# Patient Record
Sex: Male | Born: 1955
Health system: Southern US, Community
[De-identification: ages and names within clinical notes are randomized; demographics above are authoritative.]

## PROBLEM LIST (undated history)

## (undated) DIAGNOSIS — G4730 Sleep apnea, unspecified: Secondary | ICD-10-CM

## (undated) DIAGNOSIS — F32A Depression, unspecified: Secondary | ICD-10-CM

## (undated) DIAGNOSIS — G5603 Carpal tunnel syndrome, bilateral upper limbs: Secondary | ICD-10-CM

## (undated) DIAGNOSIS — M1990 Unspecified osteoarthritis, unspecified site: Secondary | ICD-10-CM

## (undated) DIAGNOSIS — E785 Hyperlipidemia, unspecified: Secondary | ICD-10-CM

## (undated) DIAGNOSIS — I5189 Other ill-defined heart diseases: Secondary | ICD-10-CM

## (undated) DIAGNOSIS — I10 Essential (primary) hypertension: Secondary | ICD-10-CM

## (undated) DIAGNOSIS — M654 Radial styloid tenosynovitis [de Quervain]: Secondary | ICD-10-CM

## (undated) DIAGNOSIS — K219 Gastro-esophageal reflux disease without esophagitis: Secondary | ICD-10-CM

## (undated) DIAGNOSIS — J45909 Unspecified asthma, uncomplicated: Secondary | ICD-10-CM

## (undated) DIAGNOSIS — G5600 Carpal tunnel syndrome, unspecified upper limb: Secondary | ICD-10-CM

## (undated) DIAGNOSIS — J4520 Mild intermittent asthma, uncomplicated: Secondary | ICD-10-CM

## (undated) DIAGNOSIS — I517 Cardiomegaly: Secondary | ICD-10-CM

## (undated) DIAGNOSIS — I6529 Occlusion and stenosis of unspecified carotid artery: Secondary | ICD-10-CM

## (undated) DIAGNOSIS — E119 Type 2 diabetes mellitus without complications: Secondary | ICD-10-CM

## (undated) DIAGNOSIS — I209 Angina pectoris, unspecified: Secondary | ICD-10-CM

## (undated) DIAGNOSIS — G4733 Obstructive sleep apnea (adult) (pediatric): Secondary | ICD-10-CM

## (undated) DIAGNOSIS — I739 Peripheral vascular disease, unspecified: Secondary | ICD-10-CM

## (undated) DIAGNOSIS — M4802 Spinal stenosis, cervical region: Secondary | ICD-10-CM

## (undated) DIAGNOSIS — R011 Cardiac murmur, unspecified: Secondary | ICD-10-CM

## (undated) DIAGNOSIS — I639 Cerebral infarction, unspecified: Secondary | ICD-10-CM

## (undated) DIAGNOSIS — I38 Endocarditis, valve unspecified: Secondary | ICD-10-CM

## (undated) DIAGNOSIS — T7840XA Allergy, unspecified, initial encounter: Secondary | ICD-10-CM

## (undated) DIAGNOSIS — I8390 Asymptomatic varicose veins of unspecified lower extremity: Secondary | ICD-10-CM

## (undated) DIAGNOSIS — F329 Major depressive disorder, single episode, unspecified: Secondary | ICD-10-CM

## (undated) DIAGNOSIS — M5416 Radiculopathy, lumbar region: Secondary | ICD-10-CM

## (undated) HISTORY — DX: Depression, unspecified: F32.A

## (undated) HISTORY — PX: JOINT REPLACEMENT: SHX530

## (undated) HISTORY — DX: Allergy, unspecified, initial encounter: T78.40XA

## (undated) HISTORY — PX: KNEE ARTHROSCOPY WITH MEDIAL MENISECTOMY: SHX5651

## (undated) HISTORY — PX: ENDOVENOUS ABLATION SAPHENOUS VEIN W/ LASER: SUR449

## (undated) HISTORY — DX: Major depressive disorder, single episode, unspecified: F32.9

## (undated) HISTORY — PX: CHOLECYSTECTOMY: SHX55

## (undated) HISTORY — PX: COLONOSCOPY: SHX174

## (undated) HISTORY — DX: Hyperlipidemia, unspecified: E78.5

---

## 1986-02-20 HISTORY — PX: CHOLECYSTECTOMY: SHX55

## 2004-06-11 ENCOUNTER — Emergency Department: Payer: Self-pay | Admitting: Unknown Physician Specialty

## 2005-01-03 ENCOUNTER — Ambulatory Visit: Payer: Self-pay

## 2008-02-21 HISTORY — PX: COLONOSCOPY: SHX174

## 2008-04-16 ENCOUNTER — Ambulatory Visit: Payer: Self-pay | Admitting: Gastroenterology

## 2009-09-17 ENCOUNTER — Ambulatory Visit: Payer: Self-pay

## 2009-10-04 ENCOUNTER — Ambulatory Visit: Payer: Self-pay | Admitting: Unknown Physician Specialty

## 2009-10-05 ENCOUNTER — Ambulatory Visit: Payer: Self-pay | Admitting: Unknown Physician Specialty

## 2009-10-05 HISTORY — PX: CHONDROPLASTY: SHX5177

## 2010-01-19 ENCOUNTER — Ambulatory Visit: Payer: Self-pay | Admitting: Family Medicine

## 2013-04-10 ENCOUNTER — Ambulatory Visit: Payer: Self-pay | Admitting: Internal Medicine

## 2013-04-11 ENCOUNTER — Ambulatory Visit: Payer: Self-pay | Admitting: Internal Medicine

## 2013-08-14 DIAGNOSIS — M1711 Unilateral primary osteoarthritis, right knee: Secondary | ICD-10-CM | POA: Insufficient documentation

## 2013-08-14 DIAGNOSIS — M5116 Intervertebral disc disorders with radiculopathy, lumbar region: Secondary | ICD-10-CM | POA: Insufficient documentation

## 2013-10-31 ENCOUNTER — Ambulatory Visit: Payer: Self-pay | Admitting: General Practice

## 2014-07-21 DIAGNOSIS — M7540 Impingement syndrome of unspecified shoulder: Secondary | ICD-10-CM | POA: Insufficient documentation

## 2014-07-21 DIAGNOSIS — M7520 Bicipital tendinitis, unspecified shoulder: Secondary | ICD-10-CM | POA: Insufficient documentation

## 2014-07-28 ENCOUNTER — Encounter: Payer: Self-pay | Admitting: *Deleted

## 2014-08-10 ENCOUNTER — Ambulatory Visit
Payer: No Typology Code available for payment source | Admitting: Student in an Organized Health Care Education/Training Program

## 2014-08-10 ENCOUNTER — Ambulatory Visit
Admission: RE | Admit: 2014-08-10 | Discharge: 2014-08-10 | Disposition: A | Discharge status: Home / Self Care | Payer: No Typology Code available for payment source | Source: Ambulatory Visit | Attending: Unknown Physician Specialty | Admitting: Unknown Physician Specialty

## 2014-08-10 ENCOUNTER — Encounter
Admission: RE | Disposition: A | Discharge status: Home / Self Care | Payer: Self-pay | Source: Ambulatory Visit | Attending: Unknown Physician Specialty

## 2014-08-10 DIAGNOSIS — I10 Essential (primary) hypertension: Secondary | ICD-10-CM | POA: Diagnosis not present

## 2014-08-10 DIAGNOSIS — G5600 Carpal tunnel syndrome, unspecified upper limb: Secondary | ICD-10-CM | POA: Diagnosis not present

## 2014-08-10 DIAGNOSIS — Z8249 Family history of ischemic heart disease and other diseases of the circulatory system: Secondary | ICD-10-CM | POA: Insufficient documentation

## 2014-08-10 DIAGNOSIS — M7542 Impingement syndrome of left shoulder: Secondary | ICD-10-CM | POA: Insufficient documentation

## 2014-08-10 DIAGNOSIS — G4730 Sleep apnea, unspecified: Secondary | ICD-10-CM | POA: Insufficient documentation

## 2014-08-10 DIAGNOSIS — Z79899 Other long term (current) drug therapy: Secondary | ICD-10-CM | POA: Diagnosis not present

## 2014-08-10 DIAGNOSIS — E119 Type 2 diabetes mellitus without complications: Secondary | ICD-10-CM | POA: Diagnosis not present

## 2014-08-10 DIAGNOSIS — Z9889 Other specified postprocedural states: Secondary | ICD-10-CM | POA: Insufficient documentation

## 2014-08-10 DIAGNOSIS — M19012 Primary osteoarthritis, left shoulder: Secondary | ICD-10-CM | POA: Diagnosis not present

## 2014-08-10 DIAGNOSIS — M25512 Pain in left shoulder: Secondary | ICD-10-CM | POA: Diagnosis present

## 2014-08-10 DIAGNOSIS — M7522 Bicipital tendinitis, left shoulder: Secondary | ICD-10-CM | POA: Diagnosis not present

## 2014-08-10 DIAGNOSIS — K219 Gastro-esophageal reflux disease without esophagitis: Secondary | ICD-10-CM | POA: Diagnosis not present

## 2014-08-10 DIAGNOSIS — Z7951 Long term (current) use of inhaled steroids: Secondary | ICD-10-CM | POA: Insufficient documentation

## 2014-08-10 DIAGNOSIS — Z7982 Long term (current) use of aspirin: Secondary | ICD-10-CM | POA: Diagnosis not present

## 2014-08-10 DIAGNOSIS — J45909 Unspecified asthma, uncomplicated: Secondary | ICD-10-CM | POA: Insufficient documentation

## 2014-08-10 HISTORY — DX: Sleep apnea, unspecified: G47.30

## 2014-08-10 HISTORY — DX: Asymptomatic varicose veins of unspecified lower extremity: I83.90

## 2014-08-10 HISTORY — DX: Type 2 diabetes mellitus without complications: E11.9

## 2014-08-10 HISTORY — DX: Essential (primary) hypertension: I10

## 2014-08-10 HISTORY — DX: Unspecified asthma, uncomplicated: J45.909

## 2014-08-10 HISTORY — DX: Gastro-esophageal reflux disease without esophagitis: K21.9

## 2014-08-10 HISTORY — PX: SHOULDER ARTHROSCOPY: SHX128

## 2014-08-10 HISTORY — DX: Carpal tunnel syndrome, unspecified upper limb: G56.00

## 2014-08-10 LAB — GLUCOSE, CAPILLARY
GLUCOSE-CAPILLARY: 167 mg/dL — AB (ref 65–99)
Glucose-Capillary: 164 mg/dL — ABNORMAL HIGH (ref 65–99)

## 2014-08-10 SURGERY — ARTHROSCOPY, SHOULDER
Anesthesia: General | Laterality: Left | Wound class: Clean

## 2014-08-10 MED ORDER — OXYCODONE HCL 5 MG PO TABS: 5.0000 mg | ORAL_TABLET | Freq: Once | ORAL | Status: DC | PRN

## 2014-08-10 MED ORDER — LACTATED RINGERS IV SOLN: INTRAVENOUS | Status: DC

## 2014-08-10 MED ORDER — ROPIVACAINE HCL 5 MG/ML IJ SOLN
INTRAMUSCULAR | Status: DC | PRN
  Administered 2014-08-10: 40 mL via PERINEURAL

## 2014-08-10 MED ORDER — DEXAMETHASONE SODIUM PHOSPHATE 4 MG/ML IJ SOLN
INTRAMUSCULAR | Status: DC | PRN
  Administered 2014-08-10: 4 mg via INTRAVENOUS
  Administered 2014-08-10: 4 mg via PERINEURAL

## 2014-08-10 MED ORDER — NORCO 5-325 MG PO TABS: 1.0000 | ORAL_TABLET | Freq: Four times a day (QID) | ORAL | Status: DC | PRN

## 2014-08-10 MED ORDER — ACETAMINOPHEN 325 MG PO TABS: 325.0000 mg | ORAL_TABLET | ORAL | Status: DC | PRN

## 2014-08-10 MED ORDER — LIDOCAINE HCL (CARDIAC) 20 MG/ML IV SOLN
INTRAVENOUS | Status: DC | PRN
Start: 2014-08-10 — End: 2014-08-10
  Administered 2014-08-10: 50 mg via INTRATRACHEAL

## 2014-08-10 MED ORDER — FENTANYL CITRATE (PF) 100 MCG/2ML IJ SOLN
INTRAMUSCULAR | Status: DC | PRN
  Administered 2014-08-10: 100 ug via INTRAVENOUS

## 2014-08-10 MED ORDER — ONDANSETRON HCL 4 MG/2ML IJ SOLN: 4.0000 mg | Freq: Once | INTRAMUSCULAR | Status: DC | PRN

## 2014-08-10 MED ORDER — LACTATED RINGERS IV SOLN
INTRAVENOUS | Status: DC
  Administered 2014-08-10 (×2): via INTRAVENOUS

## 2014-08-10 MED ORDER — OXYCODONE HCL 5 MG/5ML PO SOLN: 5.0000 mg | Freq: Once | ORAL | Status: DC | PRN

## 2014-08-10 MED ORDER — GLYCOPYRROLATE 0.2 MG/ML IJ SOLN
INTRAMUSCULAR | Status: DC | PRN
  Administered 2014-08-10: 0.1 mg via INTRAVENOUS

## 2014-08-10 MED ORDER — ONDANSETRON HCL 4 MG/2ML IJ SOLN
INTRAMUSCULAR | Status: DC | PRN
  Administered 2014-08-10: 4 mg via INTRAVENOUS

## 2014-08-10 MED ORDER — CEFAZOLIN SODIUM-DEXTROSE 2-3 GM-% IV SOLR
INTRAVENOUS | Status: DC | PRN
  Administered 2014-08-10: 2 g via INTRAVENOUS

## 2014-08-10 MED ORDER — PROPOFOL 10 MG/ML IV BOLUS
INTRAVENOUS | Status: DC | PRN
  Administered 2014-08-10: 170 mg via INTRAVENOUS

## 2014-08-10 MED ORDER — MIDAZOLAM HCL 5 MG/5ML IJ SOLN
INTRAMUSCULAR | Status: DC | PRN
  Administered 2014-08-10 (×2): 2 mg via INTRAVENOUS

## 2014-08-10 MED ORDER — ACETAMINOPHEN 160 MG/5ML PO SOLN: 325.0000 mg | ORAL | Status: DC | PRN

## 2014-08-10 MED ORDER — LACTATED RINGERS IV SOLN
INTRAVENOUS | Status: DC | PRN
  Administered 2014-08-10: 6000 mL

## 2014-08-10 MED ORDER — FENTANYL CITRATE (PF) 100 MCG/2ML IJ SOLN: 25.0000 ug | INTRAMUSCULAR | Status: DC | PRN

## 2014-08-10 MED ORDER — EPINEPHRINE HCL 1 MG/ML IJ SOLN: INTRAMUSCULAR | Status: DC | PRN

## 2014-08-10 MED ORDER — EPHEDRINE SULFATE 50 MG/ML IJ SOLN
INTRAMUSCULAR | Status: DC | PRN
  Administered 2014-08-10: 5 mg via INTRAVENOUS
  Administered 2014-08-10: 10 mg via INTRAVENOUS
  Administered 2014-08-10: 5 mg via INTRAVENOUS

## 2014-08-10 SURGICAL SUPPLY — 72 items
ADAPTER IRRIG TUBE 2 SPIKE SOL (ADAPTER) ×3 IMPLANT
ARTHROWAND PARAGON T2 (SURGICAL WAND)
BLADE SURG 15 STRL LF DISP TIS (BLADE) IMPLANT
BLADE SURG 15 STRL SS (BLADE)
BUR BR 5.5 12 FLUTE (BURR) IMPLANT
BUR HOODED 3.0 ABRASION (BURR) IMPLANT
BUR RADIUS 4.0X18.5 (BURR) ×3 IMPLANT
BUR RADIUS 5.5 (BURR) IMPLANT
CANNULA 8.5X75 THRED (CANNULA) IMPLANT
CANNULA SHAVER 8MMX76MM (CANNULA) IMPLANT
CAP LOCK ULTRA CANNULA (MISCELLANEOUS) ×3 IMPLANT
CUTTER CANN W/HOLE 4.5 (CUTTER) IMPLANT
CUTTER SLOTTED WHISKER 4.0 (BURR) ×3 IMPLANT
DRAPE STERI 35X30 U-POUCH (DRAPES) ×3 IMPLANT
DURAPREP 26ML APPLICATOR (WOUND CARE) ×3 IMPLANT
GAUZE SPONGE 4X4 12PLY STRL (GAUZE/BANDAGES/DRESSINGS) ×3 IMPLANT
GLOVE BIO SURGEON STRL SZ7.5 (GLOVE) ×3 IMPLANT
GLOVE BIO SURGEON STRL SZ8 (GLOVE) ×3 IMPLANT
GLOVE INDICATOR 8.0 STRL GRN (GLOVE) ×3 IMPLANT
GOWN STRL REIN 2XL LVL4 (GOWN DISPOSABLE) IMPLANT
GOWN STRL REUS W/ TWL LRG LVL3 (GOWN DISPOSABLE) ×1 IMPLANT
GOWN STRL REUS W/TWL LRG LVL3 (GOWN DISPOSABLE) ×2
IV LACTATED RINGER IRRG 3000ML (IV SOLUTION) ×8
IV LR IRRIG 3000ML ARTHROMATIC (IV SOLUTION) ×4 IMPLANT
KIT SHOULDER TRACTION (DRAPES) ×3 IMPLANT
MANIFOLD 4PT FOR NEPTUNE1 (MISCELLANEOUS) ×3 IMPLANT
NEEDLE 18GX1X1/2 (RX/OR ONLY) (NEEDLE) ×3 IMPLANT
NEEDLE MAYO CATGUT SZ 1.5 (NEEDLE)
NEEDLE MAYO CATGUT SZ 2 (NEEDLE) IMPLANT
NEEDLE SPNL 18GX3.5 QUINCKE PK (NEEDLE) IMPLANT
PACK ARTHROSCOPY SHOULDER (MISCELLANEOUS) ×3 IMPLANT
PAD GROUND ADULT SPLIT (MISCELLANEOUS) ×3 IMPLANT
PASSER SUT CAPTURE FIRST (SUTURE) IMPLANT
SET TUBE SUCT SHAVER OUTFL 24K (TUBING) ×3 IMPLANT
SLING ARM M TX990204 (SOFTGOODS) ×3 IMPLANT
SOL PREP PVP 2OZ (MISCELLANEOUS) ×3
SOLUTION PREP PVP 2OZ (MISCELLANEOUS) ×1 IMPLANT
STAPLER SKIN PROX 35W (STAPLE) IMPLANT
SUT ETHIBOND NAB CT1 #1 30IN (SUTURE) IMPLANT
SUT ETHILON 3-0 FS-10 30 BLK (SUTURE) ×3
SUT MAGNUM WIRE 2 (SUTURE) IMPLANT
SUT PDS AB 1 CT1 27 (SUTURE) IMPLANT
SUT PDSII 0 (SUTURE) IMPLANT
SUT PERFECTPASSER WHITE CART (SUTURE) IMPLANT
SUT PROLENE 2 0 CT2 30 (SUTURE) IMPLANT
SUT SMART STITCH CARTRIDGE (SUTURE) IMPLANT
SUT TICRON 2-0 30IN 311381 (SUTURE) IMPLANT
SUT VIC AB 0 CT1 36 (SUTURE) IMPLANT
SUT VIC AB 0 CT2 27 (SUTURE) IMPLANT
SUT VIC AB 2-0 CT1 27 (SUTURE)
SUT VIC AB 2-0 CT1 TAPERPNT 27 (SUTURE) IMPLANT
SUT VIC AB 2-0 CT2 27 (SUTURE) IMPLANT
SUT VIC AB 2-0 SH 27 (SUTURE)
SUT VIC AB 2-0 SH 27XBRD (SUTURE) IMPLANT
SUT VIC AB 3-0 SH 27 (SUTURE)
SUT VIC AB 3-0 SH 27X BRD (SUTURE) IMPLANT
SUTURE EHLN 3-0 FS-10 30 BLK (SUTURE) ×1 IMPLANT
SUTURE MAGNUM WIRE 2X48 BLK (SUTURE) IMPLANT
SYRINGE 10CC LL (SYRINGE) ×3 IMPLANT
TAPE MICROFOAM 4IN (TAPE) ×3 IMPLANT
TUBING ARTHRO INFLOW-ONLY STRL (TUBING) ×3 IMPLANT
WAND 30 DEG SABER W/CORD (SURGICAL WAND) IMPLANT
WAND ARTHRO PARAGON T2 (SURGICAL WAND) IMPLANT
WAND COVAC 50 IFS (MISCELLANEOUS) IMPLANT
WAND COVATOR 20 (MISCELLANEOUS) IMPLANT
WAND HAND CNTRL MULTIVAC 50 (MISCELLANEOUS) IMPLANT
WAND HAND CNTRL MULTIVAC 90 (MISCELLANEOUS) IMPLANT
WAND MEGAVAC 90 (MISCELLANEOUS) ×3 IMPLANT
WAND TENDON TOPAZ 0 ANGL (MISCELLANEOUS) IMPLANT
WAND TOPAZ EPF  WAS Q (MISCELLANEOUS)
WAND TOPAZ EPF WAS Q (MISCELLANEOUS) IMPLANT
WIRE MAGNUM (SUTURE) IMPLANT

## 2014-08-10 NOTE — Anesthesia Preprocedure Evaluation (Signed)
Anesthesia Evaluation  Patient identified by MRN, date of birth, ID band  Reviewed: Allergy & Precautions, H&P , NPO status , Patient's Chart, lab work & pertinent test results  Airway Mallampati: II  TM Distance: >3 FB Neck ROM: full    Dental no notable dental hx.    Pulmonary asthma , sleep apnea ,    Pulmonary exam normal       Cardiovascular hypertension, On Medications Rhythm:regular Rate:Normal     Neuro/Psych    GI/Hepatic GERD-  Medicated,  Endo/Other  diabetes  Renal/GU      Musculoskeletal   Abdominal   Peds  Hematology   Anesthesia Other Findings   Reproductive/Obstetrics                             Anesthesia Physical Anesthesia Plan  ASA: II  Anesthesia Plan: General LMA   Post-op Pain Management: MAC Combined w/ Regional for Post-op pain   Induction:   Airway Management Planned:   Additional Equipment:   Intra-op Plan:   Post-operative Plan:   Informed Consent: I have reviewed the patients History and Physical, chart, labs and discussed the procedure including the risks, benefits and alternatives for the proposed anesthesia with the patient or authorized representative who has indicated his/her understanding and acceptance.     Plan Discussed with: CRNA  Anesthesia Plan Comments:         Anesthesia Quick Evaluation

## 2014-08-10 NOTE — Op Note (Signed)
08/10/2014  11:45 AM  Patient:   Raymond [name]  Pre-Op Diagnosis:   M75.22 BICEPITAL TENDONITIS LEFT SHOULDER M75.42 IMPINGEMENT SYNDROME LEFT SHOULDER  Postoperative diagnosis: degenerative arthritis, biceps tendinopathy and loose body left shoulder.  Procedure:  Arthroscopic debridement of chondral lesions left shoulder along with arthroscopic removal of loose body and arthroscopic release of the long head of the biceps tendon  Anesthesia: General endotracheal with interscalene block placed preoperatively by the anesthesiologist.  Findings: As above.  Complications: None  Estimated blood loss: negligable  Tourniquet time: None  Drains: None  Closure: sutures   Brief clinical note:  The patient's symptoms have progressed despite medications, activity modification, etc. The patient's history and examination are consistent with  biceps tendinopathy with possible rotator cuff tear. These findings were confirmed by MRI scan. The patient presents at this time for definitive management of these shoulder symptoms.  Procedure: The patient was brought into the operating room and placed in the supine position. The patient then underwent general endotracheal intubation and anesthesia before being repositioned in the lateral decubitus position. The  left shoulder and upper extremity were prepped and draped in usual fashion. 2 g of Kefzol  was administered IV. A timeout was performed . A posterior portal was created and the glenohumeral joint thoroughly inspected revealing  Grade 3 glenohumeral chondral changes along with a loose body and fraying of the long head of the biceps tendon. An anterior portal was created. The labrum and rotator cuff were further probed. The ArthroCare wand was inserted and used to obtain hemostasis as well as to perform a limited synovectomy.The biceps tendon was released from the labrum using an ArthroCare wand.  The small loose body was removed  from the glenohumeral joint.  I then debrided the glenohumeral chondral changes with a turbo whisker.  The scope was repositioned through the posterior portal into the subacromial space. A separate lateral portal was created using an outside-in technique. The 3.5 full-radius resector was introduced and used to perform a subtotal bursectomy. The ArthroCare wand was then inserted and used to further debride the subacromial space. I carefully inspected the rotator cuff with a probe. No obvious rotator cuff tear visualized. No significant anterior subacromial spurring was observed.. At this time the instruments were  removed from the subacromial space.   The portal sites also were closed using 4-O nylon sutures. A sterile bulky dressing was applied to the shoulder followed by a sling.The patient was then awakened, extubated, and returned to the recovery room in satisfactory condition after tolerating the procedure well.  Blood loss was negligible.

## 2014-08-10 NOTE — Discharge Instructions (Signed)
General Anesthesia, Care After Refer to this sheet in the next few weeks. These instructions provide you with information on caring for yourself after your procedure. Your health care provider may also give you more specific instructions. Your treatment has been planned according to current medical practices, but problems sometimes occur. Call your health care provider if you have any problems or questions after your procedure. WHAT TO EXPECT AFTER THE PROCEDURE After the procedure, it is typical to experience:  Sleepiness.  Nausea and vomiting. HOME CARE INSTRUCTIONS  For the first 24 hours after general anesthesia:  Have a responsible person with you.  Do not drive a car. If you are alone, do not take public transportation.  Do not drink alcohol.  Do not take medicine that has not been prescribed by your health care provider.  Do not sign important papers or make important decisions.  You may resume a normal diet and activities as directed by your health care provider.  Change bandages (dressings) as directed.  If you have questions or problems that seem related to general anesthesia, call the hospital and ask for the anesthetist or anesthesiologist on call. SEEK MEDICAL CARE IF:  You have nausea and vomiting that continue the day after anesthesia.  You develop a rash. SEEK IMMEDIATE MEDICAL CARE IF:   You have difficulty breathing.  You have chest pain.  You have any allergic problems. Document Released: 05/15/2000 Document Revised: 02/11/2013 Document Reviewed: 08/22/2012 [facility] Patient Information 2015 [place], [place]. This information is not intended to replace advice given to you by your health care provider. Make sure you discuss any questions you have with your health care provider. General Anesthesia, Care After Refer to this sheet in the next few weeks. These instructions provide you with information on caring for yourself after your procedure. Your health care  provider may also give you more specific instructions. Your treatment has been planned according to current medical practices, but problems sometimes occur. Call your health care provider if you have any problems or questions after your procedure. WHAT TO EXPECT AFTER THE PROCEDURE After the procedure, it is typical to experience:  Sleepiness.  Nausea and vomiting. HOME CARE INSTRUCTIONS  For the first 24 hours after general anesthesia:  Have a responsible person with you.  Do not drive a car. If you are alone, do not take public transportation.  Do not drink alcohol.  Do not take medicine that has not been prescribed by your health care provider.  Do not sign important papers or make important decisions.  You may resume a normal diet and activities as directed by your health care provider.  Change bandages (dressings) as directed.  If you have questions or problems that seem related to general anesthesia, call the hospital and ask for the anesthetist or anesthesiologist on call. SEEK MEDICAL CARE IF:  You have nausea and vomiting that continue the day after anesthesia.  You develop a rash. SEEK IMMEDIATE MEDICAL CARE IF:   You have difficulty breathing.  You have chest pain.  You have any allergic problems.  Document Released: 05/15/2000 Document Revised: 02/11/2013 Document Reviewed: 08/22/2012 [facility] Patient Information 2015 [place], [place]. This information is not intended to replace advice given to you by your health care provider. Make sure you discuss any questions you have with your health care provider. Diet: As you were doing prior to hospitalization   Shower:  May shower but keep the wounds dry, use an occlusive plastic wrap or extremity protector. NO SOAKING IN  TUB.   Dressing:  You may remove your dressing 3 days after surgery. Then apply waterproof bandaids and change them after showering.   Activity:  Increase activity slowly as tolerated. Can drive  when comfortable.    Sling: D/C when comfortable    RTC: 1 week  Ice pack as needed   To prevent constipation: you may use a stool softener such as - Miralax (over the counter) for constipation as needed.    To prevent venous clotting Take one 81 mg. ASA tablet  2X per day for about 2 weeks post surgery.  Precautions:  If you experience chest pain or shortness of breath - call 911 immediately for transfer to the hospital emergency department!!  If you develop a fever greater that 101 F, purulent drainage from wound, increased redness or drainage from wound, or calf pain -- Call the office at [phone]                                             Follow- Up Appointment:  Please call for an appointment to be seen in 1 wk.

## 2014-08-10 NOTE — Transfer of Care (Signed)
Immediate Anesthesia Transfer of Care Note  Patient: ERCELL RAZON  Procedure(s) Performed: Procedure(s): ARTHROSCOPY SHOULDER WITH LIMITED DEBRIDEMENT, REMOVAL OF LOOSE BODY AND RELEASE OF LONG END BICEPS TENDON (Left)  Patient Location: PACU  Anesthesia Type: General LMA  Level of Consciousness: awake, alert  and patient cooperative  Airway and Oxygen Therapy: Patient Spontanous Breathing and Patient connected to supplemental oxygen  Post-op Assessment: Post-op Vital signs reviewed, Patient's Cardiovascular Status Stable, Respiratory Function Stable, Patent Airway and No signs of Nausea or vomiting  Post-op Vital Signs: Reviewed and stable  Complications: No apparent anesthesia complications

## 2014-08-10 NOTE — Anesthesia Postprocedure Evaluation (Signed)
  Anesthesia Post-op Note  Patient: Raymond [name]  Procedure(s) Performed: Procedure(s): ARTHROSCOPY SHOULDER WITH LIMITED DEBRIDEMENT, REMOVAL OF LOOSE BODY AND RELEASE OF LONG END BICEPS TENDON (Left)  Anesthesia type:General LMA  Patient location: PACU  Post pain: Pain level controlled  Post assessment: Post-op Vital signs reviewed, Patient's Cardiovascular Status Stable, Respiratory Function Stable, Patent Airway and No signs of Nausea or vomiting  Post vital signs: Reviewed and stable  Last Vitals:  Filed Vitals:   08/10/14 1145  BP:   Pulse: 65  Temp: 36.7 C  Resp: 13    Level of consciousness: awake, alert  and patient cooperative  Complications: No apparent anesthesia complications

## 2014-08-10 NOTE — H&P (Signed)
  H and P reviewed. No changes. Uploaded at later date. 

## 2014-08-10 NOTE — Anesthesia Procedure Notes (Addendum)
Anesthesia Regional Block:  Interscalene brachial plexus block  Pre-Anesthetic Checklist: ,, timeout performed, Correct Patient, Correct Site, Correct Laterality, Correct Procedure, Correct Position, site marked, Risks and benefits discussed,  Surgical consent,  Pre-op evaluation,  At surgeon's request and post-op pain management  Laterality: Left  Prep: chloraprep       Needles:  Injection technique: Single-shot  Needle Type: Stimiplex     Needle Length: 10cm 10 cm Needle Gauge: 21 and 21 G    Additional Needles:  Procedures: ultrasound guided (picture in chart) Interscalene brachial plexus block Narrative:  Start time: 08/10/2014 8:51 AM End time: 08/10/2014 8:58 AM Injection made incrementally with aspirations every 5 mL.  Performed by: Personally  Anesthesiologist: Ronelle Nigh  Additional Notes: Functioning IV was confirmed and monitors applied. Ultrasound guidance: relevant anatomy identified, needle position confirmed, local anesthetic spread visualized around nerve(s)., vascular puncture avoided.  Image printed for medical record.  Negative aspiration and no paresthesias; incremental administration of local anesthetic. The patient tolerated the procedure well. Vitals signes recorded in RN notes.   Procedure Name: LMA Insertion Date/Time: 08/10/2014 9:42 AM Performed by: Mayme Genta Pre-anesthesia Checklist: Patient identified, Emergency Drugs available, Suction available, Timeout performed and Patient being monitored Patient Re-evaluated:Patient Re-evaluated prior to inductionOxygen Delivery Method: Circle system utilized Preoxygenation: Pre-oxygenation with 100% oxygen Intubation Type: IV induction LMA: LMA inserted LMA Size: 4.0 Number of attempts: 1 Placement Confirmation: positive ETCO2 and breath sounds checked- equal and bilateral Tube secured with: Tape

## 2014-08-10 NOTE — Progress Notes (Signed)
Assisted Mike Stella ANMD with left, interscalene  block. Side rails up, monitors on throughout procedure. See vital signs in flow sheet. Tolerated Procedure well.  

## 2014-08-11 ENCOUNTER — Encounter: Payer: Self-pay | Admitting: Unknown Physician Specialty

## 2014-12-02 ENCOUNTER — Other Ambulatory Visit: Payer: Self-pay | Admitting: Family Medicine

## 2014-12-02 DIAGNOSIS — G629 Polyneuropathy, unspecified: Secondary | ICD-10-CM

## 2014-12-02 DIAGNOSIS — I10 Essential (primary) hypertension: Secondary | ICD-10-CM

## 2014-12-03 ENCOUNTER — Other Ambulatory Visit: Payer: Self-pay

## 2014-12-03 MED ORDER — LIRAGLUTIDE 18 MG/3ML ~~LOC~~ SOPN: 1.8000 mL | PEN_INJECTOR | SUBCUTANEOUS | Status: DC

## 2014-12-04 ENCOUNTER — Encounter: Payer: Self-pay | Admitting: Family Medicine

## 2014-12-04 ENCOUNTER — Ambulatory Visit (INDEPENDENT_AMBULATORY_CARE_PROVIDER_SITE_OTHER): Payer: No Typology Code available for payment source | Admitting: Family Medicine

## 2014-12-04 ENCOUNTER — Ambulatory Visit
Admission: RE | Admit: 2014-12-04 | Discharge: 2014-12-04 | Disposition: A | Discharge status: Home / Self Care | Payer: PRIVATE HEALTH INSURANCE | Source: Ambulatory Visit | Attending: Family Medicine | Admitting: Family Medicine

## 2014-12-04 VITALS — BP 140/80 | HR 68 | Ht 73.0 in | Wt 292.0 lb

## 2014-12-04 DIAGNOSIS — F3341 Major depressive disorder, recurrent, in partial remission: Secondary | ICD-10-CM

## 2014-12-04 DIAGNOSIS — E119 Type 2 diabetes mellitus without complications: Secondary | ICD-10-CM

## 2014-12-04 DIAGNOSIS — E785 Hyperlipidemia, unspecified: Secondary | ICD-10-CM | POA: Diagnosis not present

## 2014-12-04 DIAGNOSIS — S76012A Strain of muscle, fascia and tendon of left hip, initial encounter: Secondary | ICD-10-CM | POA: Insufficient documentation

## 2014-12-04 DIAGNOSIS — K219 Gastro-esophageal reflux disease without esophagitis: Secondary | ICD-10-CM | POA: Diagnosis not present

## 2014-12-04 DIAGNOSIS — I10 Essential (primary) hypertension: Secondary | ICD-10-CM

## 2014-12-04 DIAGNOSIS — X58XXXA Exposure to other specified factors, initial encounter: Secondary | ICD-10-CM | POA: Insufficient documentation

## 2014-12-04 DIAGNOSIS — M5116 Intervertebral disc disorders with radiculopathy, lumbar region: Secondary | ICD-10-CM | POA: Diagnosis not present

## 2014-12-04 DIAGNOSIS — J301 Allergic rhinitis due to pollen: Secondary | ICD-10-CM

## 2014-12-04 DIAGNOSIS — G629 Polyneuropathy, unspecified: Secondary | ICD-10-CM | POA: Diagnosis not present

## 2014-12-04 MED ORDER — GABAPENTIN 300 MG PO CAPS: ORAL_CAPSULE | ORAL | Status: DC

## 2014-12-04 MED ORDER — HYDROCHLOROTHIAZIDE 25 MG PO TABS: 25.0000 mg | ORAL_TABLET | Freq: Every day | ORAL | Status: DC

## 2014-12-04 MED ORDER — LIRAGLUTIDE 18 MG/3ML ~~LOC~~ SOPN: 1.8000 mL | PEN_INJECTOR | SUBCUTANEOUS | Status: DC

## 2014-12-04 MED ORDER — BUPROPION HCL 75 MG PO TABS: 75.0000 mg | ORAL_TABLET | Freq: Every day | ORAL | Status: DC

## 2014-12-04 MED ORDER — SIMVASTATIN 40 MG PO TABS: 40.0000 mg | ORAL_TABLET | Freq: Every day | ORAL | Status: DC

## 2014-12-04 MED ORDER — AMLODIPINE BESYLATE 10 MG PO TABS: 10.0000 mg | ORAL_TABLET | Freq: Every day | ORAL | Status: DC

## 2014-12-04 MED ORDER — ESCITALOPRAM OXALATE 20 MG PO TABS: 20.0000 mg | ORAL_TABLET | Freq: Every day | ORAL | Status: DC

## 2014-12-04 MED ORDER — FLUTICASONE PROPIONATE 50 MCG/ACT NA SUSP: 2.0000 | Freq: Every day | NASAL | Status: DC

## 2014-12-04 MED ORDER — LOSARTAN POTASSIUM 50 MG PO TABS: ORAL_TABLET | ORAL | Status: DC

## 2014-12-04 MED ORDER — ESOMEPRAZOLE MAGNESIUM 40 MG PO CPDR: 40.0000 mg | DELAYED_RELEASE_CAPSULE | Freq: Every day | ORAL | Status: DC

## 2014-12-04 MED ORDER — METFORMIN HCL 500 MG PO TABS
500.0000 mg | ORAL_TABLET | Freq: Two times a day (BID) | ORAL | Status: DC
Start: 2014-12-04 — End: 2015-06-01

## 2014-12-04 NOTE — Progress Notes (Signed)
Name: Raymond [name]   MRN: [id number]    DOB: 10-Sep-1955   Date:12/04/2014       Progress Note  Subjective  Chief Complaint  Chief Complaint  Patient presents with  . Hypertension  . Hyperlipidemia  . Gastrophageal Reflux  . Depression  . Diabetes  . Hip Pain    Fell approx 1 week ago- L) hip pain  . Allergic Rhinitis   . Peripheral Neuropathy    Hypertension This is a chronic problem. The current episode started more than 1 year ago. The problem has been waxing and waning since onset. The problem is controlled. Associated symptoms include malaise/fatigue. Pertinent negatives include no anxiety, blurred vision, chest pain, headaches, neck pain, orthopnea, palpitations, peripheral edema, PND, shortness of breath or sweats. There are no known risk factors for coronary artery disease. Past treatments include calcium channel blockers, diuretics and angiotensin blockers. There are no compliance problems.  There is no history of angina, kidney disease, CAD/MI, CVA, heart failure, left ventricular hypertrophy, PVD, renovascular disease or retinopathy. There is no history of chronic renal disease.  Hyperlipidemia This is a chronic problem. The current episode started more than 1 year ago. The problem is controlled. Recent lipid tests were reviewed and are normal. He has no history of chronic renal disease, diabetes, hypothyroidism, liver disease, obesity or nephrotic syndrome. There are no known factors aggravating his hyperlipidemia. Pertinent negatives include no chest pain, focal sensory loss, focal weakness, leg pain, myalgias or shortness of breath. He is currently on no antihyperlipidemic treatment. The current treatment provides mild improvement of lipids. There are no compliance problems.  Risk factors for coronary artery disease include obesity, male sex, hypertension, dyslipidemia and diabetes mellitus.  Gastrophageal Reflux He reports no abdominal pain, no belching, no chest pain, no  choking, no coughing, no dysphagia, no early satiety, no globus sensation, no heartburn, no hoarse voice, no nausea, no sore throat, no stridor, no water brash or no wheezing. This is a chronic problem. The current episode started more than 1 year ago. The problem has been waxing and waning. The symptoms are aggravated by certain foods. Pertinent negatives include no anemia, fatigue, melena, muscle weakness, orthopnea or weight loss. There are no known risk factors. He has tried a PPI for the symptoms. The treatment provided mild relief.  Depression        This is a chronic problem.  The current episode started more than 1 year ago.   The onset quality is gradual.   The problem occurs intermittently.  The problem has been gradually improving since onset.  Associated symptoms include sad.  Associated symptoms include no decreased concentration, no fatigue, no helplessness, no hopelessness, does not have insomnia, not irritable, no appetite change, no myalgias, no headaches and no suicidal ideas.     The symptoms are aggravated by nothing.  Past treatments include SSRIs - Selective serotonin reuptake inhibitors (buptropion anxiety).  Compliance with treatment is good.  Past compliance problems include difficulty understanding directions.  Previous treatment provided mild relief.   Pertinent negatives include no hypothyroidism and no anxiety. Diabetes He presents for his follow-up diabetic visit. He has type 2 diabetes mellitus. His disease course has been fluctuating. Pertinent negatives for hypoglycemia include no confusion, dizziness, headaches, hunger, mood changes, nervousness/anxiousness, pallor, sleepiness, speech difficulty, sweats or tremors. Pertinent negatives for diabetes include no blurred vision, no chest pain, no fatigue, no foot paresthesias, no foot ulcerations, no polydipsia, no polyphagia, no polyuria, no visual change, no weakness  and no weight loss. Symptoms are stable. There are no diabetic  complications. Pertinent negatives for diabetic complications include no CVA, PVD or retinopathy. Risk factors for coronary artery disease include obesity. Current diabetic treatment includes oral agent (dual therapy). His weight is increasing steadily. He is following a generally healthy diet. He participates in exercise daily. His home blood glucose trend is fluctuating minimally. An ACE inhibitor/angiotensin II receptor blocker is being taken.  Hip Pain  The incident occurred 5 to 7 days ago. The incident occurred in the yard. The injury mechanism was a fall. The pain is present in the left hip. The quality of the pain is described as aching. The pain is mild. The pain has been fluctuating since onset. Associated symptoms include a loss of motion. Pertinent negatives include no inability to bear weight, loss of sensation, muscle weakness, numbness or tingling. He has tried acetaminophen for the symptoms. The treatment provided mild relief.    No problem-specific assessment & plan notes found for this encounter.   Past Medical History  Diagnosis Date  . Sleep apnea     has BiPAP -doesn't use  . Asthma     mild  . Diabetes mellitus without complication ([place])     Type II  . Hypertension     neg stress test 10 yrs ago - Dr Humphrey Rolls  . Varicose vein     treated by Dr. Delana Meyer  . GERD (gastroesophageal reflux disease)   . Carpal tunnel syndrome     bilateral  . Depression   . Allergy   . Hyperlipidemia     Past Surgical History  Procedure Laterality Date  . Cholecystectomy    . Knee arthroscopy with medial menisectomy Right     Partial medial and lateral menisectomy  . Chondroplasty  10/05/09    knee- shaving patellofemoral joint  . Endovenous ablation saphenous vein w/ laser    . Shoulder arthroscopy Left 08/10/2014    Procedure: ARTHROSCOPY SHOULDER WITH LIMITED DEBRIDEMENT, REMOVAL OF LOOSE BODY AND RELEASE OF LONG END BICEPS TENDON;  Surgeon: Leanor Kail, MD;  Location: [place];  Service: Orthopedics;  Laterality: Left;  . Colonoscopy  2009?    History reviewed. No pertinent family history.  Social History   Social History  . Marital Status: Married    Spouse Name: N/A  . Number of Children: N/A  . Years of Education: N/A   Occupational History  . Not on file.   Social History Main Topics  . Smoking status: Never Smoker   . Smokeless tobacco: Not on file  . Alcohol Use: Yes     Comment: rare  . Drug Use: Not on file  . Sexual Activity: Yes   Other Topics Concern  . Not on file   Social History Narrative    No Known Allergies   Review of Systems  Constitutional: Positive for malaise/fatigue. Negative for fever, chills, weight loss, appetite change and fatigue.  HENT: Negative for ear discharge, ear pain, hoarse voice and sore throat.   Eyes: Negative for blurred vision.  Respiratory: Negative for cough, sputum production, choking, shortness of breath and wheezing.   Cardiovascular: Negative for chest pain, palpitations, orthopnea, leg swelling and PND.  Gastrointestinal: Negative for heartburn, dysphagia, nausea, abdominal pain, diarrhea, constipation, blood in stool and melena.  Genitourinary: Negative for dysuria, urgency, frequency and hematuria.  Musculoskeletal: Positive for joint pain. Negative for myalgias, back pain, muscle weakness and neck pain.  Skin: Negative for pallor and rash.  Neurological: Negative for dizziness, tingling, tremors, sensory change, focal weakness, speech difficulty, weakness, numbness and headaches.  Endo/Heme/Allergies: Negative for environmental allergies, polydipsia and polyphagia. Does not bruise/bleed easily.  Psychiatric/Behavioral: Positive for depression. Negative for suicidal ideas, confusion and decreased concentration. The patient is not nervous/anxious and does not have insomnia.      Objective  Filed Vitals:   12/04/14 0805  BP: 140/80  Pulse: 68  Height: 6\' 1"  (1.854 m)   Weight: 292 lb (132.45 kg)    Physical Exam  Constitutional: He is oriented to person, place, and time and well-developed, well-nourished, and in no distress. He is not irritable.  HENT:  Head: Normocephalic.  Right Ear: External ear normal.  Left Ear: External ear normal.  Nose: Nose normal.  Mouth/Throat: Oropharynx is clear and moist.  Eyes: Conjunctivae and EOM are normal. Pupils are equal, round, and reactive to light. Right eye exhibits no discharge. Left eye exhibits no discharge. No scleral icterus.  Neck: Normal range of motion. Neck supple. No JVD present. No tracheal deviation present. No thyromegaly present.  Cardiovascular: Normal rate, regular rhythm, normal heart sounds and intact distal pulses.  Exam reveals no gallop and no friction rub.   No murmur heard. Pulmonary/Chest: Breath sounds normal. No respiratory distress. He has no wheezes. He has no rales.  Abdominal: Soft. Bowel sounds are normal. He exhibits no mass. There is no hepatosplenomegaly. There is no tenderness. There is no rebound, no guarding and no CVA tenderness.  Musculoskeletal: Normal range of motion. He exhibits tenderness. He exhibits no edema.  Left hip  Lymphadenopathy:    He has no cervical adenopathy.  Neurological: He is alert and oriented to person, place, and time. He has normal sensation, normal strength, normal reflexes and intact cranial nerves. No cranial nerve deficit.  Skin: Skin is warm. No rash noted.  Psychiatric: Mood and affect normal.      Assessment & Plan  Problem List Items Addressed This Visit      Digestive   GERD (gastroesophageal reflux disease)   Relevant Medications   esomeprazole (NEXIUM) 40 MG capsule     Nervous and Auditory   Neuritis or radiculitis due to rupture of lumbar intervertebral disc   Relevant Medications   buPROPion (WELLBUTRIN) 75 MG tablet   escitalopram (LEXAPRO) 20 MG tablet   gabapentin (NEURONTIN) 300 MG capsule    Other Visit Diagnoses     Essential hypertension    -  Primary    Relevant Medications    amLODipine (NORVASC) 10 MG tablet    hydrochlorothiazide (HYDRODIURIL) 25 MG tablet    losartan (COZAAR) 50 MG tablet    simvastatin (ZOCOR) 40 MG tablet    Other Relevant Orders    Renal Function Panel    Lipid Profile    Hyperlipidemia        Relevant Medications    amLODipine (NORVASC) 10 MG tablet    hydrochlorothiazide (HYDRODIURIL) 25 MG tablet    losartan (COZAAR) 50 MG tablet    simvastatin (ZOCOR) 40 MG tablet    Other Relevant Orders    Lipid Profile    Recurrent major depressive disorder, in partial remission (HCC)        Relevant Medications    buPROPion (WELLBUTRIN) 75 MG tablet    escitalopram (LEXAPRO) 20 MG tablet    Allergic rhinitis due to pollen        Relevant Medications    fluticasone (FLONASE) 50 MCG/ACT nasal spray    Hip strain,  left, initial encounter        Relevant Orders    DG HIP UNILAT WITH PELVIS 2-3 VIEWS LEFT (Completed)    Neuropathy (HCC)        Relevant Medications    gabapentin (NEURONTIN) 300 MG capsule    Type 2 diabetes mellitus without complication, unspecified long term insulin use status (HCC)        Relevant Medications    losartan (COZAAR) 50 MG tablet    metFORMIN (GLUCOPHAGE) 500 MG tablet    simvastatin (ZOCOR) 40 MG tablet    Liraglutide 18 MG/3ML SOPN         Dr. Deanna Jones [facility]  12/04/2014

## 2014-12-05 LAB — LIPID PANEL
Chol/HDL Ratio: 4 ratio units (ref 0.0–5.0)
Cholesterol, Total: 187 mg/dL (ref 100–199)
HDL: 47 mg/dL (ref >39)
LDL Calculated: 110 mg/dL — ABNORMAL HIGH (ref 0–99)
TRIGLYCERIDES: 149 mg/dL (ref 0–149)
VLDL Cholesterol Cal: 30 mg/dL (ref 5–40)

## 2014-12-05 LAB — RENAL FUNCTION PANEL
ALBUMIN: 4.3 g/dL (ref 3.6–4.8)
BUN/Creatinine Ratio: 17 (ref 10–22)
BUN: 13 mg/dL (ref 8–27)
CALCIUM: 9.5 mg/dL (ref 8.6–10.2)
CHLORIDE: 94 mmol/L — AB (ref 97–108)
CO2: 29 mmol/L (ref 18–29)
Creatinine, Ser: 0.76 mg/dL (ref 0.76–1.27)
GFR calc Af Amer: 115 mL/min/{1.73_m2} (ref >59)
GFR calc non Af Amer: 99 mL/min/{1.73_m2} (ref >59)
GLUCOSE: 180 mg/dL — AB (ref 65–99)
PHOSPHORUS: 3.3 mg/dL (ref 2.5–4.5)
POTASSIUM: 3.8 mmol/L (ref 3.5–5.2)
Sodium: 139 mmol/L (ref 134–144)

## 2014-12-18 ENCOUNTER — Other Ambulatory Visit: Payer: Self-pay

## 2014-12-21 ENCOUNTER — Other Ambulatory Visit: Payer: Self-pay

## 2014-12-21 DIAGNOSIS — E119 Type 2 diabetes mellitus without complications: Secondary | ICD-10-CM

## 2014-12-21 MED ORDER — LIRAGLUTIDE 18 MG/3ML ~~LOC~~ SOPN: 1.8000 mL | PEN_INJECTOR | SUBCUTANEOUS | Status: DC

## 2015-01-05 ENCOUNTER — Other Ambulatory Visit: Payer: Self-pay

## 2015-03-29 ENCOUNTER — Other Ambulatory Visit: Payer: Self-pay

## 2015-05-27 ENCOUNTER — Ambulatory Visit: Payer: Self-pay | Admitting: Physician Assistant

## 2015-05-27 ENCOUNTER — Encounter: Payer: Self-pay | Admitting: Physician Assistant

## 2015-05-27 VITALS — BP 140/76 | HR 74 | Temp 98.4°F

## 2015-05-27 DIAGNOSIS — L237 Allergic contact dermatitis due to plants, except food: Secondary | ICD-10-CM

## 2015-05-27 MED ORDER — DEXAMETHASONE SODIUM PHOSPHATE 10 MG/ML IJ SOLN
10.0000 mg | Freq: Once | INTRAMUSCULAR | Status: AC
  Administered 2015-05-27: 10 mg via INTRAMUSCULAR

## 2015-05-27 NOTE — Progress Notes (Signed)
S: was outside weed eating, now has poison ivy on ankles, hands, and forearm, no drainage from sites, no fever/chills, remainder ros neg  O: vitals wnl, nad, skin with small blisters on hand, ankles, and forearm, some in streaks typical of poison ivy, n/v intact  A: acute contact dermatitis, poison ivy  P: decadron 10mg  im given in clinic, if rash returns will rx prednisone

## 2015-06-01 ENCOUNTER — Other Ambulatory Visit: Payer: Self-pay | Admitting: Family Medicine

## 2015-07-02 ENCOUNTER — Other Ambulatory Visit: Payer: Self-pay

## 2015-07-02 DIAGNOSIS — E119 Type 2 diabetes mellitus without complications: Secondary | ICD-10-CM

## 2015-07-02 MED ORDER — LIRAGLUTIDE 18 MG/3ML ~~LOC~~ SOPN: 10.8000 mg | PEN_INJECTOR | SUBCUTANEOUS | Status: DC

## 2015-07-21 ENCOUNTER — Encounter: Payer: Self-pay | Admitting: Family Medicine

## 2015-07-21 ENCOUNTER — Ambulatory Visit (INDEPENDENT_AMBULATORY_CARE_PROVIDER_SITE_OTHER): Payer: Managed Care, Other (non HMO) | Admitting: Family Medicine

## 2015-07-21 VITALS — BP 130/82 | HR 60 | Ht 73.0 in | Wt 293.0 lb

## 2015-07-21 DIAGNOSIS — G6189 Other inflammatory polyneuropathies: Secondary | ICD-10-CM | POA: Diagnosis not present

## 2015-07-21 DIAGNOSIS — F329 Major depressive disorder, single episode, unspecified: Secondary | ICD-10-CM

## 2015-07-21 DIAGNOSIS — K219 Gastro-esophageal reflux disease without esophagitis: Secondary | ICD-10-CM

## 2015-07-21 DIAGNOSIS — J301 Allergic rhinitis due to pollen: Secondary | ICD-10-CM | POA: Diagnosis not present

## 2015-07-21 DIAGNOSIS — E782 Mixed hyperlipidemia: Secondary | ICD-10-CM | POA: Insufficient documentation

## 2015-07-21 DIAGNOSIS — I10 Essential (primary) hypertension: Secondary | ICD-10-CM | POA: Diagnosis not present

## 2015-07-21 DIAGNOSIS — E785 Hyperlipidemia, unspecified: Secondary | ICD-10-CM

## 2015-07-21 DIAGNOSIS — M170 Bilateral primary osteoarthritis of knee: Secondary | ICD-10-CM

## 2015-07-21 DIAGNOSIS — E119 Type 2 diabetes mellitus without complications: Secondary | ICD-10-CM

## 2015-07-21 DIAGNOSIS — Z0000 Encounter for general adult medical examination without abnormal findings: Secondary | ICD-10-CM

## 2015-07-21 DIAGNOSIS — F32A Depression, unspecified: Secondary | ICD-10-CM | POA: Insufficient documentation

## 2015-07-21 MED ORDER — AMLODIPINE BESYLATE 10 MG PO TABS: ORAL_TABLET | ORAL | Status: DC

## 2015-07-21 MED ORDER — LOSARTAN POTASSIUM 50 MG PO TABS: ORAL_TABLET | ORAL | Status: DC

## 2015-07-21 MED ORDER — BUPROPION HCL 75 MG PO TABS: ORAL_TABLET | ORAL | Status: DC

## 2015-07-21 MED ORDER — ESOMEPRAZOLE MAGNESIUM 40 MG PO CPDR: DELAYED_RELEASE_CAPSULE | ORAL | Status: DC

## 2015-07-21 MED ORDER — ESCITALOPRAM OXALATE 20 MG PO TABS: ORAL_TABLET | ORAL | Status: DC

## 2015-07-21 MED ORDER — SIMVASTATIN 40 MG PO TABS: ORAL_TABLET | ORAL | Status: DC

## 2015-07-21 MED ORDER — LIRAGLUTIDE 18 MG/3ML ~~LOC~~ SOPN: 10.8000 mg | PEN_INJECTOR | SUBCUTANEOUS | Status: DC

## 2015-07-21 MED ORDER — HYDROCHLOROTHIAZIDE 25 MG PO TABS: ORAL_TABLET | ORAL | Status: DC

## 2015-07-21 MED ORDER — FLUTICASONE PROPIONATE 50 MCG/ACT NA SUSP: 2.0000 | Freq: Every day | NASAL | Status: DC

## 2015-07-21 MED ORDER — MELOXICAM 7.5 MG PO TABS
7.5000 mg | ORAL_TABLET | Freq: Two times a day (BID) | ORAL | Status: DC
Start: 2015-07-21 — End: 2017-02-19

## 2015-07-21 MED ORDER — GABAPENTIN 300 MG PO CAPS: ORAL_CAPSULE | ORAL | Status: DC

## 2015-07-21 MED ORDER — METFORMIN HCL 500 MG PO TABS: ORAL_TABLET | ORAL | Status: DC

## 2015-07-21 NOTE — Addendum Note (Signed)
Addended by: Fredderick Severance on: 07/21/2015 09:34 AM   Modules accepted: Orders

## 2015-07-21 NOTE — Progress Notes (Signed)
Name: Raymond [name]   MRN: [id number]    DOB: 1955-03-04   Date:07/21/2015       Progress Note  Subjective  Chief Complaint  Chief Complaint  Patient presents with  . Annual Exam  . Diabetes  . Hyperlipidemia  . Hypertension  . Depression  . Gastroesophageal Reflux  . Peripheral Neuropathy    HPI Comments: Patient presents for annual physical exam.  Diabetes He presents for his follow-up diabetic visit. He has type 2 diabetes mellitus. There are no hypoglycemic associated symptoms. Pertinent negatives for hypoglycemia include no dizziness, headaches or nervousness/anxiousness. There are no diabetic associated symptoms. Pertinent negatives for diabetes include no blurred vision, no chest pain, no fatigue, no polydipsia and no weight loss. There are no hypoglycemic complications. Symptoms are stable. There are no diabetic complications. Pertinent negatives for diabetic complications include no CVA or PVD. Current diabetic treatment includes diet and oral agent (dual therapy). He is compliant with treatment all of the time. His weight is stable. He is following a generally healthy diet. He participates in exercise daily. His home blood glucose trend is fluctuating minimally. His breakfast blood glucose is taken between 8-9 am. His breakfast blood glucose range is generally 130-140 mg/dl. He does not see a podiatrist.Eye exam is not current.  Hyperlipidemia This is a new problem. The problem is controlled. Recent lipid tests were reviewed and are normal. He has no history of chronic renal disease, diabetes, hypothyroidism, liver disease, obesity or nephrotic syndrome. There are no known factors aggravating his hyperlipidemia. Pertinent negatives include no chest pain, focal weakness, myalgias or shortness of breath. Current antihyperlipidemic treatment includes statins. The current treatment provides moderate improvement of lipids. There are no compliance problems.  Risk factors for coronary artery  disease include dyslipidemia, diabetes mellitus, hypertension, male sex and stress.  Hypertension The problem is controlled. Pertinent negatives include no anxiety, blurred vision, chest pain, headaches, malaise/fatigue, neck pain, palpitations, PND or shortness of breath. Agents associated with hypertension include NSAIDs. There are no known risk factors for coronary artery disease. Past treatments include angiotensin blockers and calcium channel blockers. There are no compliance problems.  There is no history of angina, kidney disease, CAD/MI, CVA, heart failure, left ventricular hypertrophy, PVD or renovascular disease. There is no history of chronic renal disease or a hypertension causing med.  Depression      The patient presents with depression.  This is a chronic problem.  The current episode started more than 1 year ago.   The onset quality is gradual.   The problem has been gradually improving since onset.  Associated symptoms include no decreased concentration, no fatigue, does not have insomnia, no myalgias, no headaches and no suicidal ideas.  Previous treatment provided mild relief.  Past medical history includes depression.     Pertinent negatives include no hypothyroidism, no dementia, no anxiety, no post-traumatic stress disorder and no suicide attempts. Gastroesophageal Reflux He reports no abdominal pain, no belching, no chest pain, no choking, no coughing, no dysphagia, no heartburn, no hoarse voice, no nausea, no sore throat or no wheezing. This is a chronic problem. The problem has been waxing and waning. Pertinent negatives include no anemia, fatigue, melena, muscle weakness, orthopnea or weight loss. He has tried a PPI for the symptoms. The treatment provided mild relief.    No problem-specific assessment & plan notes found for this encounter.   Past Medical History  Diagnosis Date  . Sleep apnea     has BiPAP -doesn't  use  . Asthma     mild  . Diabetes mellitus without  complication ([place])     Type II  . Hypertension     neg stress test 10 yrs ago - Dr Humphrey Rolls  . Varicose vein     treated by Dr. Delana Meyer  . GERD (gastroesophageal reflux disease)   . Carpal tunnel syndrome     bilateral  . Depression   . Allergy   . Hyperlipidemia     Past Surgical History  Procedure Laterality Date  . Cholecystectomy    . Knee arthroscopy with medial menisectomy Right     Partial medial and lateral menisectomy  . Chondroplasty  10/05/09    knee- shaving patellofemoral joint  . Endovenous ablation saphenous vein w/ laser    . Shoulder arthroscopy Left 08/10/2014    Procedure: ARTHROSCOPY SHOULDER WITH LIMITED DEBRIDEMENT, REMOVAL OF LOOSE BODY AND RELEASE OF LONG END BICEPS TENDON;  Surgeon: Leanor Kail, MD;  Location: [place];  Service: Orthopedics;  Laterality: Left;  . Colonoscopy  2009?    History reviewed. No pertinent family history.  Social History   Social History  . Marital Status: Married    Spouse Name: N/A  . Number of Children: N/A  . Years of Education: N/A   Occupational History  . Not on file.   Social History Main Topics  . Smoking status: Never Smoker   . Smokeless tobacco: Not on file  . Alcohol Use: Yes     Comment: rare  . Drug Use: Not on file  . Sexual Activity: Yes   Other Topics Concern  . Not on file   Social History Narrative    No Known Allergies   Review of Systems  Constitutional: Negative for fever, chills, weight loss, malaise/fatigue and fatigue.  HENT: Negative for ear discharge, ear pain, hoarse voice and sore throat.   Eyes: Negative for blurred vision.  Respiratory: Negative for cough, sputum production, choking, shortness of breath and wheezing.   Cardiovascular: Negative for chest pain, palpitations, leg swelling and PND.  Gastrointestinal: Negative for heartburn, dysphagia, nausea, abdominal pain, diarrhea, constipation, blood in stool and melena.  Genitourinary: Negative for dysuria,  urgency, frequency and hematuria.  Musculoskeletal: Negative for myalgias, back pain, joint pain, muscle weakness and neck pain.  Skin: Negative for rash.  Neurological: Negative for dizziness, tingling, sensory change, focal weakness and headaches.  Endo/Heme/Allergies: Negative for environmental allergies and polydipsia. Does not bruise/bleed easily.  Psychiatric/Behavioral: Positive for depression. Negative for suicidal ideas and decreased concentration. The patient is not nervous/anxious and does not have insomnia.      Objective  Filed Vitals:   07/21/15 0842  BP: 130/82  Pulse: 60  Height: 6\' 1"  (1.854 m)  Weight: 293 lb (132.904 kg)    Physical Exam  Constitutional: He is oriented to person, place, and time and well-developed, well-nourished, and in no distress.  HENT:  Head: Normocephalic.  Right Ear: External ear normal.  Left Ear: External ear normal.  Nose: Nose normal.  Mouth/Throat: Oropharynx is clear and moist.  Eyes: Conjunctivae and EOM are normal. Pupils are equal, round, and reactive to light. Right eye exhibits no discharge. Left eye exhibits no discharge. No scleral icterus.  Neck: Normal range of motion. Neck supple. No JVD present. No tracheal deviation present. No thyromegaly present.  Cardiovascular: Normal rate, regular rhythm, normal heart sounds and intact distal pulses.  Exam reveals no gallop and no friction rub.   No murmur heard. Pulmonary/Chest:  Breath sounds normal. No respiratory distress. He has no wheezes. He has no rales.  Abdominal: Soft. Bowel sounds are normal. He exhibits no mass. There is no hepatosplenomegaly. There is no tenderness. There is no rebound, no guarding and no CVA tenderness.  Genitourinary: Rectum normal, prostate normal and testes/scrotum normal.  Musculoskeletal: Normal range of motion. He exhibits no edema or tenderness.  Lymphadenopathy:    He has no cervical adenopathy.  Neurological: He is alert and oriented to  person, place, and time. He has normal sensation, normal strength, normal reflexes and intact cranial nerves. No cranial nerve deficit.  Skin: Skin is warm. No rash noted.  Psychiatric: Mood and affect normal.  Nursing note and vitals reviewed.     Assessment & Plan  Problem List Items Addressed This Visit      Cardiovascular and Mediastinum   Essential hypertension   Relevant Medications   amLODipine (NORVASC) 10 MG tablet   hydrochlorothiazide (HYDRODIURIL) 25 MG tablet   losartan (COZAAR) 50 MG tablet   simvastatin (ZOCOR) 40 MG tablet   Other Relevant Orders   Renal Function Panel   Hepatic function panel   PSA     Digestive   GERD (gastroesophageal reflux disease)   Relevant Medications   esomeprazole (NEXIUM) 40 MG capsule     Musculoskeletal and Integument   Arthritis of knee, degenerative   Relevant Medications   traMADol (ULTRAM) 50 MG tablet   meloxicam (MOBIC) 7.5 MG tablet     Other   Depression   Relevant Medications   buPROPion (WELLBUTRIN) 75 MG tablet   escitalopram (LEXAPRO) 20 MG tablet   Hyperlipidemia   Relevant Medications   amLODipine (NORVASC) 10 MG tablet   hydrochlorothiazide (HYDRODIURIL) 25 MG tablet   losartan (COZAAR) 50 MG tablet   simvastatin (ZOCOR) 40 MG tablet   Other Relevant Orders   Lipid Profile    Other Visit Diagnoses    Annual physical exam    -  Primary    Relevant Orders    PSA    Allergic rhinitis due to pollen        Relevant Medications    fluticasone (FLONASE) 50 MCG/ACT nasal spray    Seasonal allergic rhinitis due to pollen        Relevant Medications    fluticasone (FLONASE) 50 MCG/ACT nasal spray    Type 2 diabetes mellitus without complication, unspecified long term insulin use status (HCC)        Relevant Medications    Liraglutide 18 MG/3ML SOPN    losartan (COZAAR) 50 MG tablet    metFORMIN (GLUCOPHAGE) 500 MG tablet    simvastatin (ZOCOR) 40 MG tablet    Other Relevant Orders    Hemoglobin A1c     Hepatic function panel    Microalbumin / creatinine urine ratio    Other inflammatory polyneuropathies (HCC)        Relevant Medications    buPROPion (WELLBUTRIN) 75 MG tablet    escitalopram (LEXAPRO) 20 MG tablet    gabapentin (NEURONTIN) 300 MG capsule      More than 45 minutes was spent with pt   Dr. Deanna [facility]  07/21/2015

## 2015-07-22 LAB — HEPATIC FUNCTION PANEL
ALK PHOS: 60 IU/L (ref 39–117)
ALT: 26 IU/L (ref 0–44)
AST: 20 IU/L (ref 0–40)
BILIRUBIN, DIRECT: 0.2 mg/dL (ref 0.00–0.40)
Bilirubin Total: 0.7 mg/dL (ref 0.0–1.2)
TOTAL PROTEIN: 7.2 g/dL (ref 6.0–8.5)

## 2015-07-22 LAB — RENAL FUNCTION PANEL
ALBUMIN: 4.5 g/dL (ref 3.6–4.8)
BUN/Creatinine Ratio: 18 (ref 10–24)
BUN: 16 mg/dL (ref 8–27)
CHLORIDE: 95 mmol/L — AB (ref 96–106)
CO2: 28 mmol/L (ref 18–29)
Calcium: 9.5 mg/dL (ref 8.6–10.2)
Creatinine, Ser: 0.88 mg/dL (ref 0.76–1.27)
GFR, EST AFRICAN AMERICAN: 108 mL/min/{1.73_m2} (ref >59)
GFR, EST NON AFRICAN AMERICAN: 93 mL/min/{1.73_m2} (ref >59)
GLUCOSE: 103 mg/dL — AB (ref 65–99)
POTASSIUM: 3.5 mmol/L (ref 3.5–5.2)
Phosphorus: 2.9 mg/dL (ref 2.5–4.5)
SODIUM: 142 mmol/L (ref 134–144)

## 2015-07-22 LAB — MICROALBUMIN / CREATININE URINE RATIO
Creatinine, Urine: 132.6 mg/dL
MICROALB/CREAT RATIO: 7.2 mg/g creat (ref 0.0–30.0)
Microalbumin, Urine: 9.5 ug/mL

## 2015-07-22 LAB — LIPID PANEL
CHOL/HDL RATIO: 3.1 ratio (ref 0.0–5.0)
Cholesterol, Total: 150 mg/dL (ref 100–199)
HDL: 49 mg/dL (ref >39)
LDL Calculated: 81 mg/dL (ref 0–99)
Triglycerides: 102 mg/dL (ref 0–149)
VLDL Cholesterol Cal: 20 mg/dL (ref 5–40)

## 2015-07-22 LAB — HEMOGLOBIN A1C
ESTIMATED AVERAGE GLUCOSE: 166 mg/dL
Hgb A1c MFr Bld: 7.4 % — ABNORMAL HIGH (ref 4.8–5.6)

## 2015-07-22 LAB — PSA: Prostate Specific Ag, Serum: 1.3 ng/mL (ref 0.0–4.0)

## 2015-07-22 MED ORDER — DAPAGLIFLOZIN PROPANEDIOL 5 MG PO TABS: 5.0000 mg | ORAL_TABLET | Freq: Every day | ORAL | Status: DC

## 2015-07-22 NOTE — Addendum Note (Signed)
Addended by: Fredderick Severance on: 07/22/2015 03:07 PM   Modules accepted: Orders

## 2015-08-04 ENCOUNTER — Other Ambulatory Visit: Payer: Self-pay | Admitting: Family Medicine

## 2015-08-04 ENCOUNTER — Ambulatory Visit: Payer: Managed Care, Other (non HMO)

## 2015-08-05 ENCOUNTER — Ambulatory Visit (INDEPENDENT_AMBULATORY_CARE_PROVIDER_SITE_OTHER): Payer: Managed Care, Other (non HMO)

## 2015-08-05 DIAGNOSIS — H6123 Impacted cerumen, bilateral: Secondary | ICD-10-CM

## 2015-08-05 NOTE — Progress Notes (Signed)
Pt was not seen- only had ear washing by nurse

## 2015-08-19 ENCOUNTER — Other Ambulatory Visit: Payer: Self-pay

## 2015-08-19 DIAGNOSIS — G6189 Other inflammatory polyneuropathies: Secondary | ICD-10-CM

## 2015-08-19 DIAGNOSIS — I10 Essential (primary) hypertension: Secondary | ICD-10-CM

## 2015-08-19 DIAGNOSIS — E785 Hyperlipidemia, unspecified: Secondary | ICD-10-CM

## 2015-08-19 DIAGNOSIS — K219 Gastro-esophageal reflux disease without esophagitis: Secondary | ICD-10-CM

## 2015-08-19 DIAGNOSIS — J301 Allergic rhinitis due to pollen: Secondary | ICD-10-CM

## 2015-08-19 DIAGNOSIS — E119 Type 2 diabetes mellitus without complications: Secondary | ICD-10-CM

## 2015-08-19 DIAGNOSIS — F32A Depression, unspecified: Secondary | ICD-10-CM

## 2015-08-19 DIAGNOSIS — F329 Major depressive disorder, single episode, unspecified: Secondary | ICD-10-CM

## 2015-08-19 MED ORDER — AMLODIPINE BESYLATE 10 MG PO TABS: ORAL_TABLET | ORAL | Status: DC

## 2015-08-19 MED ORDER — HYDROCHLOROTHIAZIDE 25 MG PO TABS: ORAL_TABLET | ORAL | Status: DC

## 2015-08-19 MED ORDER — ESOMEPRAZOLE MAGNESIUM 40 MG PO CPDR: DELAYED_RELEASE_CAPSULE | ORAL | Status: DC

## 2015-08-19 MED ORDER — LOSARTAN POTASSIUM 50 MG PO TABS: ORAL_TABLET | ORAL | Status: DC

## 2015-08-19 MED ORDER — ESCITALOPRAM OXALATE 20 MG PO TABS: ORAL_TABLET | ORAL | Status: DC

## 2015-08-19 MED ORDER — SIMVASTATIN 40 MG PO TABS: ORAL_TABLET | ORAL | Status: DC

## 2015-08-19 MED ORDER — METFORMIN HCL 500 MG PO TABS: ORAL_TABLET | ORAL | Status: DC

## 2015-08-19 MED ORDER — GABAPENTIN 300 MG PO CAPS: ORAL_CAPSULE | ORAL | Status: DC

## 2015-08-19 MED ORDER — BUPROPION HCL 75 MG PO TABS: ORAL_TABLET | ORAL | Status: DC

## 2015-08-19 MED ORDER — FLUTICASONE PROPIONATE 50 MCG/ACT NA SUSP: 2.0000 | Freq: Every day | NASAL | Status: DC

## 2015-09-13 ENCOUNTER — Other Ambulatory Visit: Payer: Self-pay | Admitting: Family Medicine

## 2015-10-11 ENCOUNTER — Other Ambulatory Visit: Payer: Self-pay | Admitting: Family Medicine

## 2015-12-13 ENCOUNTER — Ambulatory Visit: Payer: Self-pay | Admitting: Physician Assistant

## 2015-12-13 DIAGNOSIS — Z299 Encounter for prophylactic measures, unspecified: Secondary | ICD-10-CM

## 2015-12-13 NOTE — Progress Notes (Signed)
Patient came in to have his influenza shot.

## 2016-01-07 ENCOUNTER — Other Ambulatory Visit: Payer: Self-pay | Admitting: Family Medicine

## 2016-01-07 DIAGNOSIS — F32A Depression, unspecified: Secondary | ICD-10-CM

## 2016-01-07 DIAGNOSIS — F329 Major depressive disorder, single episode, unspecified: Secondary | ICD-10-CM

## 2016-01-07 DIAGNOSIS — K219 Gastro-esophageal reflux disease without esophagitis: Secondary | ICD-10-CM

## 2016-01-07 DIAGNOSIS — G6189 Other inflammatory polyneuropathies: Secondary | ICD-10-CM

## 2016-01-12 ENCOUNTER — Ambulatory Visit
Admission: RE | Admit: 2016-01-12 | Discharge: 2016-01-12 | Disposition: A | Discharge status: Home / Self Care | Payer: Managed Care, Other (non HMO) | Source: Ambulatory Visit | Attending: Surgery | Admitting: Surgery

## 2016-01-12 ENCOUNTER — Encounter
Admission: RE | Admit: 2016-01-12 | Discharge: 2016-01-12 | Disposition: A | Discharge status: Home / Self Care | Payer: Managed Care, Other (non HMO) | Source: Ambulatory Visit | Attending: Surgery | Admitting: Surgery

## 2016-01-12 DIAGNOSIS — Z01818 Encounter for other preprocedural examination: Secondary | ICD-10-CM | POA: Diagnosis present

## 2016-01-12 DIAGNOSIS — Z01810 Encounter for preprocedural cardiovascular examination: Secondary | ICD-10-CM | POA: Insufficient documentation

## 2016-01-12 DIAGNOSIS — M1612 Unilateral primary osteoarthritis, left hip: Secondary | ICD-10-CM | POA: Diagnosis not present

## 2016-01-12 DIAGNOSIS — Z01812 Encounter for preprocedural laboratory examination: Secondary | ICD-10-CM | POA: Diagnosis present

## 2016-01-12 HISTORY — DX: Unspecified osteoarthritis, unspecified site: M19.90

## 2016-01-12 LAB — CBC
HEMATOCRIT: 43.7 % (ref 40.0–52.0)
Hemoglobin: 15.3 g/dL (ref 13.0–18.0)
MCH: 29.2 pg (ref 26.0–34.0)
MCHC: 34.9 g/dL (ref 32.0–36.0)
MCV: 83.6 fL (ref 80.0–100.0)
PLATELETS: 228 10*3/uL (ref 150–440)
RBC: 5.23 MIL/uL (ref 4.40–5.90)
RDW: 13 % (ref 11.5–14.5)
WBC: 8.4 10*3/uL (ref 3.8–10.6)

## 2016-01-12 LAB — BASIC METABOLIC PANEL
ANION GAP: 9 (ref 5–15)
BUN: 17 mg/dL (ref 6–20)
CALCIUM: 9.3 mg/dL (ref 8.9–10.3)
CO2: 28 mmol/L (ref 22–32)
Chloride: 99 mmol/L — ABNORMAL LOW (ref 101–111)
Creatinine, Ser: 0.82 mg/dL (ref 0.61–1.24)
Glucose, Bld: 211 mg/dL — ABNORMAL HIGH (ref 65–99)
POTASSIUM: 3.4 mmol/L — AB (ref 3.5–5.1)
Sodium: 136 mmol/L (ref 135–145)

## 2016-01-12 LAB — PROTIME-INR
INR: 1.01
Prothrombin Time: 13.3 seconds (ref 11.4–15.2)

## 2016-01-12 LAB — TYPE AND SCREEN
ABO/RH(D): AB POS
Antibody Screen: NEGATIVE

## 2016-01-12 LAB — SURGICAL PCR SCREEN
MRSA, PCR: NEGATIVE
Staphylococcus aureus: POSITIVE — AB

## 2016-01-12 LAB — APTT: APTT: 28 s (ref 24–36)

## 2016-01-12 NOTE — Patient Instructions (Signed)
  Your procedure is scheduled on: 01/25/16 Tues Report to Same Day Surgery 2nd floor medical mall To find out your arrival time please call [phone] between 1PM - 3PM on 01/24/16 Mon  Remember: Instructions that are not followed completely may result in serious medical risk, up to and including death, or upon the discretion of your surgeon and anesthesiologist your surgery may need to be rescheduled.    _x___ 1. Do not eat food or drink liquids after midnight. No gum chewing or hard candies.     __x__ 2. No Alcohol for 24 hours before or after surgery.   __x__3. No Smoking for 24 prior to surgery.   ____  4. Bring all medications with you on the day of surgery if instructed.    __x__ 5. Notify your doctor if there is any change in your medical condition     (cold, fever, infections).     Do not wear jewelry, make-up, hairpins, clips or nail polish.  Do not wear lotions, powders, or perfumes. You may wear deodorant.  Do not shave 48 hours prior to surgery. Men may shave face and neck.  Do not bring valuables to the hospital.    [facility] is not responsible for any belongings or valuables.               Contacts, dentures or bridgework may not be worn into surgery.  Leave your suitcase in the car. After surgery it may be brought to your room.  For patients admitted to the hospital, discharge time is determined by your treatment team.   Patients discharged the day of surgery will not be allowed to drive home.  You will need someone to drive you home and stay with you the night of your procedure.    Please read over the following fact sheets that you were given:   [facility] Preparing for Surgery and or MRSA Information   _x___ Take these medicines the morning of surgery with A SIP OF WATER:    1. amLODipine (NORVASC) 10 MG tablet  2.buPROPion (WELLBUTRIN) 75 MG tablet  3.escitalopram (LEXAPRO) 20 MG tablet  4.esomeprazole (NEXIUM) 40 MG capsule  5.gabapentin (NEURONTIN) 300  MG capsule  6.losartan (COZAAR) 50 MG tablet  ____Fleets enema or Magnesium Citrate as directed.   _x___ Use CHG Soap or sage wipes as directed on instruction sheet   ____ Use inhalers on the day of surgery and bring to hospital day of surgery  _x___ Stop metformin 2 days prior to surgery    _x___ Take 1/2 of usual insulin dose the night before surgery and none on the morning of           surgery.   __x__ Stop Aspirin, Coumadin, Pllavix ,Eliquis, Effient, or Pradaxa Stop Aspirin and Mobic 1 week before sugrery  x__ Stop Anti-inflammatories such as Advil, Aleve, Ibuprofen, Motrin, Naproxen,          Naprosyn, Goodies powders or aspirin products. Ok to take Tylenol.   __x__ Stop supplements until after surgery.  Stop fish oil 1 week before surgery.  ____ Bring C-Pap to the hospital.

## 2016-01-25 ENCOUNTER — Inpatient Hospital Stay: Payer: Managed Care, Other (non HMO) | Admitting: Anesthesiology

## 2016-01-25 ENCOUNTER — Encounter
Admission: RE | Disposition: A | Discharge status: Home / Self Care | Payer: Self-pay | Source: Ambulatory Visit | Attending: Surgery

## 2016-01-25 ENCOUNTER — Inpatient Hospital Stay: Payer: Managed Care, Other (non HMO)

## 2016-01-25 ENCOUNTER — Encounter: Payer: Self-pay | Admitting: *Deleted

## 2016-01-25 ENCOUNTER — Inpatient Hospital Stay
Admission: RE | Admit: 2016-01-25 | Discharge: 2016-01-27 | DRG: 470 | Disposition: A | Discharge status: Home / Self Care | Payer: Managed Care, Other (non HMO) | Source: Ambulatory Visit | Attending: Surgery | Admitting: Surgery

## 2016-01-25 DIAGNOSIS — M25552 Pain in left hip: Secondary | ICD-10-CM | POA: Diagnosis present

## 2016-01-25 DIAGNOSIS — Z7982 Long term (current) use of aspirin: Secondary | ICD-10-CM | POA: Diagnosis not present

## 2016-01-25 DIAGNOSIS — Z6836 Body mass index (BMI) 36.0-36.9, adult: Secondary | ICD-10-CM | POA: Diagnosis not present

## 2016-01-25 DIAGNOSIS — Z79899 Other long term (current) drug therapy: Secondary | ICD-10-CM | POA: Diagnosis not present

## 2016-01-25 DIAGNOSIS — Z8249 Family history of ischemic heart disease and other diseases of the circulatory system: Secondary | ICD-10-CM

## 2016-01-25 DIAGNOSIS — E876 Hypokalemia: Secondary | ICD-10-CM | POA: Diagnosis not present

## 2016-01-25 DIAGNOSIS — Z7984 Long term (current) use of oral hypoglycemic drugs: Secondary | ICD-10-CM | POA: Diagnosis not present

## 2016-01-25 DIAGNOSIS — Z96642 Presence of left artificial hip joint: Secondary | ICD-10-CM

## 2016-01-25 DIAGNOSIS — E785 Hyperlipidemia, unspecified: Secondary | ICD-10-CM | POA: Diagnosis present

## 2016-01-25 DIAGNOSIS — K219 Gastro-esophageal reflux disease without esophagitis: Secondary | ICD-10-CM | POA: Diagnosis present

## 2016-01-25 DIAGNOSIS — I10 Essential (primary) hypertension: Secondary | ICD-10-CM | POA: Diagnosis present

## 2016-01-25 DIAGNOSIS — E119 Type 2 diabetes mellitus without complications: Secondary | ICD-10-CM | POA: Diagnosis present

## 2016-01-25 DIAGNOSIS — J4520 Mild intermittent asthma, uncomplicated: Secondary | ICD-10-CM | POA: Diagnosis present

## 2016-01-25 DIAGNOSIS — G4730 Sleep apnea, unspecified: Secondary | ICD-10-CM | POA: Diagnosis present

## 2016-01-25 DIAGNOSIS — M1612 Unilateral primary osteoarthritis, left hip: Secondary | ICD-10-CM | POA: Diagnosis present

## 2016-01-25 DIAGNOSIS — M6281 Muscle weakness (generalized): Secondary | ICD-10-CM

## 2016-01-25 DIAGNOSIS — R262 Difficulty in walking, not elsewhere classified: Secondary | ICD-10-CM

## 2016-01-25 HISTORY — PX: TOTAL HIP ARTHROPLASTY: SHX124

## 2016-01-25 LAB — ABO/RH: ABO/RH(D): AB POS

## 2016-01-25 LAB — GLUCOSE, CAPILLARY
Glucose-Capillary: 226 mg/dL — ABNORMAL HIGH (ref 65–99)
Glucose-Capillary: 197 mg/dL — ABNORMAL HIGH (ref 65–99)

## 2016-01-25 SURGERY — ARTHROPLASTY, HIP, TOTAL,POSTERIOR APPROACH
Anesthesia: Choice | Site: Hip | Laterality: Left | Wound class: Clean

## 2016-01-25 MED ORDER — FLUTICASONE PROPIONATE 50 MCG/ACT NA SUSP
2.0000 | Freq: Every day | NASAL | Status: DC
  Administered 2016-01-25 – 2016-01-27 (×3): 2 via NASAL
  Filled 2016-01-25: qty 16

## 2016-01-25 MED ORDER — EPHEDRINE SULFATE 50 MG/ML IJ SOLN
INTRAMUSCULAR | Status: DC | PRN
  Administered 2016-01-25 (×3): 10 mg via INTRAVENOUS

## 2016-01-25 MED ORDER — ESCITALOPRAM OXALATE 10 MG PO TABS
20.0000 mg | ORAL_TABLET | Freq: Every day | ORAL | Status: DC
  Administered 2016-01-25 – 2016-01-27 (×3): 20 mg via ORAL
  Filled 2016-01-25 (×4): qty 2

## 2016-01-25 MED ORDER — ACETAMINOPHEN 10 MG/ML IV SOLN
INTRAVENOUS | Status: DC | PRN
  Administered 2016-01-25: 1000 mg via INTRAVENOUS

## 2016-01-25 MED ORDER — BISACODYL 10 MG RE SUPP: 10.0000 mg | Freq: Every day | RECTAL | Status: DC | PRN

## 2016-01-25 MED ORDER — LOSARTAN POTASSIUM 50 MG PO TABS
50.0000 mg | ORAL_TABLET | Freq: Every day | ORAL | Status: DC
  Administered 2016-01-25 – 2016-01-27 (×3): 50 mg via ORAL
  Filled 2016-01-25 (×3): qty 1

## 2016-01-25 MED ORDER — SODIUM CHLORIDE 0.9 % IV SOLN
INTRAVENOUS | Status: DC
  Administered 2016-01-25: 07:00:00 via INTRAVENOUS
  Administered 2016-01-25: 1000 mL via INTRAVENOUS
  Administered 2016-01-25: 09:00:00 via INTRAVENOUS

## 2016-01-25 MED ORDER — LIDOCAINE HCL (CARDIAC) 20 MG/ML IV SOLN
INTRAVENOUS | Status: DC | PRN
  Administered 2016-01-25: 40 mg via INTRAVENOUS

## 2016-01-25 MED ORDER — GLUCOSAMINE-CHONDROITIN 500-400 MG PO TABS: 1.0000 | ORAL_TABLET | Freq: Every day | ORAL | Status: DC

## 2016-01-25 MED ORDER — FENTANYL CITRATE (PF) 100 MCG/2ML IJ SOLN
25.0000 ug | INTRAMUSCULAR | Status: DC | PRN
  Administered 2016-01-25: 25 ug via INTRAVENOUS

## 2016-01-25 MED ORDER — MAGNESIUM HYDROXIDE 400 MG/5ML PO SUSP
30.0000 mL | Freq: Every day | ORAL | Status: DC | PRN
  Administered 2016-01-27: 30 mL via ORAL
  Filled 2016-01-25: qty 30

## 2016-01-25 MED ORDER — VITAMIN D 1000 UNITS PO TABS
1000.0000 [IU] | ORAL_TABLET | Freq: Every day | ORAL | Status: DC
  Administered 2016-01-25 – 2016-01-27 (×3): 1000 [IU] via ORAL
  Filled 2016-01-25 (×3): qty 1

## 2016-01-25 MED ORDER — SIMVASTATIN 20 MG PO TABS
40.0000 mg | ORAL_TABLET | Freq: Every day | ORAL | Status: DC
  Administered 2016-01-25 – 2016-01-26 (×2): 40 mg via ORAL
  Filled 2016-01-25 (×2): qty 2

## 2016-01-25 MED ORDER — ONDANSETRON HCL 4 MG/2ML IJ SOLN: 4.0000 mg | Freq: Four times a day (QID) | INTRAMUSCULAR | Status: DC | PRN

## 2016-01-25 MED ORDER — NEOMYCIN-POLYMYXIN B GU 40-200000 IR SOLN
Status: AC
  Filled 2016-01-25: qty 20

## 2016-01-25 MED ORDER — ACETAMINOPHEN 650 MG RE SUPP: 650.0000 mg | Freq: Four times a day (QID) | RECTAL | Status: DC | PRN

## 2016-01-25 MED ORDER — SODIUM CHLORIDE 0.9 % IV SOLN
INTRAVENOUS | Status: DC | PRN
  Administered 2016-01-25: 10:00:00 via INTRAVENOUS

## 2016-01-25 MED ORDER — VITAMIN B-12 1000 MCG PO TABS
1000.0000 ug | ORAL_TABLET | Freq: Every day | ORAL | Status: DC
  Administered 2016-01-25 – 2016-01-27 (×3): 1000 ug via ORAL
  Filled 2016-01-25 (×3): qty 1

## 2016-01-25 MED ORDER — METOCLOPRAMIDE HCL 10 MG PO TABS: 5.0000 mg | ORAL_TABLET | Freq: Three times a day (TID) | ORAL | Status: DC | PRN

## 2016-01-25 MED ORDER — LIRAGLUTIDE 18 MG/3ML ~~LOC~~ SOPN
1.8000 mg | PEN_INJECTOR | Freq: Every day | SUBCUTANEOUS | Status: DC
  Administered 2016-01-25: 1.8 mg via SUBCUTANEOUS

## 2016-01-25 MED ORDER — BUPIVACAINE-EPINEPHRINE (PF) 0.25% -1:200000 IJ SOLN
INTRAMUSCULAR | Status: DC | PRN
  Administered 2016-01-25: 30 mL

## 2016-01-25 MED ORDER — SUCCINYLCHOLINE CHLORIDE 20 MG/ML IJ SOLN
INTRAMUSCULAR | Status: DC | PRN
  Administered 2016-01-25: 120 mg via INTRAVENOUS

## 2016-01-25 MED ORDER — AMLODIPINE BESYLATE 10 MG PO TABS
10.0000 mg | ORAL_TABLET | Freq: Every day | ORAL | Status: DC
  Administered 2016-01-25 – 2016-01-27 (×3): 10 mg via ORAL
  Filled 2016-01-25 (×3): qty 1

## 2016-01-25 MED ORDER — ACETAMINOPHEN 500 MG PO TABS
1000.0000 mg | ORAL_TABLET | Freq: Four times a day (QID) | ORAL | Status: AC
  Administered 2016-01-25 – 2016-01-26 (×4): 1000 mg via ORAL
  Filled 2016-01-25 (×4): qty 2

## 2016-01-25 MED ORDER — OXYCODONE HCL 5 MG PO TABS
5.0000 mg | ORAL_TABLET | ORAL | Status: DC | PRN
  Administered 2016-01-25: 5 mg via ORAL
  Administered 2016-01-26 (×2): 10 mg via ORAL
  Administered 2016-01-26: 5 mg via ORAL
  Administered 2016-01-27: 10 mg via ORAL
  Filled 2016-01-25 (×2): qty 1
  Filled 2016-01-25 (×3): qty 2

## 2016-01-25 MED ORDER — FENTANYL CITRATE (PF) 100 MCG/2ML IJ SOLN
INTRAMUSCULAR | Status: DC | PRN
  Administered 2016-01-25 (×2): 50 ug via INTRAVENOUS

## 2016-01-25 MED ORDER — FENTANYL CITRATE (PF) 100 MCG/2ML IJ SOLN
INTRAMUSCULAR | Status: AC
  Administered 2016-01-25: 25 ug via INTRAVENOUS
  Filled 2016-01-25: qty 2

## 2016-01-25 MED ORDER — TRANEXAMIC ACID 1000 MG/10ML IV SOLN
INTRAVENOUS | Status: AC
  Filled 2016-01-25: qty 10

## 2016-01-25 MED ORDER — SODIUM CHLORIDE 0.9 % IJ SOLN
INTRAMUSCULAR | Status: AC
  Filled 2016-01-25: qty 50

## 2016-01-25 MED ORDER — FLEET ENEMA 7-19 GM/118ML RE ENEM: 1.0000 | ENEMA | Freq: Once | RECTAL | Status: DC | PRN

## 2016-01-25 MED ORDER — DIPHENHYDRAMINE HCL 12.5 MG/5ML PO ELIX: 12.5000 mg | ORAL_SOLUTION | ORAL | Status: DC | PRN

## 2016-01-25 MED ORDER — ASPIRIN 81 MG PO CHEW
81.0000 mg | CHEWABLE_TABLET | Freq: Every day | ORAL | Status: DC
  Administered 2016-01-25 – 2016-01-27 (×3): 81 mg via ORAL
  Filled 2016-01-25 (×3): qty 1

## 2016-01-25 MED ORDER — OMEGA-3-ACID ETHYL ESTERS 1 G PO CAPS
1.0000 | ORAL_CAPSULE | Freq: Every day | ORAL | Status: DC
  Administered 2016-01-25 – 2016-01-27 (×3): 1 g via ORAL
  Filled 2016-01-25 (×3): qty 1

## 2016-01-25 MED ORDER — PANTOPRAZOLE SODIUM 40 MG PO TBEC
40.0000 mg | DELAYED_RELEASE_TABLET | Freq: Two times a day (BID) | ORAL | Status: DC
  Administered 2016-01-25 – 2016-01-27 (×5): 40 mg via ORAL
  Filled 2016-01-25 (×5): qty 1

## 2016-01-25 MED ORDER — FAMOTIDINE 20 MG PO TABS
20.0000 mg | ORAL_TABLET | Freq: Once | ORAL | Status: AC
  Administered 2016-01-25: 20 mg via ORAL

## 2016-01-25 MED ORDER — VANCOMYCIN HCL 10 G IV SOLR
1500.0000 mg | Freq: Once | INTRAVENOUS | Status: AC
  Administered 2016-01-25: 1500 mg via INTRAVENOUS
  Filled 2016-01-25: qty 1500

## 2016-01-25 MED ORDER — DEXTROSE 5 % IV SOLN
3.0000 g | Freq: Four times a day (QID) | INTRAVENOUS | Status: AC
  Administered 2016-01-25 (×3): 3 g via INTRAVENOUS
  Filled 2016-01-25 (×3): qty 3000

## 2016-01-25 MED ORDER — MIDAZOLAM HCL 5 MG/5ML IJ SOLN
INTRAMUSCULAR | Status: DC | PRN
  Administered 2016-01-25: 2 mg via INTRAVENOUS

## 2016-01-25 MED ORDER — ONDANSETRON HCL 4 MG PO TABS: 4.0000 mg | ORAL_TABLET | Freq: Four times a day (QID) | ORAL | Status: DC | PRN

## 2016-01-25 MED ORDER — METOCLOPRAMIDE HCL 5 MG/ML IJ SOLN: 5.0000 mg | Freq: Three times a day (TID) | INTRAMUSCULAR | Status: DC | PRN

## 2016-01-25 MED ORDER — FAMOTIDINE 20 MG PO TABS
ORAL_TABLET | ORAL | Status: AC
  Administered 2016-01-25: 20 mg via ORAL
  Filled 2016-01-25: qty 1

## 2016-01-25 MED ORDER — KETOROLAC TROMETHAMINE 30 MG/ML IJ SOLN
15.0000 mg | Freq: Four times a day (QID) | INTRAMUSCULAR | Status: AC
  Administered 2016-01-25 – 2016-01-26 (×4): 15 mg via INTRAVENOUS
  Filled 2016-01-25 (×5): qty 1

## 2016-01-25 MED ORDER — POTASSIUM CHLORIDE IN NACL 20-0.9 MEQ/L-% IV SOLN
INTRAVENOUS | Status: DC
  Administered 2016-01-25: 14:00:00 via INTRAVENOUS
  Filled 2016-01-25 (×7): qty 1000

## 2016-01-25 MED ORDER — ONDANSETRON HCL 4 MG/2ML IJ SOLN: 4.0000 mg | Freq: Once | INTRAMUSCULAR | Status: DC | PRN

## 2016-01-25 MED ORDER — KETOROLAC TROMETHAMINE 30 MG/ML IJ SOLN
30.0000 mg | Freq: Once | INTRAMUSCULAR | Status: AC
  Administered 2016-01-25: 30 mg via INTRAVENOUS

## 2016-01-25 MED ORDER — KETAMINE HCL 10 MG/ML IJ SOLN
INTRAMUSCULAR | Status: DC | PRN
  Administered 2016-01-25: 30 mg via INTRAVENOUS

## 2016-01-25 MED ORDER — SUGAMMADEX SODIUM 200 MG/2ML IV SOLN
INTRAVENOUS | Status: DC | PRN
  Administered 2016-01-25: 245 mg via INTRAVENOUS

## 2016-01-25 MED ORDER — GABAPENTIN 300 MG PO CAPS
300.0000 mg | ORAL_CAPSULE | Freq: Three times a day (TID) | ORAL | Status: DC
  Administered 2016-01-25 – 2016-01-27 (×6): 300 mg via ORAL
  Filled 2016-01-25 (×7): qty 1

## 2016-01-25 MED ORDER — ONDANSETRON HCL 4 MG/2ML IJ SOLN
INTRAMUSCULAR | Status: DC | PRN
  Administered 2016-01-25: 4 mg via INTRAVENOUS

## 2016-01-25 MED ORDER — CYCLOBENZAPRINE HCL 10 MG PO TABS
10.0000 mg | ORAL_TABLET | Freq: Two times a day (BID) | ORAL | Status: DC | PRN
  Administered 2016-01-26: 10 mg via ORAL
  Filled 2016-01-25: qty 1

## 2016-01-25 MED ORDER — BUPIVACAINE LIPOSOME 1.3 % IJ SUSP
INTRAMUSCULAR | Status: AC
  Filled 2016-01-25: qty 20

## 2016-01-25 MED ORDER — ACETAMINOPHEN 325 MG PO TABS: 650.0000 mg | ORAL_TABLET | Freq: Four times a day (QID) | ORAL | Status: DC | PRN

## 2016-01-25 MED ORDER — PROPOFOL 10 MG/ML IV BOLUS
INTRAVENOUS | Status: DC | PRN
  Administered 2016-01-25: 200 mg via INTRAVENOUS

## 2016-01-25 MED ORDER — KETOROLAC TROMETHAMINE 30 MG/ML IJ SOLN
INTRAMUSCULAR | Status: AC
  Administered 2016-01-25: 30 mg via INTRAVENOUS
  Filled 2016-01-25: qty 1

## 2016-01-25 MED ORDER — LACTATED RINGERS IV SOLN: INTRAVENOUS | Status: DC | PRN

## 2016-01-25 MED ORDER — BUPROPION HCL 75 MG PO TABS
75.0000 mg | ORAL_TABLET | Freq: Every day | ORAL | Status: DC
  Administered 2016-01-25 – 2016-01-27 (×3): 75 mg via ORAL
  Filled 2016-01-25 (×3): qty 1

## 2016-01-25 MED ORDER — BUPIVACAINE-EPINEPHRINE (PF) 0.25% -1:200000 IJ SOLN
INTRAMUSCULAR | Status: AC
  Filled 2016-01-25: qty 30

## 2016-01-25 MED ORDER — PHENYLEPHRINE HCL 10 MG/ML IJ SOLN
INTRAMUSCULAR | Status: DC | PRN
  Administered 2016-01-25 (×4): 100 ug via INTRAVENOUS

## 2016-01-25 MED ORDER — FERROUS SULFATE 325 (65 FE) MG PO TABS
325.0000 mg | ORAL_TABLET | Freq: Three times a day (TID) | ORAL | Status: DC
  Administered 2016-01-25 – 2016-01-27 (×6): 325 mg via ORAL
  Filled 2016-01-25 (×7): qty 1

## 2016-01-25 MED ORDER — ENOXAPARIN SODIUM 40 MG/0.4ML ~~LOC~~ SOLN
40.0000 mg | SUBCUTANEOUS | Status: DC
  Administered 2016-01-26 – 2016-01-27 (×2): 40 mg via SUBCUTANEOUS
  Filled 2016-01-25 (×2): qty 0.4

## 2016-01-25 MED ORDER — NEOMYCIN-POLYMYXIN B GU 40-200000 IR SOLN
Status: DC | PRN
  Administered 2016-01-25: 16 mL

## 2016-01-25 MED ORDER — HYDROMORPHONE HCL 1 MG/ML IJ SOLN: 1.0000 mg | INTRAMUSCULAR | Status: DC | PRN

## 2016-01-25 MED ORDER — ACETAMINOPHEN 10 MG/ML IV SOLN
INTRAVENOUS | Status: AC
  Filled 2016-01-25: qty 100

## 2016-01-25 MED ORDER — HYDROCHLOROTHIAZIDE 25 MG PO TABS
25.0000 mg | ORAL_TABLET | Freq: Every day | ORAL | Status: DC
  Administered 2016-01-25 – 2016-01-27 (×3): 25 mg via ORAL
  Filled 2016-01-25 (×4): qty 1

## 2016-01-25 MED ORDER — TRANEXAMIC ACID 1000 MG/10ML IV SOLN
INTRAVENOUS | Status: DC | PRN
  Administered 2016-01-25: 1000 mg via TOPICAL

## 2016-01-25 MED ORDER — DOCUSATE SODIUM 100 MG PO CAPS
100.0000 mg | ORAL_CAPSULE | Freq: Two times a day (BID) | ORAL | Status: DC
  Administered 2016-01-25 – 2016-01-27 (×5): 100 mg via ORAL
  Filled 2016-01-25 (×6): qty 1

## 2016-01-25 MED ORDER — SODIUM CHLORIDE 0.9 % IV SOLN
INTRAVENOUS | Status: DC | PRN
  Administered 2016-01-25: 60 mL

## 2016-01-25 MED ORDER — ROCURONIUM BROMIDE 100 MG/10ML IV SOLN
INTRAVENOUS | Status: DC | PRN
  Administered 2016-01-25: 10 mg via INTRAVENOUS
  Administered 2016-01-25: 5 mg via INTRAVENOUS
  Administered 2016-01-25: 20 mg via INTRAVENOUS

## 2016-01-25 MED ORDER — PNEUMOCOCCAL VAC POLYVALENT 25 MCG/0.5ML IJ INJ: 0.5000 mL | INJECTION | INTRAMUSCULAR | Status: DC

## 2016-01-25 MED ORDER — METFORMIN HCL 500 MG PO TABS
500.0000 mg | ORAL_TABLET | Freq: Two times a day (BID) | ORAL | Status: DC
  Administered 2016-01-25 – 2016-01-27 (×4): 500 mg via ORAL
  Filled 2016-01-25 (×4): qty 1

## 2016-01-25 SURGICAL SUPPLY — 67 items
4.8 SMOOTH KWIRE ×3 IMPLANT
BAG DECANTER FOR FLEXI CONT (MISCELLANEOUS) IMPLANT
BIT DRILL 12.7X2STRG SHNK (BIT) ×1 IMPLANT
BIT DRILL 2.0 (BIT) ×2
BIT DRL 12.7X2STRG SHNK (BIT) ×1
BLADE SAGITTAL WIDE XTHICK NO (BLADE) ×3 IMPLANT
BLADE SURG SZ20 CARB STEEL (BLADE) ×3 IMPLANT
BNDG COHESIVE 6X5 TAN STRL LF (GAUZE/BANDAGES/DRESSINGS) ×3 IMPLANT
CANISTER SUCT 1200ML W/VALVE (MISCELLANEOUS) ×3 IMPLANT
CANISTER SUCT 3000ML (MISCELLANEOUS) ×6 IMPLANT
CAPT HIP TOTAL 3 ×3 IMPLANT
CATH TRAY METER 16FR LF (MISCELLANEOUS) ×3 IMPLANT
CHLORAPREP W/TINT 26ML (MISCELLANEOUS) ×6 IMPLANT
COVER MAYO STAND STRL (DRAPES) ×3 IMPLANT
DRAPE IMP U-DRAPE 54X76 (DRAPES) ×6 IMPLANT
DRAPE INCISE IOBAN 66X60 STRL (DRAPES) ×3 IMPLANT
DRAPE SHEET LG 3/4 BI-LAMINATE (DRAPES) ×3 IMPLANT
DRAPE SURG 17X11 SM STRL (DRAPES) ×6 IMPLANT
DRAPE TABLE BACK 80X90 (DRAPES) ×3 IMPLANT
DRSG OPSITE POSTOP 4X10 (GAUZE/BANDAGES/DRESSINGS) ×3 IMPLANT
DRSG OPSITE POSTOP 4X12 (GAUZE/BANDAGES/DRESSINGS) ×3 IMPLANT
DRSG OPSITE POSTOP 4X14 (GAUZE/BANDAGES/DRESSINGS) ×3 IMPLANT
ELECT BLADE 6.5 EXT (BLADE) ×3 IMPLANT
ELECT CAUTERY BLADE 6.4 (BLADE) ×3 IMPLANT
GAUZE PACK 2X3YD (MISCELLANEOUS) ×3 IMPLANT
GLOVE BIO SURGEON STRL SZ7.5 (GLOVE) ×6 IMPLANT
GLOVE BIO SURGEON STRL SZ8 (GLOVE) ×6 IMPLANT
GLOVE BIOGEL PI IND STRL 8 (GLOVE) ×1 IMPLANT
GLOVE BIOGEL PI INDICATOR 8 (GLOVE) ×2
GLOVE INDICATOR 8.0 STRL GRN (GLOVE) ×3 IMPLANT
GOWN STRL REUS W/ TWL LRG LVL3 (GOWN DISPOSABLE) ×2 IMPLANT
GOWN STRL REUS W/ TWL XL LVL3 (GOWN DISPOSABLE) ×1 IMPLANT
GOWN STRL REUS W/TWL LRG LVL3 (GOWN DISPOSABLE) ×4
GOWN STRL REUS W/TWL XL LVL3 (GOWN DISPOSABLE) ×2
HANDPIECE INTERPULSE COAX TIP (DISPOSABLE) ×2
HOOD PEEL AWAY FLYTE STAYCOOL (MISCELLANEOUS) ×9 IMPLANT
IV NS 100ML SINGLE PACK (IV SOLUTION) IMPLANT
KIT RM TURNOVER STRD PROC AR (KITS) ×3 IMPLANT
NDL SAFETY 18GX1.5 (NEEDLE) ×3 IMPLANT
NEEDLE FILTER BLUNT 18X 1/2SAF (NEEDLE) ×2
NEEDLE FILTER BLUNT 18X1 1/2 (NEEDLE) ×1 IMPLANT
NEEDLE SPNL 20GX3.5 QUINCKE YW (NEEDLE) ×3 IMPLANT
NEEDLE SPNL 22GX3.5 QUINCKE BK (NEEDLE) ×3 IMPLANT
NS IRRIG 1000ML POUR BTL (IV SOLUTION) ×3 IMPLANT
PACK HIP PROSTHESIS (MISCELLANEOUS) ×3 IMPLANT
PILLOW ABDUC SM (MISCELLANEOUS) ×3 IMPLANT
PRESSURIZER CEMENT PROX FEM SM (MISCELLANEOUS) IMPLANT
SET HNDPC FAN SPRY TIP SCT (DISPOSABLE) ×1 IMPLANT
SOL .9 NS 3000ML IRR  AL (IV SOLUTION) ×2
SOL .9 NS 3000ML IRR UROMATIC (IV SOLUTION) ×1 IMPLANT
SPONGE LAP 18X18 5 PK (GAUZE/BANDAGES/DRESSINGS) ×3 IMPLANT
SPONGE XRAY 4X4 16PLY STRL (MISCELLANEOUS) ×3 IMPLANT
STAPLER SKIN PROX 35W (STAPLE) ×3 IMPLANT
SUT ETHIBOND #5 BRAIDED 30INL (SUTURE) ×6 IMPLANT
SUT ETHIBOND CT1 BRD #0 30IN (SUTURE) ×6 IMPLANT
SUT ETHIBOND CT1 BRD 2-0 30IN (SUTURE) ×6 IMPLANT
SUT VIC AB 1 CT1 36 (SUTURE) ×6 IMPLANT
SUT VIC AB 1 CTX 27 (SUTURE) ×3 IMPLANT
SUT VIC AB 2-0 CT1 (SUTURE) ×6 IMPLANT
SUT VIC AB 2-0 CT1 27 (SUTURE) ×8
SUT VIC AB 2-0 CT1 TAPERPNT 27 (SUTURE) ×4 IMPLANT
SYR 20CC LL (SYRINGE) ×3 IMPLANT
SYR 30ML LL (SYRINGE) ×3 IMPLANT
SYR TB 1ML 27GX1/2 LL (SYRINGE) ×3 IMPLANT
SYRINGE 10CC LL (SYRINGE) ×3 IMPLANT
TAPE TRANSPORE STRL 2 31045 (GAUZE/BANDAGES/DRESSINGS) ×3 IMPLANT
TIP COAXIAL FEMORAL CANAL (MISCELLANEOUS) ×3 IMPLANT

## 2016-01-25 NOTE — Care Management (Signed)
Met with patient and his wife at bedside. Patient has walker. No other DME needs. Started [facility] ( [phone]) authorization. Reference # for PT request is [id number]. Patient chooses [facility]. RNCM will fax [facility] order and clinical when the become available.

## 2016-01-25 NOTE — H&P (Signed)
Paper H&P to be scanned into permanent record. H&P reviewed. No changes. 

## 2016-01-25 NOTE — Progress Notes (Signed)
Inpatient Diabetes Program Recommendations  AACE/ADA: New Consensus Statement on Inpatient Glycemic Control (2015)  Target Ranges:  Prepandial:   less than 140 mg/dL      Peak postprandial:   less than 180 mg/dL (1-2 hours)      Critically ill patients:  140 - 180 mg/dL   Results for Raymond [name], Raymond [name] (MRN [id number]) as of 01/25/2016 12:19  Ref. Range 01/25/2016 06:17 01/25/2016 10:39  Glucose-Capillary Latest Ref Range: 65 - 99 mg/dL 226 (H) 197 (H)    Admit with: R Total Hip  History: DM2  Home DM Meds: Metformin 500 mg BID       Victoza 1.8 mg QPM       Farxiga 5 mg daily (patient not currently taking [place])  Current Insulin Orders: Metformin 500 mg BID      Victoza 1.8 QPM      MD- Please also consider starting Novolog Sensitive Correction Scale/ SSI (0-9 units) TID AC + HS      --Will follow patient during hospitalization--  Wyn Quaker RN, MSN, CDE Diabetes Coordinator Inpatient Glycemic Control Team Team Pager: [phone] (8a-5p)

## 2016-01-25 NOTE — NC FL2 (Signed)
[place] LEVEL OF CARE SCREENING TOOL     IDENTIFICATION  Patient Name: Raymond [name] Birthdate: [birth year] Sex: male Admission Date (Current Location): 01/25/2016  [place] and [place] Number:  Engineering geologist and Address:  [facility], [address], [place], [place] [address]      Provider Number: [id number]  Attending Physician Name and Address:  Corky Mull, MD  Relative Name and Phone Number:       Current Level of Care: Hospital Recommended Level of Care: [place] Prior Approval Number:    Date Approved/Denied:   PASRR Number:  ([id number])  Discharge Plan: SNF    Current Diagnoses: Patient Active Problem List   Diagnosis Date Noted  . Status post total hip replacement, left 01/25/2016  . Essential hypertension 07/21/2015  . Depression 07/21/2015  . Hyperlipidemia 07/21/2015  . GERD (gastroesophageal reflux disease) 12/04/2014  . Biceps tendinitis 07/21/2014  . Impingement syndrome of shoulder 07/21/2014  . Arthritis of knee, degenerative 08/14/2013  . Neuritis or radiculitis due to rupture of lumbar intervertebral disc 08/14/2013    Orientation RESPIRATION BLADDER Height & Weight     Self, Time, Situation, Place  Normal Continent Weight: 270 lb (122.5 kg) Height:  6' (182.9 cm)  BEHAVIORAL SYMPTOMS/MOOD NEUROLOGICAL BOWEL NUTRITION STATUS   (none)  (none) Continent Diet (Diet: Carb Modified. )  AMBULATORY STATUS COMMUNICATION OF NEEDS Skin   Extensive Assist Verbally Surgical wounds (Incision: Left Hip. )                       Personal Care Assistance Level of Assistance  Bathing, Feeding, Dressing Bathing Assistance: Limited assistance Feeding assistance: Independent Dressing Assistance: Limited assistance     Functional Limitations Info  Sight, Hearing, Speech Sight Info: Adequate Hearing Info: Adequate Speech Info: Adequate    SPECIAL CARE FACTORS FREQUENCY  PT (By  licensed PT), OT (By licensed OT)     PT Frequency:  (5) OT Frequency:  (5)            Contractures      Additional Factors Info  Code Status, Allergies Code Status Info:  (Full Code. ) Allergies Info:  (No Known Allergies. )           Current Medications (01/25/2016):  This is the current hospital active medication list Current Facility-Administered Medications  Medication Dose Route Frequency Provider Last Rate Last Dose  . 0.9 % NaCl with KCl 20 mEq/ L  infusion   Intravenous Continuous Corky Mull, MD 100 mL/hr at 01/25/16 1333    . acetaminophen (TYLENOL) tablet 650 mg  650 mg Oral Q6H PRN Corky Mull, MD       Or  . acetaminophen (TYLENOL) suppository 650 mg  650 mg Rectal Q6H PRN Corky Mull, MD      . acetaminophen (TYLENOL) tablet 1,000 mg  1,000 mg Oral Q6H Corky Mull, MD   1,000 mg at 01/25/16 1338  . amLODipine (NORVASC) tablet 10 mg  10 mg Oral Daily Corky Mull, MD   10 mg at 01/25/16 1400  . aspirin chewable tablet 81 mg  81 mg Oral Daily Corky Mull, MD   81 mg at 01/25/16 1358  . bisacodyl (DULCOLAX) suppository 10 mg  10 mg Rectal Daily PRN Corky Mull, MD      . buPROPion [facility]) tablet 75 mg  75 mg Oral Q0600 Corky Mull, MD      .  ceFAZolin (ANCEF) 3 g in dextrose 5 % 50 mL IVPB  3 g Intravenous Q6H Corky Mull, MD   3 g at 01/25/16 1402  . cholecalciferol (VITAMIN D) tablet 1,000 Units  1,000 Units Oral Daily Corky Mull, MD   1,000 Units at 01/25/16 1400  . cyclobenzaprine (FLEXERIL) tablet 10 mg  10 mg Oral BID PRN Corky Mull, MD      . diphenhydrAMINE (BENADRYL) 12.5 MG/5ML elixir 12.5-25 mg  12.5-25 mg Oral Q4H PRN Corky Mull, MD      . docusate sodium (COLACE) capsule 100 mg  100 mg Oral BID Corky Mull, MD   100 mg at 01/25/16 1359  . [START ON 01/26/2016] enoxaparin (LOVENOX) injection 40 mg  40 mg Subcutaneous Q24H Corky Mull, MD      . escitalopram (LEXAPRO) tablet 20 mg  20 mg Oral Daily Corky Mull, MD   20 mg at 01/25/16  1400  . ferrous sulfate tablet 325 mg  325 mg Oral TID PC Corky Mull, MD   325 mg at 01/25/16 1400  . fluticasone (FLONASE) 50 MCG/ACT nasal spray 2 spray  2 spray Each Nare Daily Corky Mull, MD      . gabapentin (NEURONTIN) capsule 300 mg  300 mg Oral TID Corky Mull, MD   300 mg at 01/25/16 1401  . hydrochlorothiazide (HYDRODIURIL) tablet 25 mg  25 mg Oral Daily Corky Mull, MD   25 mg at 01/25/16 1358  . HYDROmorphone (DILAUDID) injection 1-2 mg  1-2 mg Intravenous Q2H PRN Corky Mull, MD      . ketorolac (TORADOL) 30 MG/ML injection 15 mg  15 mg Intravenous Q6H Corky Mull, MD   15 mg at 01/25/16 1336  . liraglutide SOPN 1.8 mg  1.8 mg Subcutaneous QPC supper Corky Mull, MD      . losartan (COZAAR) tablet 50 mg  50 mg Oral Daily Corky Mull, MD   50 mg at 01/25/16 1359  . magnesium hydroxide (MILK OF MAGNESIA) suspension 30 mL  30 mL Oral Daily PRN Corky Mull, MD      . metFORMIN (GLUCOPHAGE) tablet 500 mg  500 mg Oral BID WC Corky Mull, MD      . metoCLOPramide (REGLAN) tablet 5-10 mg  5-10 mg Oral Q8H PRN Corky Mull, MD       Or  . metoCLOPramide (REGLAN) injection 5-10 mg  5-10 mg Intravenous Q8H PRN Corky Mull, MD      . omega-3 acid ethyl esters (LOVAZA) capsule 1 g  1 capsule Oral Daily Corky Mull, MD   1 g at 01/25/16 1358  . ondansetron (ZOFRAN) tablet 4 mg  4 mg Oral Q6H PRN Corky Mull, MD       Or  . ondansetron [facility]) injection 4 mg  4 mg Intravenous Q6H PRN Corky Mull, MD      . oxyCODONE (Oxy IR/ROXICODONE) immediate release tablet 5-10 mg  5-10 mg Oral Q3H PRN Corky Mull, MD      . pantoprazole (PROTONIX) EC tablet 40 mg  40 mg Oral BID Corky Mull, MD   40 mg at 01/25/16 1359  . simvastatin (ZOCOR) tablet 40 mg  40 mg Oral q1800 Corky Mull, MD      . sodium phosphate (FLEET) 7-19 GM/118ML enema 1 enema  1 enema Rectal Once PRN Corky Mull, MD      .  vitamin B-12 (CYANOCOBALAMIN) tablet 1,000 mcg  1,000 mcg Oral Daily Corky Mull, MD   1,000  mcg at 01/25/16 1358     Discharge Medications: Please see discharge summary for a list of discharge medications.  Relevant Imaging Results:  Relevant Lab Results:   Additional Information  (SSN: [id number])  Detta Mellin, Veronia Beets, LCSW

## 2016-01-25 NOTE — Evaluation (Signed)
Physical Therapy Evaluation Patient Details Name: Raymond [name] MRN: [id number] DOB: [birth year] Today's Date: 01/25/2016   History of Present Illness  admitted for acute hospitalization status post L THR, posterior approach, WBAT (01/25/16).  Clinical Impression  Upon evaluation, patient alert and oriented; follows all commands and demonstrates good safety awareness/insight.  L LE post-op strength and ROM grossly WFL; generally guarded due to pain, but with good efforts/participation during all therex/theract.  Min cuing for awareness/adherence to L LE THPs with all functional activities. Able to complete bed mobility with close sup; sit/stand, basic transfers and gait (25') with RW, cga.  Fair/good weight acceptance and stability L LE; minimal increase in pain reported with activity.  No buckling, LOB or significant safety concerns appreciated. Would benefit from skilled PT to address above deficits and promote optimal return to PLOF; Recommend transition to [place] upon discharge from acute hospitalization.  Anticipate consistent progression towards all mobility goals.     Follow Up Recommendations Home health PT    Equipment Recommendations  Rolling walker with 5" wheels    Recommendations for Other Services       Precautions / Restrictions Precautions Precautions: Posterior Hip;Fall Restrictions Weight Bearing Restrictions: Yes LLE Weight Bearing: Weight bearing as tolerated      Mobility  Bed Mobility Overal bed mobility: Needs Assistance Bed Mobility: Supine to Sit     Supine to sit: Supervision     General bed mobility comments: cuing for awareness of L hip position  Transfers Overall transfer level: Needs assistance Equipment used: Rolling walker (2 wheeled) Transfers: Sit to/from Stand Sit to Stand: Min guard         General transfer comment: cuing for hand placement and WBAT to L LE  Ambulation/Gait Ambulation/Gait assistance: Min guard Ambulation Distance  (Feet): 25 Feet Assistive device: Rolling walker (2 wheeled)       General Gait Details: partially reciprocal stepping pattern with fair weight acceptance/stance time L LE; min cuing for LE position (esp with turn negotiation) to maintain THPs.  No buckling or LOB; good confidence/effort with all mobility.  Stairs            Wheelchair Mobility    Modified Rankin (Stroke Patients Only)       Balance Overall balance assessment: Needs assistance Sitting-balance support: No upper extremity supported;Feet supported Sitting balance-Leahy Scale: Good     Standing balance support: Bilateral upper extremity supported Standing balance-Leahy Scale: Good                               Pertinent Vitals/Pain Pain Assessment: 0-10 Pain Score: 3  Pain Location: L hip Pain Descriptors / Indicators: Aching Pain Intervention(s): Limited activity within patient's tolerance;Monitored during session;Premedicated before session;Repositioned    Home Living Family/patient expects to be discharged to:: Private residence Living Arrangements: Spouse/significant other Available Help at Discharge: Family;Available 24 hours/day (wife works from home and is available to assist as needed) Type of Home: House Home Access: Stairs to enter Entrance Stairs-Rails: None Entrance Stairs-Number of Steps: 3 Home Layout: Two level;Bed/bath upstairs (does have full bath on main floor if needed) Home Equipment:  (has access to walker-unsure if it has wheels or not)      Prior Function Level of Independence: Independent         Comments: Indep with ADLs, household and community mobility without assist device; working full-time as Curator  Extremity/Trunk Assessment   Upper Extremity Assessment: Overall WFL for tasks assessed           Lower Extremity Assessment:  (L hip grossly 3-/5, limited by pain/soreness post-op; full sensory return  ntoed)         Communication   Communication: No difficulties  Cognition Arousal/Alertness: Awake/alert Behavior During Therapy: WFL for tasks assessed/performed Overall Cognitive Status: Within Functional Limits for tasks assessed                      General Comments      Exercises Other Exercises Other Exercises: Supine LE therex, 1x10, AROM for muscular strength/endurance with functional activities: ankle pumps, quad sets, SAQs, heel slides, hip abduct/adduct. Other Exercises: Verbally reviewed L LE THPs and functional implications; patient/wife voiced understanding, min assist/cuing for adherence with functional mobility.   Assessment/Plan    PT Assessment Patient needs continued PT services  PT Problem List Decreased strength;Decreased range of motion;Decreased activity tolerance;Decreased balance;Decreased mobility;Decreased knowledge of use of DME;Decreased safety awareness;Decreased knowledge of precautions;Obesity;Pain;Decreased skin integrity          PT Treatment Interventions DME instruction;Gait training;Stair training;Functional mobility training;Therapeutic activities;Therapeutic exercise;Balance training;Patient/family education    PT Goals (Current goals can be found in the Care Plan section)  Acute Rehab PT Goals Patient Stated Goal: to return home with wife PT Goal Formulation: With patient/family Time For Goal Achievement: 02/08/16 Potential to Achieve Goals: Good    Frequency BID   Barriers to discharge        Co-evaluation               End of Session Equipment Utilized During Treatment: Gait belt Activity Tolerance: Patient tolerated treatment well Patient left: in chair;with call bell/phone within reach;with chair alarm set;with family/visitor present Nurse Communication: Mobility status         Time: [id number] PT Time Calculation (min) (ACUTE ONLY): 35 min   Charges:   PT Evaluation $PT Eval Low Complexity: 1  Procedure PT Treatments $Therapeutic Exercise: 8-22 mins   PT G Codes:        [name], PT, DPT, NCS 01/25/16, 3:44 PM [phone]

## 2016-01-25 NOTE — Anesthesia Procedure Notes (Signed)
Procedure Name: Intubation Date/Time: 01/25/2016 7:38 AM Performed by: Doreen Salvage Pre-anesthesia Checklist: Patient identified, Patient being monitored, Timeout performed, Emergency Drugs available and Suction available Patient Re-evaluated:Patient Re-evaluated prior to inductionOxygen Delivery Method: Circle system utilized Preoxygenation: Pre-oxygenation with 100% oxygen Intubation Type: IV induction Ventilation: Mask ventilation without difficulty Laryngoscope Size: Mac and 4 Grade View: Grade II Tube type: Oral Tube size: 7.0 mm Number of attempts: 1 Airway Equipment and Method: Stylet Placement Confirmation: ETT inserted through vocal cords under direct vision,  positive ETCO2 and breath sounds checked- equal and bilateral Secured at: 21 cm Tube secured with: Tape Dental Injury: Teeth and Oropharynx as per pre-operative assessment

## 2016-01-25 NOTE — Care Management (Signed)
Notified Dr. Roland Rack for need to set up a OP PT plan/appointment for patient in the event [employer] not able to approve [facility] PT.

## 2016-01-25 NOTE — Anesthesia Preprocedure Evaluation (Signed)
Anesthesia Evaluation  Patient identified by MRN, date of birth, ID band Patient awake    Reviewed: Allergy & Precautions, H&P , NPO status , Patient's Chart, lab work & pertinent test results  Airway Mallampati: II  TM Distance: >3 FB Neck ROM: full    Dental no notable dental hx.    Pulmonary asthma , sleep apnea ,    Pulmonary exam normal        Cardiovascular hypertension, On Medications  Rhythm:regular Rate:Normal     Neuro/Psych PSYCHIATRIC DISORDERS Depression  Neuromuscular disease    GI/Hepatic GERD  Medicated and Controlled,  Endo/Other  diabetes, Well Controlled  Renal/GU   negative genitourinary   Musculoskeletal  (+) Arthritis , Osteoarthritis,    Abdominal   Peds negative pediatric ROS (+)  Hematology   Anesthesia Other Findings   Reproductive/Obstetrics                             Anesthesia Physical  Anesthesia Plan  ASA: II  Anesthesia Plan:    Post-op Pain Management:    Induction: Intravenous and Rapid sequence  Airway Management Planned: Oral ETT  Additional Equipment:   Intra-op Plan:   Post-operative Plan: Extubation in OR  Informed Consent: I have reviewed the patients History and Physical, chart, labs and discussed the procedure including the risks, benefits and alternatives for the proposed anesthesia with the patient or authorized representative who has indicated his/her understanding and acceptance.     Plan Discussed with: CRNA  Anesthesia Plan Comments:         Anesthesia Quick Evaluation

## 2016-01-25 NOTE — Op Note (Signed)
01/25/2016  10:41 AM  Patient:   Raymond [name]  Pre-Op Diagnosis:   Degenerative joint disease, right hip.  Post-Op Diagnosis:   Same.  Procedure:   Right total hip arthroplasty.  Surgeon:   Pascal Lux, MD  Assistant:   Cameron Proud, PA-C  Anesthesia:   GET  Findings:   As above.  Complications:   None  EBL:   300 cc  Fluids:   2300 cc crystalloid  UOP:   240 cc  TT:   None  Drains:   None  Closure:   Staples  Implants:   Biomet press-fit system with a #13 lateral offset Echo femoral stem, a 56 mm acetabular shell with an E-poly hi-wall liner, and a 36 mm ceramic head with a -3 mm neck.  Brief Clinical Note:   The patient is a 60 year old male with a history of progressively worsening left hip pain. His symptoms have progressed despite medications, activity modification, etc. His history and examination are consistent with degenerative joint disease of the left hip confirmed by plain radiographs. He presents at this time for a left total hip arthroplasty..   Procedure:   The patient was brought into the operating room. After adequate general endotracheal intubation and anesthesia was obtained, the patient was repositioned in the right lateral decubitus position and secured using a lateral hip positioner. The left hip and lower extremity were prepped with ChloroPrep solution before being draped sterilely. Preoperative antibiotics were administered. A timeout was performed to verify the appropriate surgical site before a standard posterior approach to the hip was made through an approximately 6-7 inch incision. The incision was carried down through the subcutaneous tissues to expose the gluteal fascia and proximal end of the iliotibial band. These structures were split the length of the incision and the Charnley self-retaining hip retractor placed. The bursal tissues were swept posteriorly to expose the short external rotators. The anterior border of the piriformis tendon  was identified and this plane developed down through the capsule to enter the joint. A flap of tissue was elevated off the posterior aspect of the femoral neck and greater trochanter and retracted posteriorly. This flap included the piriformis tendon, the short external rotators, and the posterior capsule. The soft tissues were elevated off the lateral aspect of the ilium and a large Steinmann pin placed bicortically. With the left leg aligned over the right leg, a drill bit was placed into the greater trochanter parallel to the Steinmann pin and the distance between these two pins measured in order to optimize leg lengths postoperatively. The drill bit was removed and the hip dislocated. The piriformis fossa was debrided of soft tissues before the intramedullary canal was accessed through this point using a triple step reamer. The canal was reamed sequentially beginning with a #7 tapered reamer and progressing to a #13 tapered reamer. This provided excellent circumferential chatter. Using the appropriate guide, a femoral neck cut was made 10-12 mm above the lesser trochanter. The femoral head was removed.  Attention was directed to the acetabular side. The labrum was debrided circumferentially before the ligamentum teres was removed using a large curette. A line was drawn on the drapes corresponding to the native version of the acetabulum. This line was used as a guide while the acetabulum was reamed sequentially beginning with a 48 mm reamer and progressing to a 55 mm reamer. This provided excellent circumferential chatter. The 55 mm trial acetabulum was positioned and found to fit quite well. Therefore, the  56 mm acetabular shell was selected and impacted into place with care taken to maintain the appropriate version. The trial high wall liner was inserted.  Attention was redirected to the femoral side. A box osteotome was used to establish version before the canal was broached sequentially beginning with a  #7 broach and progressing to a #13 broach. This was left in place and several trial reductions performed using both a standard and laterally offset neck options, as well as the -6 mm and -3 mm neck lengths. The permanent #13 lateral offset femoral stem was impacted into place. A repeat trial reduction was performed using the -3 mm neck length. The -3 mm mm neck length demonstrated excellent stability both in extension and external rotation as well as with flexion to 90 and internal rotation beyond 70. It also was stable in the position of sleep. In addition, leg lengths appeared to be restored appropriately, both by reassessing the position of the right leg over the left, as well as by measuring the distance between the Steinmann pin and the drill bit. The 36 mm metallic head with the -3 mm neck length was selected and impacted onto the stem of the femoral component. The Morse taper locking mechanism was verified using manual distraction before the head was relocated and placed through a range of motion with the findings as described above.  The wound was copiously irrigated with bacitracin saline solution via the jet lavage system before the peri-incisional and pericapsular tissues were injected with 30 cc of 0.5% Sensorcaine with epinephrine and 20 cc of Exparel diluted out to 60 cc with normal saline to help with postoperative analgesia. The posterior flap was reapproximated to the posterior aspect of the greater trochanter using #5 Ethibond interrupted sutures placed through drill holes. Several additional #5 Ethibond interrupted sutures were used to reinforce this layer of closure. The iliotibial band was reapproximated using #1 Vicryl interrupted sutures before the gluteal fascia was closed using a running #1 Vicryl suture. At this point, 1 g of transexemic acid in 10 cc of normal saline was injected into the joint to help reduce postoperative bleeding. The subcutaneous tissues were closed in several  layers using 2-0 Vicryl interrupted sutures before the skin was closed using staples. A sterile occlusive dressing was applied to the wound before the patient was placed into an abduction wedge pillow. The patient was then rolled back into the supine position on his hospital bed before being awakened, extubated, and returned to the recovery room in satisfactory condition after tolerating the procedure well.

## 2016-01-25 NOTE — Transfer of Care (Signed)
Immediate Anesthesia Transfer of Care Note  Patient: Raymond [name]  Procedure(s) Performed: Procedure(s): TOTAL HIP ARTHROPLASTY (Left)  Patient Location: PACU  Anesthesia Type:General  Level of Consciousness: awake  Airway & Oxygen Therapy: Patient Spontanous Breathing and Patient connected to face mask oxygen  Post-op Assessment: Report given to RN and Post -op Vital signs reviewed and stable  Post vital signs: Reviewed and stable  Last Vitals:  Vitals:   01/25/16 0627 01/25/16 1032  BP: (!) 155/85 (!) 141/80  Pulse: 86 81  Resp: 18 15  Temp: 36.3 C 36.7 C    Last Pain:  Vitals:   01/25/16 0627  TempSrc: Oral  PainSc: 2       Patients Stated Pain Goal: 0 ([id number])  Complications: No apparent anesthesia complications

## 2016-01-26 LAB — BASIC METABOLIC PANEL
Anion gap: 6 (ref 5–15)
BUN: 11 mg/dL (ref 6–20)
CHLORIDE: 99 mmol/L — AB (ref 101–111)
CO2: 33 mmol/L — ABNORMAL HIGH (ref 22–32)
Calcium: 8.5 mg/dL — ABNORMAL LOW (ref 8.9–10.3)
Creatinine, Ser: 0.76 mg/dL (ref 0.61–1.24)
GFR calc Af Amer: >60 mL/min (ref >60)
GFR calc non Af Amer: >60 mL/min (ref >60)
GLUCOSE: 177 mg/dL — AB (ref 65–99)
POTASSIUM: 3.4 mmol/L — AB (ref 3.5–5.1)
Sodium: 138 mmol/L (ref 135–145)

## 2016-01-26 LAB — CBC WITH DIFFERENTIAL/PLATELET
Basophils Absolute: 0 10*3/uL (ref 0–0.1)
Basophils Relative: 0 %
EOS PCT: 2 %
Eosinophils Absolute: 0.2 10*3/uL (ref 0–0.7)
HCT: 37.1 % — ABNORMAL LOW (ref 40.0–52.0)
Hemoglobin: 13.1 g/dL (ref 13.0–18.0)
LYMPHS ABS: 1.4 10*3/uL (ref 1.0–3.6)
LYMPHS PCT: 14 %
MCH: 29.6 pg (ref 26.0–34.0)
MCHC: 35.2 g/dL (ref 32.0–36.0)
MCV: 84.1 fL (ref 80.0–100.0)
Monocytes Absolute: 1.6 10*3/uL — ABNORMAL HIGH (ref 0.2–1.0)
Monocytes Relative: 15 %
Neutro Abs: 7.2 10*3/uL — ABNORMAL HIGH (ref 1.4–6.5)
Neutrophils Relative %: 69 %
PLATELETS: 179 10*3/uL (ref 150–440)
RBC: 4.41 MIL/uL (ref 4.40–5.90)
RDW: 13.3 % (ref 11.5–14.5)
WBC: 10.4 10*3/uL (ref 3.8–10.6)

## 2016-01-26 MED ORDER — POTASSIUM CHLORIDE 20 MEQ PO PACK
20.0000 meq | PACK | Freq: Two times a day (BID) | ORAL | Status: AC
  Administered 2016-01-26 (×2): 20 meq via ORAL
  Filled 2016-01-26 (×2): qty 1

## 2016-01-26 NOTE — Progress Notes (Signed)
Physical Therapy Treatment Patient Details Name: DEMITRY SHELOR MRN: [id number] DOB: 1955-05-13 Today's Date: 01/26/2016    History of Present Illness admitted for acute hospitalization status post L THR, posterior approach, WBAT (01/25/16).    PT Comments    Participated in exercises as described below.  Pt able to ambulate to/from rehab gym and participate in stair training with min guard.  He reports having no rails on 2 entrances and 1 rail with 10 steps at the front of his home.  Pt encouraged to have rails installed on all entrances for ease and safety.  He required education for stair sequencing and did well overall.  Gait generally steady without loss of balance or buckling.   Follow Up Recommendations  Home health PT     Equipment Recommendations  Rolling walker with 5" wheels    Recommendations for Other Services       Precautions / Restrictions Precautions Precautions: Posterior Hip;Fall Restrictions Weight Bearing Restrictions: Yes LLE Weight Bearing: Weight bearing as tolerated    Mobility  Bed Mobility Overal bed mobility: Needs Assistance Bed Mobility: Supine to Sit     Supine to sit: Supervision        Transfers Overall transfer level: Needs assistance Equipment used: Rolling walker (2 wheeled) Transfers: Sit to/from Stand Sit to Stand: Min guard         General transfer comment: cuing for hand placement and WBAT to L LE  Ambulation/Gait Ambulation/Gait assistance: Min guard Ambulation Distance (Feet): 120 Feet Assistive device: Rolling walker (2 wheeled) Gait Pattern/deviations: Step-through pattern   Gait velocity interpretation: Below normal speed for age/gender General Gait Details: uneven stride length with varried speed.  vc's to slow down for safety   Stairs Stairs: Yes Stairs assistance: Min guard Stair Management: One rail Left;Two rails Number of Stairs: 8 General stair comments: x 4 with bilateral rails, X 4 with left rail.   has 2 sets of stairs with no rails into house and 10 steps with 1 rail.  Encoruaged pt to have rails installed on all steps in/out of home for safety  Wheelchair Mobility    Modified Rankin (Stroke Patients Only)       Balance Overall balance assessment: Needs assistance Sitting-balance support: Feet supported Sitting balance-Leahy Scale: Good     Standing balance support: Bilateral upper extremity supported Standing balance-Leahy Scale: Good                      Cognition Arousal/Alertness: Awake/alert Behavior During Therapy: WFL for tasks assessed/performed Overall Cognitive Status: Within Functional Limits for tasks assessed                      Exercises Total Joint Exercises Ankle Circles/Pumps: AROM;10 reps;Both Quad Sets: AROM;Both;10 reps Gluteal Sets: AROM;Both;10 reps Heel Slides: AROM;Left;10 reps Hip ABduction/ADduction: AROM;Left;10 reps Long Arc Quad: AROM;Left;10 reps Marching in Standing: AROM;Left;10 reps Standing Hip Extension: AROM;Left;10 reps    General Comments        Pertinent Vitals/Pain Pain Assessment: 0-10 Pain Score: 4  Pain Location: L h ip Pain Descriptors / Indicators: Sore;Operative site guarding Pain Intervention(s): Limited activity within patient's tolerance;Premedicated before session    Home Living                      Prior Function            PT Goals (current goals can now be found in the care plan section) Progress  towards PT goals: Progressing toward goals    Frequency    BID      PT Plan Current plan remains appropriate    Co-evaluation             End of Session Equipment Utilized During Treatment: Gait belt Activity Tolerance: Patient tolerated treatment well Patient left: in chair;with chair alarm set;with call bell/phone within reach     Time: 0956-0919 PT Time Calculation (min) (ACUTE ONLY): 1403 min  Charges:  $Gait Training: 8-22 mins $Therapeutic Exercise:  8-22 mins                    G Codes:      Chesley Noon 02/22/16, 10:07 AM

## 2016-01-26 NOTE — Progress Notes (Signed)
Subjective: 1 Day Post-Op Procedure(s) (LRB): TOTAL HIP ARTHROPLASTY (Left) Patient reports pain as mild.   Patient is well, and has had no acute complaints or problems Plan is to go Home after hospital stay. Negative for chest pain and shortness of breath Fever: no Gastrointestinal:Negative for nausea and vomiting  Objective: Vital signs in last 24 hours: Temp:  [97.8 F (36.6 C)-98.6 F (37 C)] 97.8 F (36.6 C) (12/06 0456) Pulse Rate:  [76-83] 83 (12/06 0456) Resp:  [9-19] 18 (12/06 0456) BP: (125-155)/(63-81) 151/75 (12/06 0456) SpO2:  [92 %-100 %] 92 % (12/06 0456) FiO2 (%):  [21 %] 21 % (12/05 1145)  Intake/Output from previous day:  Intake/Output Summary (Last 24 hours) at 01/26/16 0723 Last data filed at 01/26/16 0500  Gross per 24 hour  Intake             6375 ml  Output             4260 ml  Net             2115 ml    Intake/Output this shift: No intake/output data recorded.  Labs:  Recent Labs  01/26/16 0316  HGB 13.1    Recent Labs  01/26/16 0316  WBC 10.4  RBC 4.41  HCT 37.1*  PLT 179    Recent Labs  01/26/16 0316  NA 138  K 3.4*  CL 99*  CO2 33*  BUN 11  CREATININE 0.76  GLUCOSE 177*  CALCIUM 8.5*   No results for input(s): LABPT, INR in the last 72 hours.   EXAM General - Patient is Alert, Appropriate and Oriented Extremity - Sensation intact distally Intact pulses distally Dorsiflexion/Plantar flexion intact Incision: dressing C/D/I No cellulitis present Dressing/Incision - clean, dry, no drainage Motor Function - intact, moving foot and toes well on exam.   Abdomen is soft on exam, mild tympany.  Normal BS.  Past Medical History:  Diagnosis Date  . Allergy   . Arthritis   . Asthma    mild  . Carpal tunnel syndrome    bilateral  . Depression   . Diabetes mellitus without complication ([place])    Type II  . GERD (gastroesophageal reflux disease)   . Hyperlipidemia   . Hypertension    neg stress test 10 yrs ago - Dr  Humphrey Rolls  . Sleep apnea    has BiPAP -doesn't use  . Varicose vein    treated by Dr. Delana Meyer    Assessment/Plan: 1 Day Post-Op Procedure(s) (LRB): TOTAL HIP ARTHROPLASTY (Left) Active Problems:   Status post total hip replacement, left  Estimated body mass index is 36.62 kg/m as calculated from the following:   Height as of this encounter: 6' (1.829 m).   Weight as of this encounter: 122.5 kg (270 lb). Advance diet Up with therapy D/C IV fluids when tolerating po intake.  Labs reviewed, hypokalemia (3.4) will supplement. No elevated WBC. Up with therapy today, begin working on BM, suppositories as needed. Plan will be for discharge home tomorrow pending BM.  DVT Prophylaxis - Lovenox, Foot Pumps and TED hose Weight-Bearing as tolerated to left leg  J. Cameron Proud, PA-C [facility] 01/26/2016, 7:23 AM

## 2016-01-26 NOTE — Progress Notes (Signed)
Physical Therapy Treatment Patient Details Name: Raymond [name] MRN: [id number] DOB: 1955/02/23 Today's Date: 02/20/2016    History of Present Illness admitted for acute hospitalization status post L THR, posterior approach, WBAT (01/25/16).    PT Comments    Participated in exercises as described below.  Pt able to ambulate x 1 around nursing station this pm. Min a for le's into bed.  Progressing well towards goals.  Follow Up Recommendations  Home health PT     Equipment Recommendations  Rolling walker with 5" wheels    Recommendations for Other Services       Precautions / Restrictions Precautions Precautions: Posterior Hip;Fall Restrictions Weight Bearing Restrictions: Yes LLE Weight Bearing: Weight bearing as tolerated    Mobility  Bed Mobility Overal bed mobility: Needs Assistance Bed Mobility: Sit to Supine     Supine to sit: Min assist     General bed mobility comments: L management  Transfers Overall transfer level: Needs assistance Equipment used: Rolling walker (2 wheeled) Transfers: Sit to/from Stand Sit to Stand: Min guard            Ambulation/Gait   Ambulation Distance (Feet): 220 Feet Assistive device: Rolling walker (2 wheeled) Gait Pattern/deviations: Step-through pattern   Gait velocity interpretation: Below normal speed for age/gender General Gait Details: uneven stride length with varried speed.  vc's to slow down for safety   Stairs            Wheelchair Mobility    Modified Rankin (Stroke Patients Only)       Balance Overall balance assessment: Needs assistance Sitting-balance support: Feet supported Sitting balance-Leahy Scale: Good     Standing balance support: Bilateral upper extremity supported Standing balance-Leahy Scale: Good                      Cognition Arousal/Alertness: Awake/alert Behavior During Therapy: WFL for tasks assessed/performed Overall Cognitive Status: Within Functional Limits  for tasks assessed                      Exercises Total Joint Exercises Hip ABduction/ADduction: AROM;Left;10 reps;Standing Straight Leg Raises: AROM;Left;Standing;10 reps Marching in Standing: AROM;Left;10 reps    General Comments        Pertinent Vitals/Pain Pain Assessment: 0-10 Pain Score: 5  Pain Location: L hip Pain Descriptors / Indicators: Sore Pain Intervention(s): Limited activity within patient's tolerance    Home Living                      Prior Function            PT Goals (current goals can now be found in the care plan section) Progress towards PT goals: Progressing toward goals    Frequency    BID      PT Plan Current plan remains appropriate    Co-evaluation             End of Session Equipment Utilized During Treatment: Gait belt Activity Tolerance: Patient tolerated treatment well Patient left: in bed;with bed alarm set;with call bell/phone within reach     Time: 1210-1220 PT Time Calculation (min) (ACUTE ONLY): 10 min  Charges:  $Gait Training: 8-22 mins $Therapeutic Exercise: 8-22 mins                    G Codes:      Chesley Noon February 20, 2016, 2:39 PM

## 2016-01-26 NOTE — Care Management (Signed)
Patient has a walker and will need no DME. Pharmacy: [facility] [phone]. Called Lovenox 40 mg # 14 no refills

## 2016-01-26 NOTE — Progress Notes (Signed)
Clinical Social Worker (CSW) received SNF consult. PT is recommending home health. RN case manager is aware of above. Please reconsult if future social work needs arise. CSW signing off.   Damyah Gugel, LCSW [phone] 

## 2016-01-26 NOTE — Care Management (Signed)
Faxed Clinical to [facility] ( [phone]).

## 2016-01-26 NOTE — Progress Notes (Signed)
Notified Dr. Roland Rack that patient reports that he twisted his surgical leg when trying to sit in the recliner with PT due to the recliner rolling. Patient states the wheels were not locked.

## 2016-01-27 LAB — SURGICAL PATHOLOGY

## 2016-01-27 LAB — BASIC METABOLIC PANEL WITH GFR
Anion gap: 9 (ref 5–15)
BUN: 13 mg/dL (ref 6–20)
CO2: 31 mmol/L (ref 22–32)
Calcium: 8.7 mg/dL — ABNORMAL LOW (ref 8.9–10.3)
Chloride: 95 mmol/L — ABNORMAL LOW (ref 101–111)
Creatinine, Ser: 0.78 mg/dL (ref 0.61–1.24)
GFR calc Af Amer: >60 mL/min
GFR calc non Af Amer: >60 mL/min
Glucose, Bld: 162 mg/dL — ABNORMAL HIGH (ref 65–99)
Potassium: 3.1 mmol/L — ABNORMAL LOW (ref 3.5–5.1)
Sodium: 135 mmol/L (ref 135–145)

## 2016-01-27 LAB — GLUCOSE, CAPILLARY
GLUCOSE-CAPILLARY: 147 mg/dL — AB (ref 65–99)
GLUCOSE-CAPILLARY: 182 mg/dL — AB (ref 65–99)

## 2016-01-27 LAB — POTASSIUM: POTASSIUM: 3.5 mmol/L (ref 3.5–5.1)

## 2016-01-27 MED ORDER — INSULIN ASPART 100 UNIT/ML ~~LOC~~ SOLN
0.0000 [IU] | Freq: Three times a day (TID) | SUBCUTANEOUS | Status: DC
  Administered 2016-01-27: 1 [IU] via SUBCUTANEOUS
  Administered 2016-01-27: 2 [IU] via SUBCUTANEOUS
  Filled 2016-01-27 (×2): qty 2
  Filled 2016-01-27: qty 1

## 2016-01-27 MED ORDER — OXYCODONE HCL 5 MG PO TABS: 5.0000 mg | ORAL_TABLET | ORAL | 0 refills | Status: DC | PRN

## 2016-01-27 MED ORDER — INSULIN ASPART 100 UNIT/ML ~~LOC~~ SOLN: 0.0000 [IU] | Freq: Every day | SUBCUTANEOUS | Status: DC

## 2016-01-27 MED ORDER — POTASSIUM CHLORIDE CRYS ER 20 MEQ PO TBCR
40.0000 meq | EXTENDED_RELEASE_TABLET | Freq: Once | ORAL | Status: AC
  Administered 2016-01-27: 40 meq via ORAL
  Filled 2016-01-27: qty 2

## 2016-01-27 MED ORDER — POTASSIUM CHLORIDE CRYS ER 20 MEQ PO TBCR
40.0000 meq | EXTENDED_RELEASE_TABLET | ORAL | Status: AC
  Administered 2016-01-27: 40 meq via ORAL
  Filled 2016-01-27 (×2): qty 2

## 2016-01-27 NOTE — Progress Notes (Signed)
DISCHARGE NOTE:  Pt given discharge instructions and prescriptions. (oxycodone). Pt verbalized understanding. Pt wheeled to car by staff.

## 2016-01-27 NOTE — Discharge Instructions (Signed)

## 2016-01-27 NOTE — Care Management (Signed)
Spoke with Claris Gower [phone]) with [facility]. Patient is scheduled to start OP PT Dec. 11 at 2:30. Patient is able to secure a ride to OP PT. He does not want HH PT.

## 2016-01-27 NOTE — Discharge Summary (Signed)
Physician Discharge Summary  Patient ID: Raymond [name] MRN: [id number] DOB/AGE: 61-11-1955 61 y.o.  Admit date: 01/25/2016 Discharge date: 01/27/2016  Admission Diagnoses:  PRIMARY OSTEOARTHRITIS LEFT HIP  Right hip degenerative joint disease.  Discharge Diagnoses: Patient Active Problem List   Diagnosis Date Noted  . Status post total hip replacement, left 01/25/2016  . Essential hypertension 07/21/2015  . Depression 07/21/2015  . Hyperlipidemia 07/21/2015  . GERD (gastroesophageal reflux disease) 12/04/2014  . Biceps tendinitis 07/21/2014  . Impingement syndrome of shoulder 07/21/2014  . Arthritis of knee, degenerative 08/14/2013  . Neuritis or radiculitis due to rupture of lumbar intervertebral disc 08/14/2013  Right hip degenerative joint disease.  Past Medical History:  Diagnosis Date  . Allergy   . Arthritis   . Asthma    mild  . Carpal tunnel syndrome    bilateral  . Depression   . Diabetes mellitus without complication ([place])    Type II  . GERD (gastroesophageal reflux disease)   . Hyperlipidemia   . Hypertension    neg stress test 10 yrs ago - Dr Humphrey Rolls  . Sleep apnea    has BiPAP -doesn't use  . Varicose vein    treated by Dr. Delana Meyer   Transfusion: None   Consultants (if any):   Discharged Condition: Improved  Hospital Course: BEJAN JACOBS is an 60 y.o. male who was admitted 01/25/2016 with a diagnosis of right hip degenerative joint disease and went to the operating room on 01/25/2016 and underwent the above named procedures.    Surgeries: Procedure(s): TOTAL HIP ARTHROPLASTY on 01/25/2016 Patient tolerated the surgery well. Taken to PACU where she was stabilized and then transferred to the orthopedic floor.  Started on Lovenox 40mg  q 24 hrs. Foot pumps applied bilaterally at 80 mm. Heels elevated on bed with rolled towels. No evidence of DVT. Negative Homan. Physical therapy started on day #1 for gait training and transfer. OT started day #1 for  ADL and assisted devices.  Patient's IV and foley was d/c on POD1.  Implants: Biomet press-fit system with a #13 lateral offset Echo femoral stem, a 56 mm acetabular shell with an E-poly hi-wall liner, and a 36 mm ceramic head with a -3 mm neck.  He was given perioperative antibiotics:  Anti-infectives    Start     Dose/Rate Route Frequency Ordered Stop   01/25/16 1200  ceFAZolin (ANCEF) 3 g in dextrose 5 % 50 mL IVPB     3 g 130 mL/hr over 30 Minutes Intravenous Every 6 hours 01/25/16 1144 01/25/16 2330   01/25/16 0200  vancomycin (VANCOCIN) 1,500 mg in sodium chloride 0.9 % 500 mL IVPB     1,500 mg 250 mL/hr over 120 Minutes Intravenous  Once 01/25/16 0156 01/25/16 0901    .  He was given sequential compression devices, early ambulation, and Lovenox for DVT prophylaxis.  He benefited maximally from the hospital stay.  Mild complication with hypokalemia which was treated with potassium supplementation.  Recent vital signs:  Vitals:   01/27/16 0422 01/27/16 0747  BP: 138/67 139/64  Pulse: 85 84  Resp:  18  Temp: 98.5 F (36.9 C) 98.3 F (36.8 C)    Recent laboratory studies:  Lab Results  Component Value Date   HGB 13.1 01/26/2016   HGB 15.3 01/12/2016   Lab Results  Component Value Date   WBC 10.4 01/26/2016   PLT 179 01/26/2016   Lab Results  Component Value Date   INR 1.01 01/12/2016  Lab Results  Component Value Date   NA 135 01/27/2016   K 3.1 (L) 01/27/2016   CL 95 (L) 01/27/2016   CO2 31 01/27/2016   BUN 13 01/27/2016   CREATININE 0.78 01/27/2016   GLUCOSE 162 (H) 01/27/2016    Discharge Medications:     Medication List    TAKE these medications   ACCU-CHEK AVIVA PLUS test strip Generic drug:  glucose blood TEST AS DIRECTED   amLODipine 10 MG tablet Commonly known as:  NORVASC TAKE 1 TABLET BY MOUTH  DAILY IN THE MORNING What changed:  how much to take  how to take this  when to take this  additional instructions   aspirin 81 MG  tablet Take 81 mg by mouth daily. AM   buPROPion 75 MG tablet Commonly known as:  WELLBUTRIN Take 1 tablet by mouth  daily   buPROPion 75 MG tablet Commonly known as:  WELLBUTRIN TAKE 1 TABLET BY MOUTH  DAILY   Cholecalciferol 1000 units capsule Take 1,000 Units by mouth daily. AM   cyanocobalamin 1000 MCG tablet Take 1,000 mcg by mouth daily. AM   cyclobenzaprine 10 MG tablet Commonly known as:  FLEXERIL TAKE 1 TABLET BY MOUTH TWICE A DAY AS NEEDED FOR MUSCLE SPASMS FOR UP TO 10 DAYS   dapagliflozin propanediol 5 MG Tabs tablet Commonly known as:  FARXIGA Take 5 mg by mouth daily.   escitalopram 20 MG tablet Commonly known as:  LEXAPRO Take 1 tablet by mouth  daily   esomeprazole 40 MG capsule Commonly known as:  NEXIUM TAKE 1 CAPSULE BY MOUTH  DAILY AT 12 NOON What changed:  See the new instructions.   fluticasone 50 MCG/ACT nasal spray Commonly known as:  FLONASE Place 2 sprays into both nostrils daily. AM   gabapentin 300 MG capsule Commonly known as:  NEURONTIN TAKE 1 CAPSULE BY MOUTH 3  TIMES A DAY   glucosamine-chondroitin 500-400 MG tablet Take 1 tablet by mouth daily.   hydrochlorothiazide 25 MG tablet Commonly known as:  HYDRODIURIL TAKE 1 TABLET BY MOUTH  DAILY IN THE MORNING What changed:  how much to take  how to take this  when to take this  additional instructions   losartan 50 MG tablet Commonly known as:  COZAAR Take 1 tablet by mouth  daily What changed:  how much to take  how to take this  when to take this  additional instructions   meloxicam 7.5 MG tablet Commonly known as:  MOBIC Take 1 tablet (7.5 mg total) by mouth 2 (two) times daily. Marylee Floras   metFORMIN 500 MG tablet Commonly known as:  GLUCOPHAGE Take 1 tablet by mouth two  times daily with meals What changed:  how much to take  how to take this  when to take this  additional instructions   omega-3 fish oil 1000 MG Caps capsule Commonly known as:   MAXEPA Take 1 capsule by mouth daily. AM   oxyCODONE 5 MG immediate release tablet Commonly known as:  Oxy IR/ROXICODONE Take 1-2 tablets (5-10 mg total) by mouth every 4 (four) hours as needed for breakthrough pain.   simvastatin 40 MG tablet Commonly known as:  ZOCOR Take 1 tablet by mouth at  bedtime What changed:  how much to take  how to take this  when to take this  additional instructions   VICTOZA 18 MG/3ML Sopn Generic drug:  liraglutide INJECT 1.8 MG SUBCUTANEOUSLY EVERY DAY What changed:  See the new  instructions.            Durable Medical Equipment        Start     Ordered   01/25/16 1145  DME Walker rolling  Once    Question:  Patient needs a walker to treat with the following condition  Answer:  Status post total hip replacement, left   01/25/16 1144   01/25/16 1145  DME 3 n 1  Once     01/25/16 1144   01/25/16 1145  DME Bedside commode  Once    Question:  Patient needs a bedside commode to treat with the following condition  Answer:  Status post total hip replacement, left   01/25/16 1144      Diagnostic Studies: Dg Chest 2 View  Result Date: 01/12/2016 CLINICAL DATA:  Preoperative evaluation for upcoming knee replacement EXAM: CHEST  2 VIEW COMPARISON:  None. FINDINGS: The heart size and mediastinal contours are within normal limits. Both lungs are clear. The visualized skeletal structures show degenerative change of the thoracic spine. IMPRESSION: No active cardiopulmonary disease. Electronically Signed   By: Inez Catalina M.D.   On: 01/12/2016 10:17   Dg Hip Port Unilat With Pelvis 1v Left  Result Date: 01/25/2016 CLINICAL DATA:  Status post left hip replacement EXAM: DG HIP (WITH OR WITHOUT PELVIS) 1V PORT LEFT COMPARISON:  12/04/2014 FINDINGS: Left hip prosthesis is noted in satisfactory position. No acute bony or soft tissue abnormality is noted. IMPRESSION: Status post left hip prosthesis Electronically Signed   By: Inez Catalina M.D.   On:  01/25/2016 10:57   Disposition: Plan will be for discharge home today pending potassium re-draw.  Follow-up Information    Judson Roch, PA-C Follow up in 14 day(s).   Specialty:  Physician Assistant Why:  Electa Sniff information: [place] [id number] [facility] [place] Follow up on 01/31/2016.   Why:  Appointment with Chelsea Aus at 2:00 pm.         Signed: Judson Roch PA-C 01/27/2016, 10:34 AM

## 2016-01-27 NOTE — Progress Notes (Signed)
Physical Therapy Treatment Patient Details Name: Raymond [name] MRN: [id number] DOB: 19-Mar-1955 Today's Date: 01/27/2016    History of Present Illness 60 y/o status post L THR, posterior approach (01/25/16).    PT Comments    Pt shows good effort t/o PT session and generally showed safety and awareness with mobility, etc.  He did have some pain/stiffness which is expected but could do AROM SLRs, maintained constant forward motion with ambulation and was able to rise from low surface w/o direct physical assist. Pt eager to go home and should be able to do so safely this afternoon.  Follow Up Recommendations  Home health PT     Equipment Recommendations       Recommendations for Other Services       Precautions / Restrictions Precautions Precautions: Posterior Hip;Fall Restrictions Weight Bearing Restrictions: Yes LLE Weight Bearing: Weight bearing as tolerated    Mobility  Bed Mobility Overal bed mobility: Needs Assistance Bed Mobility: Supine to Sit     Supine to sit: Min assist        Transfers Overall transfer level: Modified independent Equipment used: Rolling walker (2 wheeled) Transfers: Sit to/from Stand Sit to Stand: Min guard         General transfer comment: Pt needing a lot of cuing for set up and sequencing, but ultimately was able to rise w/o assist from low surface w/o railed arm rest, etc to push from.    Ambulation/Gait Ambulation/Gait assistance: Min guard Ambulation Distance (Feet): 200 Feet Assistive device: Rolling walker (2 wheeled)       General Gait Details: Pt with mild, expected limp on L, but able to maintain consistent cadence with constant walker movement and though he had some minor fatigue with the effort he ultimately was safe and did well.  Pt shows good effort t/o the bout of ambulaiton.    Stairs            Wheelchair Mobility    Modified Rankin (Stroke Patients Only)       Balance                                     Cognition Arousal/Alertness: Awake/alert Behavior During Therapy: WFL for tasks assessed/performed Overall Cognitive Status: Within Functional Limits for tasks assessed                      Exercises Total Joint Exercises Ankle Circles/Pumps: AROM;10 reps;Both Quad Sets: Strengthening;10 reps Gluteal Sets: Strengthening;10 reps Heel Slides: Strengthening;10 reps Hip ABduction/ADduction: Strengthening;10 reps Straight Leg Raises: AROM;10 reps    General Comments        Pertinent Vitals/Pain Pain Assessment: 0-10 Pain Score: 3  Pain Location: L hip Pain Descriptors / Indicators: Sore    Home Living                      Prior Function            PT Goals (current goals can now be found in the care plan section) Progress towards PT goals: Progressing toward goals    Frequency    BID      PT Plan Current plan remains appropriate    Co-evaluation             End of Session Equipment Utilized During Treatment: Gait belt Activity Tolerance: Patient tolerated treatment well Patient left: with chair alarm  set;with call bell/phone within reach     Time: 0911-0937 PT Time Calculation (min) (ACUTE ONLY): 26 min  Charges:  $Gait Training: 8-22 mins $Therapeutic Exercise: 8-22 mins                    G Codes:      Kreg Shropshire, DPT 01/27/2016, 10:46 AM

## 2016-01-27 NOTE — Progress Notes (Signed)
Subjective: 2 Days Post-Op Procedure(s) (LRB): TOTAL HIP ARTHROPLASTY (Left) Patient reports pain as mild.   Patient is well, and has had no acute complaints or problems Plan is to go Home after hospital stay. Negative for chest pain and shortness of breath Fever: no Gastrointestinal:Negative for nausea and vomiting  Objective: Vital signs in last 24 hours: Temp:  [98.3 F (36.8 C)-99.1 F (37.3 C)] 98.3 F (36.8 C) (12/07 0747) Pulse Rate:  [84-88] 84 (12/07 0747) Resp:  [18] 18 (12/07 0747) BP: (138-166)/(64-73) 139/64 (12/07 0747) SpO2:  [91 %-95 %] 95 % (12/07 0747)  Intake/Output from previous day:  Intake/Output Summary (Last 24 hours) at 01/27/16 1017 Last data filed at 01/27/16 0900  Gross per 24 hour  Intake             1080 ml  Output             1400 ml  Net             -320 ml    Intake/Output this shift: Total I/O In: 120 [P.O.:120] Out: -   Labs:  Recent Labs  01/26/16 0316  HGB 13.1    Recent Labs  01/26/16 0316  WBC 10.4  RBC 4.41  HCT 37.1*  PLT 179    Recent Labs  01/26/16 0316 01/27/16 0403  NA 138 135  K 3.4* 3.1*  CL 99* 95*  CO2 33* 31  BUN 11 13  CREATININE 0.76 0.78  GLUCOSE 177* 162*  CALCIUM 8.5* 8.7*   No results for input(s): LABPT, INR in the last 72 hours.  EXAM General - Patient is Alert, Appropriate and Oriented Extremity - Sensation intact distally Intact pulses distally Dorsiflexion/Plantar flexion intact Incision: scant drainage No cellulitis present Dressing/Incision - Scant bloody drainage to the left hip. Motor Function - intact, moving foot and toes well on exam.   Abdomen is soft on exam, no tympany.  Normal BS.  Past Medical History:  Diagnosis Date  . Allergy   . Arthritis   . Asthma    mild  . Carpal tunnel syndrome    bilateral  . Depression   . Diabetes mellitus without complication ([place])    Type II  . GERD (gastroesophageal reflux disease)   . Hyperlipidemia   . Hypertension    neg stress test 10 yrs ago - Dr Humphrey Rolls  . Sleep apnea    has BiPAP -doesn't use  . Varicose vein    treated by Dr. Delana Meyer    Assessment/Plan: 2 Days Post-Op Procedure(s) (LRB): TOTAL HIP ARTHROPLASTY (Left) Active Problems:   Status post total hip replacement, left  Estimated body mass index is 36.62 kg/m as calculated from the following:   Height as of this encounter: 6' (1.829 m).   Weight as of this encounter: 122.5 kg (270 lb). Advance diet Up with therapy D/C IV fluids when tolerating po intake.  Labs reviewed, hypokalemia (3.1) will supplement and re-check prior to discharge. No elevated WBC. Up with therapy today. Still needs to have a BM, can move on to suppositories today, patient is passing gas and stomach is not distended or tympanic. Plan will be for discharge home today pending potassium.  DVT Prophylaxis - Lovenox, Foot Pumps and TED hose Weight-Bearing as tolerated to left leg  J. Cameron Proud, PA-C [facility] Orthopaedic Surgery 01/27/2016, 10:17 AM

## 2016-01-27 NOTE — Anesthesia Postprocedure Evaluation (Signed)
Anesthesia Post Note  Patient: Raymond [name]  Procedure(s) Performed: Procedure(s) (LRB): TOTAL HIP ARTHROPLASTY (Left)  Patient location during evaluation: PACU Anesthesia Type: General Level of consciousness: awake and alert and oriented Pain management: pain level controlled Vital Signs Assessment: post-procedure vital signs reviewed and stable Respiratory status: spontaneous breathing Cardiovascular status: blood pressure returned to baseline Anesthetic complications: no    Last Vitals:  Vitals:   01/27/16 1035 01/27/16 1357  BP: 124/69 (!) 157/76  Pulse:  84  Resp:  18  Temp:  36.4 C    Last Pain:  Vitals:   01/27/16 1357  TempSrc: Oral  PainSc:                  Maurice Fotheringham

## 2016-01-28 ENCOUNTER — Other Ambulatory Visit: Payer: Self-pay | Admitting: *Deleted

## 2016-01-28 NOTE — Patient Outreach (Addendum)
[place] [facility]) Care Management  01/28/2016  Raymond [name] June 11, 1955 [id number]  Subjective: Telephone call to patient's home number, no answer, message states to enter remote access code, and unable to leave a message.   Objective: Per chart review: Patient hospitalized 01/25/16 - 01/27/16 for Degenerative joint disease, right hip and PRIMARY OSTEOARTHRITIS LEFT HIP.    Status post left total hip arthroplasty on 01/25/16.   Patient also has a history of GERD, hyperlipidemia, and hypertension.     Assessment: Received [employer] Transition of care referral on 01/27/16.   Referral source:  Wilhemina Cash RNCM at [facility].   Transition of care follow up pending patient outreach.    Plan: RNCM will send case status update to [place] [facility] [place] [facility]. RNCM will call patient for 2nd telephone outreach attempt, within 10 business days, if no return call.      Raymond [name], BSN, [place] [facility] Telephonic CM Phone: [phone] Fax: [phone]

## 2016-01-31 ENCOUNTER — Ambulatory Visit: Payer: Self-pay | Admitting: *Deleted

## 2016-02-01 ENCOUNTER — Other Ambulatory Visit: Payer: Self-pay | Admitting: *Deleted

## 2016-02-01 NOTE — Patient Outreach (Addendum)
[place] [facility]) Care Management  02/01/2016  SARAH PURK August 03, 1955 [id number]   Subjective: Telephone to patient's home number, no answer, and unable to leave a message.    Objective: Per chart review: Patient hospitalized 01/25/16 - 01/27/16 for Degenerative joint disease, right hip and PRIMARY OSTEOARTHRITIS LEFT HIP.    Status post left total hip arthroplasty on 01/25/16.   Patient also has a history of GERD, hyperlipidemia, and hypertension.     Assessment: Received [employer] Transition of care referral on 01/27/16.   Referral source:  Wilhemina Cash RNCM at [facility].   Transition of care follow up pending patient outreach.    Plan: RNCM will call patient for 3rd telephone outreach attempt, transition of care follow up, within 10 business days, if no return call.     Devere Brem H. Annia Friendly, BSN, [place] [facility] Telephonic CM Phone: [phone] Fax: [phone]

## 2016-02-02 ENCOUNTER — Other Ambulatory Visit: Payer: Self-pay | Admitting: *Deleted

## 2016-02-02 NOTE — Patient Outreach (Addendum)
[facility]) Care Management  02/02/2016  REVIS BOTZ 1955/09/22 [id number]   Subjective:  Telephone call to patient's home number, spoke with patient, and HIPAA verified.   Patient gave verbal authorization to [facility] to speak with wife Nunzio Cory regarding healthcare needs as needed.   Discussed [facility] Care Management  [employer] Transition of care follow up with patient and wife, both voice understanding, and are in agreement to complete follow up. Spoke with wife, states patient is doing well, attending outpatient physical therapy, and making progress.  Had the following durable medical equipment: rolling walker, bedside commode, chair lift, and built in shower chairs in all the bathrooms.    Wife states she and patient are very unhappy with several parts of his care while in the hospital and with the discharge process.  States she will express her concerns in writing when she receives the patient satisfaction survey.   States the discharge instructions did not include Lovenox on his list of medications.   Instructions did not advise patient to stop taking aspirin and fish oil.   States she was aware to stop aspirin and fish oil based on previous instructions, medical knowledge, and felt it should have been included in discharge instruction.   RNCM completed med review with wife, updated list of medications in chart, encouraged wife to follow up with patient's primary MD regarding discharge medications and to veify patient's current medication regimen.  Wife voiced understanding, states she will call primary MD's office to set up hospital follow up appointment and seek direction on medication regimen via phone if needed prior to appointment.  States MD is aware of patient's hospitalization but has not seen the patient for hospital follow up visit.    RNCM educated wife on importance of hospital follow up appointment with primary MD, voices understanding, and states she will follow up. Patient  has a follow up appointment with surgeon on 02/07/16 and will continue to receive outpatient physical therapy.   States patient did not receive an occupational therapy evaluation while in the hospital but outpatient physical therapist educated on proper body mechanics and positioning.  Patient's wife states patient does not have any transition of care, care coordination, disease management, disease monitoring, transportation, community resource, or pharmacy needs at this time.   Wife is very appreciative of the follow up call and is in agreement for patient to receive [facility] Care Management information.  Objective: Per chart review: Patient hospitalized 01/25/16 - 01/27/16 for Degenerative joint disease, right hip and PRIMARY OSTEOARTHRITIS LEFT HIP.  Status post lefttotal hip arthroplasty on 01/25/16. Patient also has a history of GERD, hyperlipidemia, and hypertension.    Assessment: Received [employer] Transition of care referral on 01/27/16. Referral source: Wilhemina Cash RNCM at [facility]. Transition of care follow up completed, no care management needs, and will proceed with case closure.    Plan:RNCM will send patient successful outreach letter, [facility] pamphlet, and magnet. RNCM will send case closure due to follow up completed / no care management needs request to [facility].    Montey Ebel H. Annia Friendly, BSN, [facility] Telephonic CM Phone: [phone] Fax: [phone]

## 2016-02-03 ENCOUNTER — Encounter: Payer: Self-pay | Admitting: *Deleted

## 2016-02-16 ENCOUNTER — Ambulatory Visit (INDEPENDENT_AMBULATORY_CARE_PROVIDER_SITE_OTHER): Payer: Managed Care, Other (non HMO) | Admitting: Family Medicine

## 2016-02-16 ENCOUNTER — Encounter: Payer: Self-pay | Admitting: Family Medicine

## 2016-02-16 VITALS — BP 102/70 | HR 68 | Ht 73.0 in | Wt 271.0 lb

## 2016-02-16 DIAGNOSIS — E860 Dehydration: Secondary | ICD-10-CM

## 2016-02-16 DIAGNOSIS — I10 Essential (primary) hypertension: Secondary | ICD-10-CM | POA: Diagnosis not present

## 2016-02-16 DIAGNOSIS — N39490 Overflow incontinence: Secondary | ICD-10-CM | POA: Diagnosis not present

## 2016-02-16 DIAGNOSIS — R822 Biliuria: Secondary | ICD-10-CM | POA: Diagnosis not present

## 2016-02-16 DIAGNOSIS — N411 Chronic prostatitis: Secondary | ICD-10-CM | POA: Diagnosis not present

## 2016-02-16 LAB — POCT URINALYSIS DIPSTICK
Bilirubin, UA: POSITIVE
Glucose, UA: NEGATIVE
Ketones, UA: NEGATIVE
NITRITE UA: POSITIVE
Spec Grav, UA: 1.02
UROBILINOGEN UA: 4
pH, UA: 6

## 2016-02-16 MED ORDER — SULFAMETHOXAZOLE-TRIMETHOPRIM 800-160 MG PO TABS: 1.0000 | ORAL_TABLET | Freq: Two times a day (BID) | ORAL | 0 refills | Status: DC

## 2016-02-16 NOTE — Progress Notes (Signed)
Name: Raymond [name]   MRN: [id number]    DOB: 06/13/1955   Date:02/16/2016       Progress Note  Subjective  Chief Complaint  Chief Complaint  Patient presents with  . Urinary Tract Infection    pressure, burning sensation, urine "smells horrible"    Urinary Tract Infection   This is a new problem. The current episode started 1 to 4 weeks ago. The problem occurs intermittently. The problem has been waxing and waning. The quality of the pain is described as burning. The pain is moderate. There has been no fever. Associated symptoms include chills, frequency, urgency and vomiting. Pertinent negatives include no discharge, flank pain, hematuria, hesitancy, nausea or sweats. He has tried nothing for the symptoms. His past medical history is significant for catheterization. There is no history of kidney stones, recurrent UTIs, a single kidney, urinary stasis or a urological procedure.    No problem-specific Assessment & Plan notes found for this encounter.   Past Medical History:  Diagnosis Date  . Allergy   . Arthritis   . Asthma    mild  . Carpal tunnel syndrome    bilateral  . Depression   . Diabetes mellitus without complication ([place])    Type II  . GERD (gastroesophageal reflux disease)   . Hyperlipidemia   . Hypertension    neg stress test 10 yrs ago - Dr Raymond [name]  . Sleep apnea    has BiPAP -doesn't use  . Varicose vein    treated by Dr. Delana [name]    Past Surgical History:  Procedure Laterality Date  . CHOLECYSTECTOMY    . CHONDROPLASTY  10/05/09   knee- shaving patellofemoral joint  . COLONOSCOPY  2009?  . ENDOVENOUS ABLATION SAPHENOUS VEIN W/ LASER    . JOINT REPLACEMENT    . KNEE ARTHROSCOPY WITH MEDIAL MENISECTOMY Right    Partial medial and lateral menisectomy  . SHOULDER ARTHROSCOPY Left 08/10/2014   Procedure: ARTHROSCOPY SHOULDER WITH LIMITED DEBRIDEMENT, REMOVAL OF LOOSE BODY AND RELEASE OF LONG END BICEPS TENDON;  Surgeon: Raymond Kail, MD;  Location: [place];  Service: Orthopedics;  Laterality: Left;  . TOTAL HIP ARTHROPLASTY Left 01/25/2016   Procedure: TOTAL HIP ARTHROPLASTY;  Surgeon: Raymond Mull, MD;  Location: [facility];  Service: Orthopedics;  Laterality: Left;    History reviewed. No pertinent family history.  Social History   Social History  . Marital status: Married    Spouse name: N/A  . Number of children: N/A  . Years of education: N/A   Occupational History  . Not on file.   Social History Main Topics  . Smoking status: Never Smoker  . Smokeless tobacco: Never Used  . Alcohol use Yes     Comment: rare  . Drug use: No  . Sexual activity: Yes   Other Topics Concern  . Not on file   Social History Narrative  . No narrative on file    No Known Allergies   Review of Systems  Constitutional: Positive for chills. Negative for fever, malaise/fatigue and weight loss.  HENT: Negative for ear discharge, ear pain and sore throat.   Eyes: Negative for blurred vision.  Respiratory: Negative for cough, sputum production, shortness of breath and wheezing.   Cardiovascular: Negative for chest pain, palpitations and leg swelling.  Gastrointestinal: Positive for vomiting. Negative for abdominal pain, blood in stool, constipation, diarrhea, heartburn, melena and nausea.  Genitourinary: Positive for frequency and urgency. Negative for dysuria, flank pain, hematuria  and hesitancy.  Musculoskeletal: Negative for back pain, joint pain, myalgias and neck pain.  Skin: Negative for rash.  Neurological: Negative for dizziness, tingling, sensory change, focal weakness and headaches.  Endo/Heme/Allergies: Negative for environmental allergies and polydipsia. Does not bruise/bleed easily.  Psychiatric/Behavioral: Negative for depression and suicidal ideas. The patient is not nervous/anxious and does not have insomnia.      Objective  Vitals:   02/16/16 1044  BP: 102/70  Pulse: 68  Weight: 271 lb (122.9 kg)  Height: 6'  1" (1.854 m)    Physical Exam  Constitutional: He is oriented to person, place, and time and well-developed, well-nourished, and in no distress.  HENT:  Head: Normocephalic.  Right Ear: External ear normal.  Left Ear: External ear normal.  Nose: Nose normal.  Mouth/Throat: Oropharynx is clear and moist.  Eyes: Conjunctivae and EOM are normal. Pupils are equal, round, and reactive to light. Right eye exhibits no discharge. Left eye exhibits no discharge. No scleral icterus.  Neck: Normal range of motion. Neck supple. No JVD present. No tracheal deviation present. No thyromegaly present.  Cardiovascular: Normal rate, regular rhythm, normal heart sounds and intact distal pulses.  Exam reveals no gallop and no friction rub.   No murmur heard. Pulmonary/Chest: Breath sounds normal. No respiratory distress. He has no wheezes. He has no rales.  Abdominal: Soft. Bowel sounds are normal. He exhibits no mass. There is no hepatosplenomegaly. There is no tenderness. There is no rebound, no guarding and no CVA tenderness.  Genitourinary: Rectum normal and prostate normal.  Musculoskeletal: Normal range of motion. He exhibits no edema or tenderness.  Lymphadenopathy:    He has no cervical adenopathy.  Neurological: He is alert and oriented to person, place, and time. He has normal sensation, normal strength and intact cranial nerves. No cranial nerve deficit.  Skin: Skin is warm. No rash noted.  Psychiatric: Mood and affect normal.  Nursing note and vitals reviewed.     Assessment & Plan  Problem List Items Addressed This Visit      Cardiovascular and Mediastinum   Essential hypertension   Relevant Orders   Renal Function Panel    Other Visit Diagnoses    Subacute prostatitis    -  Primary   Relevant Medications   sulfamethoxazole-trimethoprim (BACTRIM DS,SEPTRA DS) 800-160 MG tablet   Other Relevant Orders   Ambulatory referral to Urology   PSA   POCT urinalysis dipstick (Completed)    Overflow incontinence       Relevant Orders   Ambulatory referral to Urology   PSA   POCT urinalysis dipstick (Completed)   Bilirubinuria       Relevant Orders   Hepatic function panel   Dehydration       Relevant Orders   Renal Function Panel        Dr. Otilio Miu [facility]  02/16/16

## 2016-02-17 ENCOUNTER — Ambulatory Visit: Payer: Managed Care, Other (non HMO) | Admitting: Family Medicine

## 2016-02-17 LAB — RENAL FUNCTION PANEL
Albumin: 4 g/dL (ref 3.6–4.8)
BUN / CREAT RATIO: 16 (ref 10–24)
BUN: 15 mg/dL (ref 8–27)
CALCIUM: 9.3 mg/dL (ref 8.6–10.2)
CO2: 31 mmol/L — ABNORMAL HIGH (ref 18–29)
CREATININE: 0.93 mg/dL (ref 0.76–1.27)
Chloride: 90 mmol/L — ABNORMAL LOW (ref 96–106)
GFR calc Af Amer: 102 mL/min/{1.73_m2} (ref >59)
GFR, EST NON AFRICAN AMERICAN: 88 mL/min/{1.73_m2} (ref >59)
Glucose: 170 mg/dL — ABNORMAL HIGH (ref 65–99)
Phosphorus: 3.1 mg/dL (ref 2.5–4.5)
Potassium: 3.4 mmol/L — ABNORMAL LOW (ref 3.5–5.2)
Sodium: 139 mmol/L (ref 134–144)

## 2016-02-17 LAB — HEPATIC FUNCTION PANEL
ALK PHOS: 108 IU/L (ref 39–117)
ALT: 19 IU/L (ref 0–44)
AST: 19 IU/L (ref 0–40)
Bilirubin Total: 0.9 mg/dL (ref 0.0–1.2)
Bilirubin, Direct: 0.3 mg/dL (ref 0.00–0.40)
TOTAL PROTEIN: 7.8 g/dL (ref 6.0–8.5)

## 2016-02-17 LAB — PSA: Prostate Specific Ag, Serum: 15.3 ng/mL — ABNORMAL HIGH (ref 0.0–4.0)

## 2016-03-10 ENCOUNTER — Ambulatory Visit: Payer: Managed Care, Other (non HMO) | Admitting: Urology

## 2016-03-21 ENCOUNTER — Other Ambulatory Visit: Payer: Self-pay | Admitting: Family Medicine

## 2016-03-21 DIAGNOSIS — E785 Hyperlipidemia, unspecified: Secondary | ICD-10-CM

## 2016-03-21 DIAGNOSIS — K219 Gastro-esophageal reflux disease without esophagitis: Secondary | ICD-10-CM

## 2016-03-21 DIAGNOSIS — G6189 Other inflammatory polyneuropathies: Secondary | ICD-10-CM

## 2016-03-21 DIAGNOSIS — E119 Type 2 diabetes mellitus without complications: Secondary | ICD-10-CM

## 2016-03-21 DIAGNOSIS — F32A Depression, unspecified: Secondary | ICD-10-CM

## 2016-03-21 DIAGNOSIS — I10 Essential (primary) hypertension: Secondary | ICD-10-CM

## 2016-03-21 DIAGNOSIS — F329 Major depressive disorder, single episode, unspecified: Secondary | ICD-10-CM

## 2016-05-31 ENCOUNTER — Other Ambulatory Visit: Payer: Self-pay | Admitting: Family Medicine

## 2016-05-31 DIAGNOSIS — G6189 Other inflammatory polyneuropathies: Secondary | ICD-10-CM

## 2016-05-31 DIAGNOSIS — F32A Depression, unspecified: Secondary | ICD-10-CM

## 2016-05-31 DIAGNOSIS — I10 Essential (primary) hypertension: Secondary | ICD-10-CM

## 2016-05-31 DIAGNOSIS — E785 Hyperlipidemia, unspecified: Secondary | ICD-10-CM

## 2016-05-31 DIAGNOSIS — F329 Major depressive disorder, single episode, unspecified: Secondary | ICD-10-CM

## 2016-07-24 ENCOUNTER — Ambulatory Visit (INDEPENDENT_AMBULATORY_CARE_PROVIDER_SITE_OTHER): Payer: Managed Care, Other (non HMO) | Admitting: Family Medicine

## 2016-07-24 ENCOUNTER — Encounter: Payer: Self-pay | Admitting: Family Medicine

## 2016-07-24 VITALS — BP 120/64 | HR 64 | Ht 73.0 in | Wt 290.0 lb

## 2016-07-24 DIAGNOSIS — K219 Gastro-esophageal reflux disease without esophagitis: Secondary | ICD-10-CM | POA: Diagnosis not present

## 2016-07-24 DIAGNOSIS — E782 Mixed hyperlipidemia: Secondary | ICD-10-CM | POA: Diagnosis not present

## 2016-07-24 DIAGNOSIS — L309 Dermatitis, unspecified: Secondary | ICD-10-CM | POA: Diagnosis not present

## 2016-07-24 DIAGNOSIS — R079 Chest pain, unspecified: Secondary | ICD-10-CM | POA: Diagnosis not present

## 2016-07-24 DIAGNOSIS — R351 Nocturia: Secondary | ICD-10-CM

## 2016-07-24 DIAGNOSIS — I10 Essential (primary) hypertension: Secondary | ICD-10-CM | POA: Diagnosis not present

## 2016-07-24 DIAGNOSIS — E1165 Type 2 diabetes mellitus with hyperglycemia: Secondary | ICD-10-CM

## 2016-07-24 DIAGNOSIS — E669 Obesity, unspecified: Secondary | ICD-10-CM | POA: Diagnosis not present

## 2016-07-24 DIAGNOSIS — F3341 Major depressive disorder, recurrent, in partial remission: Secondary | ICD-10-CM | POA: Diagnosis not present

## 2016-07-24 DIAGNOSIS — E119 Type 2 diabetes mellitus without complications: Secondary | ICD-10-CM | POA: Diagnosis not present

## 2016-07-24 DIAGNOSIS — H6501 Acute serous otitis media, right ear: Secondary | ICD-10-CM

## 2016-07-24 DIAGNOSIS — IMO0001 Reserved for inherently not codable concepts without codable children: Secondary | ICD-10-CM | POA: Insufficient documentation

## 2016-07-24 DIAGNOSIS — G6189 Other inflammatory polyneuropathies: Secondary | ICD-10-CM | POA: Diagnosis not present

## 2016-07-24 DIAGNOSIS — Z0001 Encounter for general adult medical examination with abnormal findings: Secondary | ICD-10-CM | POA: Diagnosis not present

## 2016-07-24 DIAGNOSIS — Z0000 Encounter for general adult medical examination without abnormal findings: Secondary | ICD-10-CM

## 2016-07-24 MED ORDER — RANITIDINE HCL 300 MG PO CAPS: 300.0000 mg | ORAL_CAPSULE | Freq: Every evening | ORAL | 1 refills | Status: DC

## 2016-07-24 MED ORDER — GABAPENTIN 300 MG PO CAPS: 300.0000 mg | ORAL_CAPSULE | Freq: Three times a day (TID) | ORAL | 1 refills | Status: DC

## 2016-07-24 MED ORDER — MECLIZINE HCL 25 MG PO TABS: 25.0000 mg | ORAL_TABLET | Freq: Three times a day (TID) | ORAL | 0 refills | Status: DC | PRN

## 2016-07-24 MED ORDER — TRIAMCINOLONE ACETONIDE 0.1 % EX CREA: 1.0000 "application " | TOPICAL_CREAM | Freq: Two times a day (BID) | CUTANEOUS | 0 refills | Status: DC

## 2016-07-24 MED ORDER — OMEGA-3 FISH OIL 1000 MG PO CAPS: 1.0000 | ORAL_CAPSULE | Freq: Every day | ORAL | 4 refills | Status: DC

## 2016-07-24 MED ORDER — ASPIRIN 81 MG PO TABS: 81.0000 mg | ORAL_TABLET | Freq: Every day | ORAL | 11 refills | Status: DC

## 2016-07-24 MED ORDER — HYDROCHLOROTHIAZIDE 25 MG PO TABS: ORAL_TABLET | ORAL | 1 refills | Status: DC

## 2016-07-24 MED ORDER — ESCITALOPRAM OXALATE 20 MG PO TABS: 20.0000 mg | ORAL_TABLET | Freq: Every day | ORAL | 1 refills | Status: DC

## 2016-07-24 MED ORDER — METFORMIN HCL 500 MG PO TABS: ORAL_TABLET | ORAL | 1 refills | Status: DC

## 2016-07-24 MED ORDER — LOSARTAN POTASSIUM 50 MG PO TABS: 50.0000 mg | ORAL_TABLET | Freq: Every day | ORAL | 1 refills | Status: DC

## 2016-07-24 MED ORDER — AMLODIPINE BESYLATE 10 MG PO TABS: ORAL_TABLET | ORAL | 1 refills | Status: DC

## 2016-07-24 MED ORDER — BUPROPION HCL 75 MG PO TABS: 75.0000 mg | ORAL_TABLET | Freq: Every day | ORAL | 1 refills | Status: DC

## 2016-07-24 NOTE — Progress Notes (Signed)
Name: Raymond [name]   MRN: [id number]    DOB: 09/22/1955   Date:07/24/2016       Progress Note  Subjective  Chief Complaint  Chief Complaint  Patient presents with  . Annual Exam  . Diabetes  . Gastroesophageal Reflux  . Hyperlipidemia  . Hypertension  . Dizziness    Diabetes  He presents for his follow-up diabetic visit. He has type 2 diabetes mellitus. His disease course has been stable. Hypoglycemia symptoms include dizziness. Pertinent negatives for hypoglycemia include no confusion, headaches, hunger, mood changes, nervousness/anxiousness, pallor, seizures, sleepiness, speech difficulty, sweats or tremors. Pertinent negatives for diabetes include no blurred vision, no chest pain, no fatigue, no foot paresthesias, no foot ulcerations, no polydipsia, no polyphagia, no polyuria, no visual change, no weakness and no weight loss. There are no hypoglycemic complications. Symptoms are stable. There are no diabetic complications. Pertinent negatives for diabetic complications include no CVA, PVD or retinopathy. He is following a generally healthy diet. Home blood sugar record trend: "i don't know" An ACE inhibitor/angiotensin II receptor blocker is being taken. He does not see a podiatrist.Eye exam is not current.  Gastroesophageal Reflux  He reports no abdominal pain, no belching, no chest pain, no coughing, no dysphagia, no early satiety, no heartburn, no nausea, no sore throat or no wheezing. This is a chronic problem. The problem has been unchanged. The symptoms are aggravated by certain foods. Pertinent negatives include no fatigue, melena or weight loss. Risk factors include obesity. He has tried a PPI for the symptoms. The treatment provided moderate relief.  Hyperlipidemia  This is a chronic problem. The problem is uncontrolled. Recent lipid tests were reviewed and are variable. Exacerbating diseases include diabetes. He has no history of chronic renal disease. Pertinent negatives include no  chest pain, focal weakness, myalgias or shortness of breath. The current treatment provides moderate improvement of lipids. Compliance problems include medication cost.  Risk factors for coronary artery disease include diabetes mellitus, dyslipidemia, hypertension, male sex and obesity.  Hypertension  This is a chronic problem. The problem is unchanged. The problem is controlled. Pertinent negatives include no blurred vision, chest pain, headaches, malaise/fatigue, neck pain, palpitations, shortness of breath or sweats. Past treatments include diuretics, calcium channel blockers and angiotensin blockers. The current treatment provides mild improvement. There are no compliance problems.  There is no history of angina, kidney disease, CAD/MI, CVA, heart failure, left ventricular hypertrophy, PVD or retinopathy. There is no history of chronic renal disease, a hypertension causing med or renovascular disease.  Dizziness  This is a new problem. The problem occurs intermittently. The problem has been waxing and waning. Associated symptoms include a rash. Pertinent negatives include no abdominal pain, chest pain, chills, coughing, fatigue, fever, headaches, myalgias, nausea, neck pain, sore throat, visual change or weakness. Exacerbated by: pressure left ear. The treatment provided mild relief.  Rash  The current episode started more than 1 month ago. The problem has been waxing and waning since onset. The affected locations include the left hand and right hand. The rash is characterized by redness and itchiness. He was exposed to nothing. Pertinent negatives include no cough, diarrhea, fatigue, fever, joint pain, shortness of breath or sore throat. Past treatments include nothing.    No problem-specific Assessment & Plan notes found for this encounter.   Past Medical History:  Diagnosis Date  . Allergy   . Arthritis   . Asthma    mild  . Carpal tunnel syndrome    bilateral  .  Depression   . Diabetes  mellitus without complication ([place])    Type II  . GERD (gastroesophageal reflux disease)   . Hyperlipidemia   . Hypertension    neg stress test 10 yrs ago - Dr Humphrey Rolls  . Sleep apnea    has BiPAP -doesn't use  . Varicose vein    treated by Dr. Delana Meyer    Past Surgical History:  Procedure Laterality Date  . CHOLECYSTECTOMY    . CHONDROPLASTY  10/05/09   knee- shaving patellofemoral joint  . COLONOSCOPY  2009?  . ENDOVENOUS ABLATION SAPHENOUS VEIN W/ LASER    . JOINT REPLACEMENT    . KNEE ARTHROSCOPY WITH MEDIAL MENISECTOMY Right    Partial medial and lateral menisectomy  . SHOULDER ARTHROSCOPY Left 08/10/2014   Procedure: ARTHROSCOPY SHOULDER WITH LIMITED DEBRIDEMENT, REMOVAL OF LOOSE BODY AND RELEASE OF LONG END BICEPS TENDON;  Surgeon: Leanor Kail, MD;  Location: [place];  Service: Orthopedics;  Laterality: Left;  . TOTAL HIP ARTHROPLASTY Left 01/25/2016   Procedure: TOTAL HIP ARTHROPLASTY;  Surgeon: Corky Mull, MD;  Location: [facility];  Service: Orthopedics;  Laterality: Left;    No family history on file.  Social History   Social History  . Marital status: Married    Spouse name: N/A  . Number of children: N/A  . Years of education: N/A   Occupational History  . Not on file.   Social History Main Topics  . Smoking status: Never Smoker  . Smokeless tobacco: Never Used  . Alcohol use Yes     Comment: rare  . Drug use: No  . Sexual activity: Yes   Other Topics Concern  . Not on file   Social History Narrative  . No narrative on file    No Known Allergies  Outpatient Medications Prior to Visit  Medication Sig Dispense Refill  . ACCU-CHEK AVIVA PLUS test strip TEST AS DIRECTED 50 each 0  . Cholecalciferol 1000 UNITS capsule Take 1,000 Units by mouth daily. AM    . cyanocobalamin 1000 MCG tablet Take 1,000 mcg by mouth daily. AM     . fluticasone (FLONASE) 50 MCG/ACT nasal spray Place 2 sprays into both nostrils daily. AM 1 g 11  .  glucosamine-chondroitin 500-400 MG tablet Take 1 tablet by mouth daily.    . meloxicam (MOBIC) 7.5 MG tablet Take 1 tablet (7.5 mg total) by mouth 2 (two) times daily. Marylee Floras 60 tablet 5  . simvastatin (ZOCOR) 40 MG tablet TAKE 1 TABLET BY MOUTH AT  BEDTIME 90 tablet 0  . amLODipine (NORVASC) 10 MG tablet TAKE 1 TABLET BY MOUTH  DAILY IN THE MORNING 90 tablet 0  . aspirin 81 MG tablet Take 81 mg by mouth daily. AM    . buPROPion (WELLBUTRIN) 75 MG tablet TAKE 1 TABLET BY MOUTH  DAILY 90 tablet 0  . escitalopram (LEXAPRO) 20 MG tablet TAKE 1 TABLET BY MOUTH  DAILY 90 tablet 0  . esomeprazole (NEXIUM) 40 MG capsule TAKE 1 CAPSULE BY MOUTH  DAILY AT 12 NOON 90 capsule 0  . gabapentin (NEURONTIN) 300 MG capsule TAKE 1 CAPSULE BY MOUTH 3  TIMES A DAY 270 capsule 0  . hydrochlorothiazide (HYDRODIURIL) 25 MG tablet TAKE 1 TABLET BY MOUTH  DAILY IN THE MORNING 90 tablet 0  . losartan (COZAAR) 50 MG tablet TAKE 1 TABLET BY MOUTH  DAILY 90 tablet 0  . omega-3 fish oil (MAXEPA) 1000 MG CAPS capsule Take 1 capsule by  mouth daily. AM     . dapagliflozin propanediol (FARXIGA) 5 MG TABS tablet Take 5 mg by mouth daily. (Patient not taking: Reported on 07/24/2016) 30 tablet 0  . cyclobenzaprine (FLEXERIL) 10 MG tablet TAKE 1 TABLET BY MOUTH TWICE A DAY AS NEEDED FOR MUSCLE SPASMS FOR UP TO 10 DAYS    . enoxaparin (LOVENOX) 40 MG/0.4ML injection Inject 40 mg into the skin.    . metFORMIN (GLUCOPHAGE) 500 MG tablet TAKE 1 TABLET BY MOUTH TWO  TIMES DAILY WITH MEALS (Patient not taking: Reported on 07/24/2016) 180 tablet 0  . oxyCODONE (OXY IR/ROXICODONE) 5 MG immediate release tablet Take 1-2 tablets (5-10 mg total) by mouth every 4 (four) hours as needed for breakthrough pain. 60 tablet 0  . sulfamethoxazole-trimethoprim (BACTRIM DS,SEPTRA DS) 800-160 MG tablet Take 1 tablet by mouth 2 (two) times daily. 42 tablet 0  . VICTOZA 18 MG/3ML SOPN INJECT 1.8 MG SUBCUTANEOUSLY EVERY DAY (Patient not taking: Reported on  07/24/2016) 5 pen 0   No facility-administered medications prior to visit.     Review of Systems  Constitutional: Negative for chills, fatigue, fever, malaise/fatigue and weight loss.  HENT: Negative for ear discharge, ear pain and sore throat.   Eyes: Negative for blurred vision.  Respiratory: Negative for cough, sputum production, shortness of breath and wheezing.   Cardiovascular: Negative for chest pain, palpitations and leg swelling.  Gastrointestinal: Negative for abdominal pain, blood in stool, constipation, diarrhea, dysphagia, heartburn, melena and nausea.  Genitourinary: Negative for dysuria, frequency, hematuria and urgency.  Musculoskeletal: Negative for back pain, joint pain, myalgias and neck pain.  Skin: Positive for itching and rash. Negative for pallor.  Neurological: Positive for dizziness. Negative for tingling, tremors, sensory change, focal weakness, seizures, speech difficulty, weakness and headaches.  Endo/Heme/Allergies: Negative for environmental allergies, polydipsia and polyphagia. Does not bruise/bleed easily.  Psychiatric/Behavioral: Negative for confusion, depression and suicidal ideas. The patient is not nervous/anxious and does not have insomnia.      Objective  Vitals:   07/24/16 0940  BP: 120/64  Pulse: 64  Weight: 290 lb (131.5 kg)  Height: 6\' 1"  (1.854 m)    Physical Exam  Constitutional: He is oriented to person, place, and time and well-developed, well-nourished, and in no distress.  HENT:  Head: Normocephalic.  Right Ear: External ear normal. A middle ear effusion is present.  Left Ear: External ear normal. Tympanic membrane is retracted.  Nose: Nose normal.  Mouth/Throat: Oropharynx is clear and moist.  Eyes: Conjunctivae and EOM are normal. Pupils are equal, round, and reactive to light. Right eye exhibits no discharge. Left eye exhibits no discharge. No scleral icterus.  Neck: Normal range of motion. Neck supple. No JVD present. No  tracheal deviation present. No thyromegaly present.  Cardiovascular: Normal rate, regular rhythm, normal heart sounds and intact distal pulses.  Exam reveals no gallop and no friction rub.   No murmur heard. Pulmonary/Chest: Breath sounds normal. No respiratory distress. He has no wheezes. He has no rales.  Abdominal: Soft. Bowel sounds are normal. He exhibits no mass. There is no hepatosplenomegaly. There is no tenderness. There is no rebound, no guarding and no CVA tenderness.  Genitourinary: Rectum normal and prostate normal. Rectal exam shows guaiac negative stool.  Musculoskeletal: Normal range of motion. He exhibits no edema or tenderness.  Lymphadenopathy:    He has no cervical adenopathy.  Neurological: He is alert and oriented to person, place, and time. He has normal sensation, normal strength, normal reflexes  and intact cranial nerves. No cranial nerve deficit.  Skin: Skin is warm. No rash noted.  Psychiatric: Mood and affect normal.  Nursing note and vitals reviewed.     Assessment & Plan  Problem List Items Addressed This Visit      Cardiovascular and Mediastinum   Essential hypertension   Relevant Medications   losartan (COZAAR) 50 MG tablet   amLODipine (NORVASC) 10 MG tablet   hydrochlorothiazide (HYDRODIURIL) 25 MG tablet   aspirin 81 MG tablet   Other Relevant Orders   Renal Function Panel     Digestive   GERD (gastroesophageal reflux disease)   Relevant Medications   ranitidine (ZANTAC) 300 MG capsule   meclizine (ANTIVERT) 25 MG tablet     Endocrine   Type 2 diabetes mellitus without complication, without long-term current use of insulin (HCC)   Relevant Medications   losartan (COZAAR) 50 MG tablet   metFORMIN (GLUCOPHAGE) 500 MG tablet   omega-3 fish oil (MAXEPA) 1000 MG CAPS capsule   aspirin 81 MG tablet   Other Relevant Orders   Hemoglobin A1c   Renal Function Panel   Ambulatory referral to Endocrinology     Other   Depression   Relevant  Medications   escitalopram (LEXAPRO) 20 MG tablet   buPROPion (WELLBUTRIN) 75 MG tablet   Hyperlipidemia   Relevant Medications   losartan (COZAAR) 50 MG tablet   amLODipine (NORVASC) 10 MG tablet   omega-3 fish oil (MAXEPA) 1000 MG CAPS capsule   hydrochlorothiazide (HYDRODIURIL) 25 MG tablet   aspirin 81 MG tablet   Other Relevant Orders   Lipid Profile   Chest pain   Relevant Orders   EKG 12-Lead (Completed)   Ambulatory referral to Cardiology    Other Visit Diagnoses    Annual physical exam    -  Primary   Dermatitis       Relevant Medications   triamcinolone cream (KENALOG) 0.1 %   Other inflammatory polyneuropathies (HCC)       Relevant Medications   escitalopram (LEXAPRO) 20 MG tablet   buPROPion (WELLBUTRIN) 75 MG tablet   gabapentin (NEURONTIN) 300 MG capsule   Obesity (BMI 30-39.9)       Relevant Medications   metFORMIN (GLUCOPHAGE) 500 MG tablet   Nocturia       Relevant Orders   PSA   Right acute serous otitis media, recurrence not specified       Relevant Medications   meclizine (ANTIVERT) 25 MG tablet      Meds ordered this encounter  Medications  . escitalopram (LEXAPRO) 20 MG tablet    Sig: Take 1 tablet (20 mg total) by mouth daily.    Dispense:  90 tablet    Refill:  1  . buPROPion (WELLBUTRIN) 75 MG tablet    Sig: Take 1 tablet (75 mg total) by mouth daily.    Dispense:  90 tablet    Refill:  1  . losartan (COZAAR) 50 MG tablet    Sig: Take 1 tablet (50 mg total) by mouth daily.    Dispense:  90 tablet    Refill:  1  . amLODipine (NORVASC) 10 MG tablet    Sig: TAKE 1 TABLET BY MOUTH  DAILY IN THE MORNING    Dispense:  90 tablet    Refill:  1  . gabapentin (NEURONTIN) 300 MG capsule    Sig: Take 1 capsule (300 mg total) by mouth 3 (three) times daily.    Dispense:  270  capsule    Refill:  1  . metFORMIN (GLUCOPHAGE) 500 MG tablet    Sig: TAKE 1 TABLET BY MOUTH TWO  TIMES DAILY WITH MEALS    Dispense:  180 tablet    Refill:  1  .  omega-3 fish oil (MAXEPA) 1000 MG CAPS capsule    Sig: Take 1 capsule (1,000 mg total) by mouth daily. AM    Dispense:  90 capsule    Refill:  4  . hydrochlorothiazide (HYDRODIURIL) 25 MG tablet    Sig: TAKE 1 TABLET BY MOUTH  DAILY IN THE MORNING    Dispense:  90 tablet    Refill:  1  . aspirin 81 MG tablet    Sig: Take 1 tablet (81 mg total) by mouth daily. AM    Dispense:  30 tablet    Refill:  11  . DISCONTD: triamcinolone cream (KENALOG) 0.1 %    Sig: Apply 1 application topically 2 (two) times daily.    Dispense:  30 g    Refill:  0  . ranitidine (ZANTAC) 300 MG capsule    Sig: Take 1 capsule (300 mg total) by mouth every evening.    Dispense:  90 capsule    Refill:  1  . triamcinolone cream (KENALOG) 0.1 %    Sig: Apply 1 application topically 2 (two) times daily. Please pull from [employer]    Dispense:  30 g    Refill:  0  . meclizine (ANTIVERT) 25 MG tablet    Sig: Take 1 tablet (25 mg total) by mouth 3 (three) times daily as needed for dizziness.    Dispense:  30 tablet    Refill:  0   I spent 45 minutes with this patient, More than 50% of that time was spent in face to face education, counseling and care coordination.   Dr. [facility]  07/24/16

## 2016-07-25 LAB — RENAL FUNCTION PANEL
ALBUMIN: 4.5 g/dL (ref 3.6–4.8)
BUN/Creatinine Ratio: 18 (ref 10–24)
BUN: 16 mg/dL (ref 8–27)
CALCIUM: 9.4 mg/dL (ref 8.6–10.2)
CO2: 25 mmol/L (ref 18–29)
CREATININE: 0.89 mg/dL (ref 0.76–1.27)
Chloride: 99 mmol/L (ref 96–106)
GFR calc Af Amer: 107 mL/min/{1.73_m2} (ref >59)
GFR calc non Af Amer: 92 mL/min/{1.73_m2} (ref >59)
Glucose: 203 mg/dL — ABNORMAL HIGH (ref 65–99)
PHOSPHORUS: 3.1 mg/dL (ref 2.5–4.5)
Potassium: 3.5 mmol/L (ref 3.5–5.2)
SODIUM: 140 mmol/L (ref 134–144)

## 2016-07-25 LAB — HEMOGLOBIN A1C
ESTIMATED AVERAGE GLUCOSE: 183 mg/dL
Hgb A1c MFr Bld: 8 % — ABNORMAL HIGH (ref 4.8–5.6)

## 2016-07-25 LAB — LIPID PANEL
CHOLESTEROL TOTAL: 149 mg/dL (ref 100–199)
Chol/HDL Ratio: 3.3 ratio (ref 0.0–5.0)
HDL: 45 mg/dL (ref >39)
LDL CALC: 85 mg/dL (ref 0–99)
TRIGLYCERIDES: 94 mg/dL (ref 0–149)
VLDL Cholesterol Cal: 19 mg/dL (ref 5–40)

## 2016-07-25 LAB — PSA: Prostate Specific Ag, Serum: 1.2 ng/mL (ref 0.0–4.0)

## 2016-09-05 ENCOUNTER — Other Ambulatory Visit: Payer: Self-pay

## 2016-11-24 ENCOUNTER — Other Ambulatory Visit: Payer: Self-pay | Admitting: Family Medicine

## 2017-02-19 ENCOUNTER — Encounter: Payer: Managed Care, Other (non HMO) | Attending: Internal Medicine | Admitting: Dietician

## 2017-02-19 ENCOUNTER — Encounter: Payer: Self-pay | Admitting: Dietician

## 2017-02-19 VITALS — Ht 72.0 in | Wt 280.5 lb

## 2017-02-19 DIAGNOSIS — F4310 Post-traumatic stress disorder, unspecified: Secondary | ICD-10-CM | POA: Insufficient documentation

## 2017-02-19 DIAGNOSIS — Z713 Dietary counseling and surveillance: Secondary | ICD-10-CM | POA: Diagnosis present

## 2017-02-19 DIAGNOSIS — E785 Hyperlipidemia, unspecified: Secondary | ICD-10-CM | POA: Insufficient documentation

## 2017-02-19 DIAGNOSIS — E669 Obesity, unspecified: Secondary | ICD-10-CM | POA: Insufficient documentation

## 2017-02-19 DIAGNOSIS — E119 Type 2 diabetes mellitus without complications: Secondary | ICD-10-CM | POA: Insufficient documentation

## 2017-02-19 DIAGNOSIS — I10 Essential (primary) hypertension: Secondary | ICD-10-CM | POA: Diagnosis not present

## 2017-02-19 DIAGNOSIS — Z6838 Body mass index (BMI) 38.0-38.9, adult: Secondary | ICD-10-CM

## 2017-02-19 DIAGNOSIS — G4730 Sleep apnea, unspecified: Secondary | ICD-10-CM | POA: Diagnosis not present

## 2017-02-19 DIAGNOSIS — K219 Gastro-esophageal reflux disease without esophagitis: Secondary | ICD-10-CM | POA: Diagnosis not present

## 2017-02-19 NOTE — Progress Notes (Signed)
Medical Nutrition Therapy: Visit start time: 2751  end time: 1625  Assessment:  Diagnosis: Type 2 Diabetes, HTN Past medical history: obesity, HLD, GERD, sleep apnea Psychosocial issues/ stress concerns: PTSD (served in [place]) Preferred learning method:  . 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 He is limiting red meats and eating mostly chicken. Reports fasting  BGs in 160s-180s lowest has been 120.   Physical activity: no structured exercise yet, waiting for knee pain to subside. Planning to resume martial arts. Does stay somewhat active caring for horses.   Dietary Intake:  Usual eating pattern includes 2-3 meals and 2-3 snacks per day. Dining out frequency: 1-2 meals per week.  Breakfast: usually none. Sometimes banana.  Snack: yogurt, light Lunch: often salad with cheese, deli meat, often no dressing Snack: Boost protein drink Supper: cooked meal at home, out up to 2x a week, tries to make healthy choices. Snack: usually none; occasionally cheetos, banana chips, celery with peanut butter; apple Beverages: water, unsweetened tea  Nutrition Care Education: Topics covered: Diabetes, weight control Basic nutrition: basic food groups, appropriate nutrient balance, appropriate meal and snack schedule, general nutrition guidelines    Weight control: benefits of weight control, determining reasonable weight loss rate, importance of choosing low sugar and low fat foods, appropriate calorie and carbohydrate intake, role of exercise, balanced meal and snack options. Diabetes: appropriate meal and snack schedule, appropriate carb intake and balance (at least 40% of kcal intake, or 180g  daily), healthy carb choices to control effect on BGs, importance of balancing with healthy protein choices and low-carb vegetables; discussed smoothie options for quick and light breakfast-- advised limiting amount of fruit/ sugar and including protein source like yogurt or peanut butter.   Nutritional Diagnosis:  [place]-2.2 Altered nutrition-related laboratory As related to Diabetes.  As evidenced by HbA1C of 8.2% on 01/19/17. [place]-3.3 Overweight/obesity As related to excess calories, inactivity.  As evidenced by BMI 38, patient report.  Intervention: Instruction as noted above.   Commended patient for changes made thus far.    Set goals with direction from patient.   Education Materials given:  . General diet guidelines for Diabetes . Plate Planner with food lists . Snacking handout x2  Healthy smoothie recipes . Goals/ instructions   Learner/ who was taught:  . Patient   Level of understanding: Marland Kitchen Verbalizes/ demonstrates competency  Demonstrated degree of understanding via:   Teach back Learning barriers: . None  Willingness to learn/ readiness for change: . Eager, change in progress  Monitoring and Evaluation:  Dietary intake, exercise, BG control, and body weight      follow up: 04/02/17

## 2017-02-19 NOTE — Patient Instructions (Signed)
   Eat something every 3-5 hours during the day, starting within 2 hours of waking up, to keep metabolism healthy.   Choose healthy carbs in controlled portions, 3 servings or 45g. with each meal, and 15-45g with each snack.   Continue to control food portions and calories for weight loss, goal is 2-6lbs per month, or 0.5-2lbs per week.   Aiming for 1800 calories daily will help with weight loss, averaging 400-600 calories per meal.

## 2017-04-02 ENCOUNTER — Ambulatory Visit: Payer: Self-pay | Admitting: Dietician

## 2017-04-12 ENCOUNTER — Ambulatory Visit: Payer: Self-pay | Admitting: Dietician

## 2017-04-18 ENCOUNTER — Encounter: Payer: Self-pay | Admitting: Dietician

## 2017-04-18 NOTE — Progress Notes (Signed)
Patient has not yet responded to reschedule after second missed appointment. Sent letter to referring provider.

## 2017-05-14 ENCOUNTER — Other Ambulatory Visit: Payer: Self-pay

## 2017-05-14 DIAGNOSIS — I10 Essential (primary) hypertension: Secondary | ICD-10-CM

## 2017-05-14 DIAGNOSIS — F3341 Major depressive disorder, recurrent, in partial remission: Secondary | ICD-10-CM

## 2017-05-14 DIAGNOSIS — J301 Allergic rhinitis due to pollen: Secondary | ICD-10-CM

## 2017-05-14 DIAGNOSIS — G6189 Other inflammatory polyneuropathies: Secondary | ICD-10-CM

## 2017-05-14 DIAGNOSIS — E785 Hyperlipidemia, unspecified: Secondary | ICD-10-CM

## 2017-05-14 MED ORDER — LOSARTAN POTASSIUM 50 MG PO TABS: 50.0000 mg | ORAL_TABLET | Freq: Every day | ORAL | 0 refills | Status: DC

## 2017-05-14 MED ORDER — SIMVASTATIN 40 MG PO TABS: 40.0000 mg | ORAL_TABLET | Freq: Every day | ORAL | 0 refills | Status: DC

## 2017-05-14 MED ORDER — HYDROCHLOROTHIAZIDE 25 MG PO TABS: ORAL_TABLET | ORAL | 0 refills | Status: DC

## 2017-05-14 MED ORDER — BUPROPION HCL 75 MG PO TABS: 75.0000 mg | ORAL_TABLET | Freq: Every day | ORAL | 0 refills | Status: DC

## 2017-05-14 MED ORDER — GABAPENTIN 300 MG PO CAPS: 300.0000 mg | ORAL_CAPSULE | Freq: Three times a day (TID) | ORAL | 0 refills | Status: DC

## 2017-05-14 MED ORDER — AMLODIPINE BESYLATE 10 MG PO TABS: ORAL_TABLET | ORAL | 0 refills | Status: DC

## 2017-05-14 MED ORDER — ESOMEPRAZOLE MAGNESIUM 40 MG PO CPDR: 40.0000 mg | DELAYED_RELEASE_CAPSULE | Freq: Every day | ORAL | 0 refills | Status: DC

## 2017-05-14 MED ORDER — ESCITALOPRAM OXALATE 20 MG PO TABS: 20.0000 mg | ORAL_TABLET | Freq: Every day | ORAL | 0 refills | Status: DC

## 2017-05-17 ENCOUNTER — Ambulatory Visit: Payer: Managed Care, Other (non HMO) | Admitting: Family Medicine

## 2017-05-17 ENCOUNTER — Encounter: Payer: Self-pay | Admitting: Family Medicine

## 2017-05-17 VITALS — BP 128/80 | HR 80 | Ht 72.0 in | Wt 278.0 lb

## 2017-05-17 DIAGNOSIS — E782 Mixed hyperlipidemia: Secondary | ICD-10-CM | POA: Diagnosis not present

## 2017-05-17 DIAGNOSIS — I10 Essential (primary) hypertension: Secondary | ICD-10-CM | POA: Diagnosis not present

## 2017-05-17 DIAGNOSIS — Z23 Encounter for immunization: Secondary | ICD-10-CM

## 2017-05-17 DIAGNOSIS — F3341 Major depressive disorder, recurrent, in partial remission: Secondary | ICD-10-CM

## 2017-05-17 DIAGNOSIS — R69 Illness, unspecified: Secondary | ICD-10-CM

## 2017-05-17 DIAGNOSIS — G6189 Other inflammatory polyneuropathies: Secondary | ICD-10-CM | POA: Diagnosis not present

## 2017-05-17 MED ORDER — HYDROCHLOROTHIAZIDE 25 MG PO TABS: ORAL_TABLET | ORAL | 1 refills | Status: DC

## 2017-05-17 MED ORDER — GABAPENTIN 300 MG PO CAPS: 300.0000 mg | ORAL_CAPSULE | Freq: Three times a day (TID) | ORAL | 1 refills | Status: DC

## 2017-05-17 MED ORDER — AMLODIPINE BESYLATE 10 MG PO TABS: ORAL_TABLET | ORAL | 1 refills | Status: DC

## 2017-05-17 MED ORDER — BUPROPION HCL 75 MG PO TABS: 75.0000 mg | ORAL_TABLET | Freq: Every day | ORAL | 1 refills | Status: DC

## 2017-05-17 MED ORDER — ESOMEPRAZOLE MAGNESIUM 40 MG PO CPDR: 40.0000 mg | DELAYED_RELEASE_CAPSULE | Freq: Every day | ORAL | 1 refills | Status: DC

## 2017-05-17 MED ORDER — ESCITALOPRAM OXALATE 20 MG PO TABS: 20.0000 mg | ORAL_TABLET | Freq: Every day | ORAL | 1 refills | Status: DC

## 2017-05-17 MED ORDER — SIMVASTATIN 40 MG PO TABS: 40.0000 mg | ORAL_TABLET | Freq: Every day | ORAL | 1 refills | Status: DC

## 2017-05-17 MED ORDER — LOSARTAN POTASSIUM 50 MG PO TABS: 50.0000 mg | ORAL_TABLET | Freq: Every day | ORAL | 1 refills | Status: DC

## 2017-05-17 NOTE — Progress Notes (Signed)
Name: Raymond [name]   MRN: [id number]    DOB: Oct 24, 1955   Date:05/17/2017       Progress Note  Subjective  Chief Complaint  Chief Complaint  Patient presents with  . Hypertension  . Depression  . Gastroesophageal Reflux  . Hyperlipidemia  . Peripheral Neuropathy    Hypertension  This is a chronic problem. The current episode started more than 1 year ago. The problem is unchanged. The problem is controlled. Pertinent negatives include no anxiety, blurred vision, chest pain, headaches, malaise/fatigue, neck pain, orthopnea, palpitations, peripheral edema, PND, shortness of breath or sweats. There are no associated agents to hypertension. There are no known risk factors for coronary artery disease. Past treatments include diuretics, angiotensin blockers and calcium channel blockers. The current treatment provides mild improvement. There are no compliance problems.  There is no history of angina, kidney disease, CAD/MI, CVA, heart failure, left ventricular hypertrophy, PVD or retinopathy. There is no history of chronic renal disease, a hypertension causing med or renovascular disease.  Depression         This is a chronic problem.  The current episode started more than 1 year ago.   The onset quality is sudden.   The problem has been waxing and waning since onset.  Associated symptoms include no decreased concentration, no fatigue, no helplessness, no hopelessness, does not have insomnia, not irritable, no restlessness, no decreased interest, no appetite change, no body aches, no myalgias, no headaches, no indigestion, not sad and no suicidal ideas.     The symptoms are aggravated by nothing.  Past treatments include SSRIs - Selective serotonin reuptake inhibitors (welbutrin).  Compliance with treatment is good.  Previous treatment provided no relief relief.   Pertinent negatives include no hypothyroidism and no anxiety. Gastroesophageal Reflux  He reports no abdominal pain, no belching, no chest  pain, no choking, no coughing, no dysphagia, no early satiety, no globus sensation, no heartburn, no hoarse voice, no nausea, no sore throat, no stridor, no tooth decay, no water brash or no wheezing. This is a chronic problem. The current episode started more than 1 year ago. The problem occurs frequently. The problem has been gradually improving (on increased dose nexium). The symptoms are aggravated by certain foods. Pertinent negatives include no anemia, fatigue, melena, muscle weakness, orthopnea or weight loss. There are no known risk factors. He has tried a PPI for the symptoms. The treatment provided moderate relief.  Hyperlipidemia  This is a chronic problem. The current episode started more than 1 year ago. The problem is controlled. Recent lipid tests were reviewed and are normal. He has no history of chronic renal disease, diabetes, hypothyroidism, liver disease, obesity or nephrotic syndrome. There are no known factors aggravating his hyperlipidemia. Pertinent negatives include no chest pain, focal sensory loss, focal weakness, leg pain, myalgias or shortness of breath. Current antihyperlipidemic treatment includes statins. The current treatment provides moderate improvement of lipids. There are no compliance problems.  Risk factors for coronary artery disease include hypertension, dyslipidemia and male sex.    No problem-specific Assessment & Plan notes found for this encounter.   Past Medical History:  Diagnosis Date  . Allergy   . Arthritis   . Asthma    mild  . Carpal tunnel syndrome    bilateral  . Depression   . Diabetes mellitus without complication ([place])    Type II  . GERD (gastroesophageal reflux disease)   . Hyperlipidemia   . Hypertension    neg stress  test 10 yrs ago - Dr Humphrey Rolls  . Sleep apnea    has BiPAP -doesn't use  . Varicose vein    treated by Dr. Delana Meyer    Past Surgical History:  Procedure Laterality Date  . CHOLECYSTECTOMY    . CHONDROPLASTY  10/05/09    knee- shaving patellofemoral joint  . COLONOSCOPY  2009?  . ENDOVENOUS ABLATION SAPHENOUS VEIN W/ LASER    . JOINT REPLACEMENT    . KNEE ARTHROSCOPY WITH MEDIAL MENISECTOMY Right    Partial medial and lateral menisectomy  . SHOULDER ARTHROSCOPY Left 08/10/2014   Procedure: ARTHROSCOPY SHOULDER WITH LIMITED DEBRIDEMENT, REMOVAL OF LOOSE BODY AND RELEASE OF LONG END BICEPS TENDON;  Surgeon: Leanor Kail, MD;  Location: [facility];  Service: Orthopedics;  Laterality: Left;  . TOTAL HIP ARTHROPLASTY Left 01/25/2016   Procedure: TOTAL HIP ARTHROPLASTY;  Surgeon: Corky Mull, MD;  Location: [facility];  Service: Orthopedics;  Laterality: Left;    No family history on file.  Social History   Socioeconomic History  . Marital status: Married    Spouse name: Not on file  . Number of children: Not on file  . Years of education: Not on file  . Highest education level: Not on file  Occupational History  . Not on file  Social Needs  . Financial resource strain: Not on file  . Food insecurity:    Worry: Not on file    Inability: Not on file  . Transportation needs:    Medical: Not on file    Non-medical: Not on file  Tobacco Use  . Smoking status: Never Smoker  . Smokeless tobacco: Never Used  Substance and Sexual Activity  . Alcohol use: Yes    Comment: rare  . Drug use: No  . Sexual activity: Yes  Lifestyle  . Physical activity:    Days per week: Not on file    Minutes per session: Not on file  . Stress: Not on file  Relationships  . Social connections:    Talks on phone: Not on file    Gets together: Not on file    Attends religious service: Not on file    Active member of club or organization: Not on file    Attends meetings of clubs or organizations: Not on file    Relationship status: Not on file  . Intimate partner violence:    Fear of current or ex partner: Not on file    Emotionally abused: Not on file    Physically abused: Not on file    Forced sexual  activity: Not on file  Other Topics Concern  . Not on file  Social History Narrative  . Not on file    No Known Allergies  Outpatient Medications Prior to Visit  Medication Sig Dispense Refill  . ACCU-CHEK AVIVA PLUS test strip TEST AS DIRECTED 50 each 0  . aspirin 81 MG tablet Take 1 tablet (81 mg total) by mouth daily. AM 30 tablet 11  . Cholecalciferol 1000 UNITS capsule Take 1,000 Units by mouth daily. AM    . cyanocobalamin 1000 MCG tablet Take 1,000 mcg by mouth daily. AM     . empagliflozin (JARDIANCE) 25 MG TABS tablet Take by mouth.    Marland Kitchen glucosamine-chondroitin 500-400 MG tablet Take 1 tablet by mouth daily.    . Lancets 28G MISC Use 1 each once daily. Use as instructed.    . meclizine (ANTIVERT) 25 MG tablet Take 1 tablet (25 mg total)  by mouth 3 (three) times daily as needed for dizziness. 30 tablet 0  . meloxicam (MOBIC) 7.5 MG tablet Take by mouth.    . metFORMIN (GLUCOPHAGE) 500 MG tablet TAKE 1 TABLET BY MOUTH TWO  TIMES DAILY WITH MEALS 180 tablet 1  . omega-3 fish oil (MAXEPA) 1000 MG CAPS capsule Take 1 capsule (1,000 mg total) by mouth daily. AM 90 capsule 4  . amLODipine (NORVASC) 10 MG tablet TAKE 1 TABLET BY MOUTH  DAILY IN THE MORNING 30 tablet 0  . buPROPion (WELLBUTRIN) 75 MG tablet Take 1 tablet (75 mg total) by mouth daily. 30 tablet 0  . escitalopram (LEXAPRO) 20 MG tablet Take 1 tablet (20 mg total) by mouth daily. 30 tablet 0  . esomeprazole (NEXIUM) 40 MG capsule Take 1 capsule (40 mg total) by mouth daily. 30 capsule 0  . gabapentin (NEURONTIN) 300 MG capsule Take 1 capsule (300 mg total) by mouth 3 (three) times daily. 90 capsule 0  . hydrochlorothiazide (HYDRODIURIL) 25 MG tablet TAKE 1 TABLET BY MOUTH  DAILY IN THE MORNING 30 tablet 0  . losartan (COZAAR) 50 MG tablet Take 1 tablet (50 mg total) by mouth daily. 30 tablet 0  . simvastatin (ZOCOR) 40 MG tablet Take 1 tablet (40 mg total) by mouth at bedtime. 30 tablet 0  . fluticasone (FLOVENT HFA) 110  MCG/ACT inhaler Inhale into the lungs.    . triamcinolone cream (KENALOG) 0.1 % Apply 1 application topically 2 (two) times daily. Please pull from [employer] (Patient not taking: Reported on 05/17/2017) 30 g 0   No facility-administered medications prior to visit.     Review of Systems  Constitutional: Negative for appetite change, fatigue, malaise/fatigue and weight loss.  HENT: Negative for hoarse voice and sore throat.   Eyes: Negative for blurred vision.  Respiratory: Negative for cough, choking, shortness of breath and wheezing.   Cardiovascular: Negative for chest pain, palpitations, orthopnea and PND.  Gastrointestinal: Negative for abdominal pain, dysphagia, heartburn, melena and nausea.  Musculoskeletal: Negative for myalgias, muscle weakness and neck pain.  Neurological: Negative for focal weakness and headaches.  Psychiatric/Behavioral: Positive for depression. Negative for decreased concentration and suicidal ideas. The patient does not have insomnia.      Objective  Vitals:   05/17/17 0819  BP: 128/80  Pulse: 80  Weight: 278 lb (126.1 kg)  Height: 6' (1.829 m)    Physical Exam  Constitutional: He is oriented to person, place, and time and well-developed, well-nourished, and in no distress. He is not irritable.  HENT:  Head: Normocephalic.  Right Ear: External ear normal.  Left Ear: External ear normal.  Nose: Nose normal.  Mouth/Throat: Oropharynx is clear and moist.  Eyes: Pupils are equal, round, and reactive to light. Conjunctivae and EOM are normal. Right eye exhibits no discharge. Left eye exhibits no discharge. No scleral icterus.  Neck: Normal range of motion. Neck supple. No JVD present. No tracheal deviation present. No thyromegaly present.  Cardiovascular: Normal rate, regular rhythm, normal heart sounds and intact distal pulses. Exam reveals no gallop and no friction rub.  No murmur heard. Pulmonary/Chest: Breath sounds normal. No respiratory distress. He  has no wheezes. He has no rales.  Abdominal: Soft. Bowel sounds are normal. He exhibits no mass. There is no hepatosplenomegaly. There is no tenderness. There is no rebound, no guarding and no CVA tenderness.  Musculoskeletal: Normal range of motion. He exhibits no edema or tenderness.  Lymphadenopathy:    He has no  cervical adenopathy.  Neurological: He is alert and oriented to person, place, and time. He has normal sensation, normal strength, normal reflexes and intact cranial nerves. No cranial nerve deficit.  Skin: Skin is warm. No rash noted.  Psychiatric: Mood and affect normal.  Nursing note and vitals reviewed.     Assessment & Plan  Problem List Items Addressed This Visit      Cardiovascular and Mediastinum   Essential hypertension - Primary   Relevant Medications   amLODipine (NORVASC) 10 MG tablet   hydrochlorothiazide (HYDRODIURIL) 25 MG tablet   losartan (COZAAR) 50 MG tablet   simvastatin (ZOCOR) 40 MG tablet   Other Relevant Orders   Renal Function Panel     Other   Depression   Relevant Medications   buPROPion (WELLBUTRIN) 75 MG tablet   escitalopram (LEXAPRO) 20 MG tablet   Hyperlipidemia   Relevant Medications   amLODipine (NORVASC) 10 MG tablet   hydrochlorothiazide (HYDRODIURIL) 25 MG tablet   losartan (COZAAR) 50 MG tablet   simvastatin (ZOCOR) 40 MG tablet   Other Relevant Orders   Lipid Panel With LDL/HDL Ratio    Other Visit Diagnoses    Other inflammatory polyneuropathies ([place])       Relevant Medications   buPROPion (WELLBUTRIN) 75 MG tablet   escitalopram (LEXAPRO) 20 MG tablet   gabapentin (NEURONTIN) 300 MG capsule   Taking medication for chronic disease       Relevant Orders   Hepatic function panel   Need for diphtheria-tetanus-pertussis (Tdap) vaccine       Relevant Orders   Tdap vaccine greater than or equal to 7yo IM   Need for 23-polyvalent pneumococcal polysaccharide vaccine       Relevant Orders   Pneumococcal polysaccharide  vaccine 23-valent greater than or equal to 2yo subcutaneous/IM      Meds ordered this encounter  Medications  . amLODipine (NORVASC) 10 MG tablet    Sig: TAKE 1 TABLET BY MOUTH  DAILY IN THE MORNING    Dispense:  90 tablet    Refill:  1  . buPROPion (WELLBUTRIN) 75 MG tablet    Sig: Take 1 tablet (75 mg total) by mouth daily.    Dispense:  90 tablet    Refill:  1  . escitalopram (LEXAPRO) 20 MG tablet    Sig: Take 1 tablet (20 mg total) by mouth daily.    Dispense:  90 tablet    Refill:  1  . esomeprazole (NEXIUM) 40 MG capsule    Sig: Take 1 capsule (40 mg total) by mouth daily.    Dispense:  90 capsule    Refill:  1  . gabapentin (NEURONTIN) 300 MG capsule    Sig: Take 1 capsule (300 mg total) by mouth 3 (three) times daily.    Dispense:  270 capsule    Refill:  1  . hydrochlorothiazide (HYDRODIURIL) 25 MG tablet    Sig: TAKE 1 TABLET BY MOUTH  DAILY IN THE MORNING    Dispense:  90 tablet    Refill:  1  . losartan (COZAAR) 50 MG tablet    Sig: Take 1 tablet (50 mg total) by mouth daily.    Dispense:  90 tablet    Refill:  1  . simvastatin (ZOCOR) 40 MG tablet    Sig: Take 1 tablet (40 mg total) by mouth at bedtime.    Dispense:  90 tablet    Refill:  1      Dr. Otilio Miu  [facility]  05/17/17

## 2017-05-18 LAB — LIPID PANEL WITH LDL/HDL RATIO
CHOLESTEROL TOTAL: 190 mg/dL (ref 100–199)
HDL: 50 mg/dL (ref >39)
LDL CALC: 119 mg/dL — AB (ref 0–99)
LDl/HDL Ratio: 2.4 ratio (ref 0.0–3.6)
TRIGLYCERIDES: 104 mg/dL (ref 0–149)
VLDL Cholesterol Cal: 21 mg/dL (ref 5–40)

## 2017-05-18 LAB — RENAL FUNCTION PANEL
ALBUMIN: 4.6 g/dL (ref 3.6–4.8)
BUN / CREAT RATIO: 14 (ref 10–24)
BUN: 11 mg/dL (ref 8–27)
CALCIUM: 9.6 mg/dL (ref 8.6–10.2)
CHLORIDE: 98 mmol/L (ref 96–106)
CO2: 24 mmol/L (ref 20–29)
CREATININE: 0.8 mg/dL (ref 0.76–1.27)
GFR calc Af Amer: 111 mL/min/{1.73_m2} (ref >59)
GFR calc non Af Amer: 96 mL/min/{1.73_m2} (ref >59)
Glucose: 167 mg/dL — ABNORMAL HIGH (ref 65–99)
Phosphorus: 3.7 mg/dL (ref 2.5–4.5)
Potassium: 4 mmol/L (ref 3.5–5.2)
Sodium: 139 mmol/L (ref 134–144)

## 2017-05-18 LAB — HEPATIC FUNCTION PANEL
ALT: 19 IU/L (ref 0–44)
AST: 17 IU/L (ref 0–40)
Alkaline Phosphatase: 69 IU/L (ref 39–117)
BILIRUBIN TOTAL: 0.7 mg/dL (ref 0.0–1.2)
Bilirubin, Direct: 0.18 mg/dL (ref 0.00–0.40)
Total Protein: 7.7 g/dL (ref 6.0–8.5)

## 2017-07-17 ENCOUNTER — Other Ambulatory Visit: Payer: Self-pay | Admitting: Physician Assistant

## 2017-07-18 ENCOUNTER — Other Ambulatory Visit: Payer: Self-pay | Admitting: Orthopedic Surgery

## 2017-07-18 DIAGNOSIS — M2392 Unspecified internal derangement of left knee: Secondary | ICD-10-CM

## 2017-07-25 ENCOUNTER — Ambulatory Visit
Admission: RE | Admit: 2017-07-25 | Discharge: 2017-07-25 | Disposition: A | Discharge status: Home / Self Care | Payer: Managed Care, Other (non HMO) | Source: Ambulatory Visit | Attending: Surgery | Admitting: Surgery

## 2017-07-25 ENCOUNTER — Ambulatory Visit: Payer: Managed Care, Other (non HMO) | Admitting: Anesthesiology

## 2017-07-25 ENCOUNTER — Encounter
Admission: RE | Disposition: A | Discharge status: Home / Self Care | Payer: Self-pay | Source: Ambulatory Visit | Attending: Surgery

## 2017-07-25 DIAGNOSIS — E1142 Type 2 diabetes mellitus with diabetic polyneuropathy: Secondary | ICD-10-CM | POA: Diagnosis not present

## 2017-07-25 DIAGNOSIS — Z96642 Presence of left artificial hip joint: Secondary | ICD-10-CM | POA: Insufficient documentation

## 2017-07-25 DIAGNOSIS — Z79899 Other long term (current) drug therapy: Secondary | ICD-10-CM | POA: Insufficient documentation

## 2017-07-25 DIAGNOSIS — Z8249 Family history of ischemic heart disease and other diseases of the circulatory system: Secondary | ICD-10-CM | POA: Insufficient documentation

## 2017-07-25 DIAGNOSIS — E785 Hyperlipidemia, unspecified: Secondary | ICD-10-CM | POA: Insufficient documentation

## 2017-07-25 DIAGNOSIS — Z6837 Body mass index (BMI) 37.0-37.9, adult: Secondary | ICD-10-CM | POA: Insufficient documentation

## 2017-07-25 DIAGNOSIS — M1712 Unilateral primary osteoarthritis, left knee: Secondary | ICD-10-CM | POA: Insufficient documentation

## 2017-07-25 DIAGNOSIS — X501XXA Overexertion from prolonged static or awkward postures, initial encounter: Secondary | ICD-10-CM | POA: Insufficient documentation

## 2017-07-25 DIAGNOSIS — K219 Gastro-esophageal reflux disease without esophagitis: Secondary | ICD-10-CM | POA: Diagnosis not present

## 2017-07-25 DIAGNOSIS — S83242A Other tear of medial meniscus, current injury, left knee, initial encounter: Secondary | ICD-10-CM | POA: Diagnosis not present

## 2017-07-25 DIAGNOSIS — I10 Essential (primary) hypertension: Secondary | ICD-10-CM | POA: Insufficient documentation

## 2017-07-25 DIAGNOSIS — Z7982 Long term (current) use of aspirin: Secondary | ICD-10-CM | POA: Diagnosis not present

## 2017-07-25 DIAGNOSIS — Z794 Long term (current) use of insulin: Secondary | ICD-10-CM | POA: Insufficient documentation

## 2017-07-25 DIAGNOSIS — G4730 Sleep apnea, unspecified: Secondary | ICD-10-CM | POA: Insufficient documentation

## 2017-07-25 HISTORY — PX: KNEE ARTHROSCOPY: SHX127

## 2017-07-25 LAB — GLUCOSE, CAPILLARY
GLUCOSE-CAPILLARY: 196 mg/dL — AB (ref 65–99)
Glucose-Capillary: 220 mg/dL — ABNORMAL HIGH (ref 65–99)

## 2017-07-25 SURGERY — ARTHROSCOPY, KNEE
Anesthesia: General | Laterality: Left | Wound class: "Clean "

## 2017-07-25 MED ORDER — MIDAZOLAM HCL 5 MG/5ML IJ SOLN
INTRAMUSCULAR | Status: DC | PRN
  Administered 2017-07-25: 2 mg via INTRAVENOUS

## 2017-07-25 MED ORDER — OXYCODONE HCL 5 MG/5ML PO SOLN: 5.0000 mg | Freq: Once | ORAL | Status: DC | PRN

## 2017-07-25 MED ORDER — OXYCODONE HCL 5 MG PO TABS: 5.0000 mg | ORAL_TABLET | Freq: Once | ORAL | Status: DC | PRN

## 2017-07-25 MED ORDER — OXYCODONE HCL 5 MG PO TABS: 5.0000 mg | ORAL_TABLET | ORAL | 0 refills | Status: DC | PRN

## 2017-07-25 MED ORDER — POTASSIUM CHLORIDE IN NACL 20-0.9 MEQ/L-% IV SOLN: INTRAVENOUS | Status: DC

## 2017-07-25 MED ORDER — OXYCODONE HCL 5 MG PO TABS: 5.0000 mg | ORAL_TABLET | ORAL | Status: DC | PRN

## 2017-07-25 MED ORDER — DEXMEDETOMIDINE HCL 200 MCG/2ML IV SOLN
INTRAVENOUS | Status: DC | PRN
  Administered 2017-07-25: 4 ug via INTRAVENOUS

## 2017-07-25 MED ORDER — METOCLOPRAMIDE HCL 5 MG PO TABS: 5.0000 mg | ORAL_TABLET | Freq: Three times a day (TID) | ORAL | Status: DC | PRN

## 2017-07-25 MED ORDER — LIDOCAINE-EPINEPHRINE 1 %-1:100000 IJ SOLN
INTRAMUSCULAR | Status: DC | PRN
  Administered 2017-07-25: 20 mL
  Administered 2017-07-25: 30 mL

## 2017-07-25 MED ORDER — LIDOCAINE HCL (CARDIAC) PF 100 MG/5ML IV SOSY
PREFILLED_SYRINGE | INTRAVENOUS | Status: DC | PRN
  Administered 2017-07-25: 40 mg via INTRATRACHEAL

## 2017-07-25 MED ORDER — DEXAMETHASONE SODIUM PHOSPHATE 4 MG/ML IJ SOLN
INTRAMUSCULAR | Status: DC | PRN
  Administered 2017-07-25: 4 mg via INTRAVENOUS

## 2017-07-25 MED ORDER — ONDANSETRON HCL 4 MG/2ML IJ SOLN: 4.0000 mg | Freq: Four times a day (QID) | INTRAMUSCULAR | Status: DC | PRN

## 2017-07-25 MED ORDER — EPHEDRINE SULFATE 50 MG/ML IJ SOLN
INTRAMUSCULAR | Status: DC | PRN
  Administered 2017-07-25 (×2): 10 mg via INTRAVENOUS

## 2017-07-25 MED ORDER — LACTATED RINGERS IV SOLN
1000.0000 mL | INTRAVENOUS | Status: DC
  Administered 2017-07-25: 1000 mL via INTRAVENOUS

## 2017-07-25 MED ORDER — ONDANSETRON HCL 4 MG PO TABS: 4.0000 mg | ORAL_TABLET | Freq: Four times a day (QID) | ORAL | Status: DC | PRN

## 2017-07-25 MED ORDER — METOCLOPRAMIDE HCL 5 MG/ML IJ SOLN: 5.0000 mg | Freq: Three times a day (TID) | INTRAMUSCULAR | Status: DC | PRN

## 2017-07-25 MED ORDER — ONDANSETRON HCL 4 MG/2ML IJ SOLN
INTRAMUSCULAR | Status: DC | PRN
  Administered 2017-07-25: 4 mg via INTRAVENOUS

## 2017-07-25 MED ORDER — DEXTROSE 5 % IV SOLN
3000.0000 mg | Freq: Once | INTRAVENOUS | Status: AC
  Administered 2017-07-25: 3000 mg via INTRAVENOUS

## 2017-07-25 MED ORDER — INSULIN REGULAR HUMAN 100 UNIT/ML IJ SOLN
3.0000 [IU] | Freq: Once | INTRAMUSCULAR | Status: AC
  Administered 2017-07-25: 3 [IU] via SUBCUTANEOUS

## 2017-07-25 MED ORDER — PROPOFOL 10 MG/ML IV BOLUS
INTRAVENOUS | Status: DC | PRN
  Administered 2017-07-25: 200 mg via INTRAVENOUS

## 2017-07-25 MED ORDER — BUPIVACAINE-EPINEPHRINE (PF) 0.5% -1:200000 IJ SOLN
INTRAMUSCULAR | Status: DC | PRN
  Administered 2017-07-25: 30 mL

## 2017-07-25 SURGICAL SUPPLY — 26 items
BANDAGE ELASTIC 6 LF NS (GAUZE/BANDAGES/DRESSINGS) ×3 IMPLANT
BLADE FULL RADIUS 3.5 (BLADE) ×3 IMPLANT
CHLORAPREP W/TINT 26ML (MISCELLANEOUS) ×3 IMPLANT
COVER LIGHT HANDLE UNIVERSAL (MISCELLANEOUS) ×6 IMPLANT
CUFF TOURN SGL QUICK 34 (TOURNIQUET CUFF) ×2
CUFF TRNQT CYL 34X4X40X1 (TOURNIQUET CUFF) IMPLANT
DRAPE IMP U-DRAPE 54X76 (DRAPES) ×3 IMPLANT
GAUZE SPONGE 4X4 12PLY STRL (GAUZE/BANDAGES/DRESSINGS) ×3 IMPLANT
GLOVE BIO SURGEON STRL SZ8 (GLOVE) ×6 IMPLANT
GLOVE INDICATOR 8.0 STRL GRN (GLOVE) ×3 IMPLANT
GOWN STRL REUS W/ TWL LRG LVL3 (GOWN DISPOSABLE) ×1 IMPLANT
GOWN STRL REUS W/ TWL XL LVL3 (GOWN DISPOSABLE) ×1 IMPLANT
GOWN STRL REUS W/TWL LRG LVL3 (GOWN DISPOSABLE) ×2
GOWN STRL REUS W/TWL XL LVL3 (GOWN DISPOSABLE) ×2
IV LACTATED RINGER IRRG 3000ML (IV SOLUTION) ×2
IV LR IRRIG 3000ML ARTHROMATIC (IV SOLUTION) ×2 IMPLANT
KIT TURNOVER KIT A (KITS) ×3 IMPLANT
MANIFOLD 4PT FOR NEPTUNE1 (MISCELLANEOUS) ×3 IMPLANT
NDL HYPO 21X1.5 SAFETY (NEEDLE) ×2 IMPLANT
NEEDLE HYPO 21X1.5 SAFETY (NEEDLE) ×6 IMPLANT
PACK ARTHROSCOPY KNEE (MISCELLANEOUS) ×3 IMPLANT
STRAP BODY AND KNEE 60X3 (MISCELLANEOUS) ×3 IMPLANT
SUT PROLENE 4 0 PS 2 18 (SUTURE) ×3 IMPLANT
SYR 50ML LL SCALE MARK (SYRINGE) ×3 IMPLANT
TUBING ARTHRO INFLOW-ONLY STRL (TUBING) ×3 IMPLANT
WAND HAND CNTRL MULTIVAC 90 (MISCELLANEOUS) ×2 IMPLANT

## 2017-07-25 NOTE — Anesthesia Procedure Notes (Signed)
Procedure Name: LMA Insertion Date/Time: 07/25/2017 12:21 PM Performed by: Janna Arch, CRNA Pre-anesthesia Checklist: Patient identified, Emergency Drugs available, Suction available and Patient being monitored Patient Re-evaluated:Patient Re-evaluated prior to induction Oxygen Delivery Method: Circle system utilized Preoxygenation: Pre-oxygenation with 100% oxygen Induction Type: IV induction Ventilation: Mask ventilation without difficulty LMA: LMA inserted LMA Size: 5.0 Placement Confirmation: breath sounds checked- equal and bilateral Tube secured with: Tape Dental Injury: Teeth and Oropharynx as per pre-operative assessment

## 2017-07-25 NOTE — H&P (Signed)
Paper H&P to be scanned into permanent record. H&P reviewed and patient re-examined. No changes. 

## 2017-07-25 NOTE — Transfer of Care (Signed)
Immediate Anesthesia Transfer of Care Note  Patient: Raymond [name]  Procedure(s) Performed: ARTHROSCOPY KNEE  WITH DEBRIDEMENT AND PARTIAL MEDIAL MENISCECTOMY (Left )  Patient Location: PACU  Anesthesia Type: General  Level of Consciousness: awake, alert  and patient cooperative  Airway and Oxygen Therapy: Patient Spontanous Breathing and Patient connected to supplemental oxygen  Post-op Assessment: Post-op Vital signs reviewed, Patient's Cardiovascular Status Stable, Respiratory Function Stable, Patent Airway and No signs of Nausea or vomiting  Post-op Vital Signs: Reviewed and stable  Complications: No apparent anesthesia complications

## 2017-07-25 NOTE — Op Note (Signed)
07/25/2017  1:27 PM  Patient:   Raymond [name]  Pre-Op Diagnosis:   Medial meniscus tear with underlying degenerative joint disease, left knee.  Postoperative diagnosis:   Same.  Procedure:   Arthroscopic partial medial meniscectomy; arthroscopic debridement; and arthroscopic abrasion chondroplasty of medial femoral condyle, lateral tibial plateau, and femoral trochlea; left knee.  Surgeon:   Pascal Lux, M.D.  Anesthesia:   General LMA.  Findings:   As above. There were areas of grade III-IV chondromalacia involving the medial femoral condyle, the medial tibial plateau, and the femoral trochlea. There were areas of grade 2-3 chondromalacial changes involving the patella and lateral tibial plateau. The anterior and posterior cruciate ligaments both were in satisfactory condition. There was some degenerative fraying of the central portion of the lateral meniscus as well.  Complications:   None.  EBL:   15 cc.  Total fluids:   600 cc of crystalloid.  Tourniquet time:   None  Drains:   None  Closure:   4-0 Prolene interrupted sutures.  Brief clinical note:   The patient is a 62 year old male with a one-month history of sudden increased medial sided left knee pain following a twisting injury at home. His symptoms have persisted despite medications, activity modification, etc. His history and examination are consistent with a medial meniscus tear which was confirmed by MRI scan. The patient presents at this time for arthroscopy, debridement, and partial medial meniscectomy.  Procedure:   The patient was brought into the operating room and lain in the supine position. After adequate general laryngeal mask anesthesia was obtained, a timeout was performed to verify the appropriate side. The patient's left knee was injected sterilely using a solution of 30 cc of 1% lidocaine and 30 cc of 0.5% Sensorcaine with epinephrine. The left lower extremity was prepped with ChloraPrep solution before  being draped sterilely. Preoperative antibiotics were administered. The expected portal sites were injected with 0.5% Sensorcaine with epinephrine before the camera was placed in the anterolateral portal and instrumentation performed through the anteromedial portal. The knee was sequentially examined beginning in the suprapatellar pouch, then progressing to the patellofemoral space, the medial gutter compartment, the notch, and finally the lateral compartment and gutter. The findings were as described above. Abundant reactive synovial tissues anteriorly were debrided using the full-radius resector in order to improve visualization. The torn portion of the medial meniscus was debrided back to stable margins using the straight and angled baskets as well as the full-radius resector. Subsequent probing of the remaining rim demonstrated excellent stability. Areas of unstable articular cartilage involving the medial femoral condyle, lateral tibial plateau, and the femoral trochlea were debrided back to stable margins using the full-radius resector. The ArthroCare wand was used to lightly "anneal" the margins of the articular cartilage, as well as to seal the torn portion of the medial meniscus. The instruments were removed from the joint after suctioning the excess fluid. The portal sites were closed using 4-0 Prolene interrupted sutures before a sterile bulky dressing was applied to the knee. The patient was then awakened, extubated, and returned to the recovery room in satisfactory condition after tolerating the procedure well.

## 2017-07-25 NOTE — Discharge Instructions (Signed)
General Anesthesia, Adult, Care After These instructions provide you with information about caring for yourself after your procedure. Your health care provider may also give you more specific instructions. Your treatment has been planned according to current medical practices, but problems sometimes occur. Call your health care provider if you have any problems or questions after your procedure. What can I expect after the procedure? After the procedure, it is common to have:  Vomiting.  A sore throat.  Mental slowness.  It is common to feel:  Nauseous.  Cold or shivery.  Sleepy.  Tired.  Sore or achy, even in parts of your body where you did not have surgery.  Follow these instructions at home: For at least 24 hours after the procedure:  Do not: ? Participate in activities where you could fall or become injured. ? Drive. ? Use heavy machinery. ? Drink alcohol. ? Take sleeping pills or medicines that cause drowsiness. ? Make important decisions or sign legal documents. ? Take care of children on your own.  Rest. Eating and drinking  If you vomit, drink water, juice, or soup when you can drink without vomiting.  Drink enough fluid to keep your urine clear or pale yellow.  Make sure you have little or no nausea before eating solid foods.  Follow the diet recommended by your health care provider. General instructions  Have a responsible adult stay with you until you are awake and alert.  Return to your normal activities as told by your health care provider. Ask your health care provider what activities are safe for you.  Take over-the-counter and prescription medicines only as told by your health care provider.  If you smoke, do not smoke without supervision.  Keep all follow-up visits as told by your health care provider. This is important. Contact a health care provider if:  You continue to have nausea or vomiting at home, and medicines are not helpful.  You  cannot drink fluids or start eating again.  You cannot urinate after 8-12 hours.  You develop a skin rash.  You have fever.  You have increasing redness at the site of your procedure. Get help right away if:  You have difficulty breathing.  You have chest pain.  You have unexpected bleeding.  You feel that you are having a life-threatening or urgent problem. This information is not intended to replace advice given to you by your health care provider. Make sure you discuss any questions you have with your health care provider. Document Released: 05/15/2000 Document Revised: 07/12/2015 Document Reviewed: 01/21/2015 Elsevier Interactive Patient Education  2018 [place] discharge instructions: Keep dressing dry and intact.  May shower after dressing changed on post-op day #4 (Sunday).  Cover sutures with Band-Aids after drying off. Apply ice frequently to knee. Take Aleve 2 tablets BID with meals for 7-10 days, then as necessary. Take pain medication as prescribed or ES Tylenol when needed.  May weight-bear as tolerated - use crutches or walker as needed. Follow-up in 10-14 days or as scheduled.

## 2017-07-25 NOTE — Anesthesia Postprocedure Evaluation (Signed)
Anesthesia Post Note  Patient: Raymond [name]  Procedure(s) Performed: Arthroscopic partial medial meniscectomy; arthroscopic debridement; and arthroscopic abrasion chondroplasty of medial femoral condyle, lateral tibial plateau, and femoral trochlea; left knee. (Left )  Patient location during evaluation: PACU Anesthesia Type: General Level of consciousness: awake and alert Pain management: pain level controlled Vital Signs Assessment: post-procedure vital signs reviewed and stable Respiratory status: spontaneous breathing, nonlabored ventilation, respiratory function stable and patient connected to nasal cannula oxygen Cardiovascular status: blood pressure returned to baseline and stable Postop Assessment: no apparent nausea or vomiting Anesthetic complications: no    Kodi Steil

## 2017-07-25 NOTE — Anesthesia Preprocedure Evaluation (Addendum)
Anesthesia Evaluation  Patient identified by MRN, date of birth, ID band  Reviewed: NPO status   History of Anesthesia Complications Negative for: history of anesthetic complications  Airway Mallampati: II  TM Distance: >3 FB Neck ROM: full    Dental no notable dental hx.    Pulmonary sleep apnea and Continuous Positive Airway Pressure Ventilation ,    Pulmonary exam normal        Cardiovascular Exercise Tolerance: Good hypertension, Normal cardiovascular exam     Neuro/Psych Depression neuropathy negative neurological ROS  negative psych ROS   GI/Hepatic Neg liver ROS, GERD  Controlled,  Endo/Other  diabetes, Type 2, Oral Hypoglycemic AgentsMorbid obesity (bmi=37)  Renal/GU negative Renal ROS  negative genitourinary   Musculoskeletal  (+) Arthritis ,   Abdominal   Peds  Hematology negative hematology ROS (+)   Anesthesia Other Findings ekg: 07/2016: nsr;  stress: 08/2016: normal; ef=55%;     Reproductive/Obstetrics                           Anesthesia Physical Anesthesia Plan  ASA: II  Anesthesia Plan: General   Post-op Pain Management:    Induction:   PONV Risk Score and Plan:   Airway Management Planned:   Additional Equipment:   Intra-op Plan:   Post-operative Plan:   Informed Consent: I have reviewed the patients History and Physical, chart, labs and discussed the procedure including the risks, benefits and alternatives for the proposed anesthesia with the patient or authorized representative who has indicated his/her understanding and acceptance.     Plan Discussed with: CRNA  Anesthesia Plan Comments:         Anesthesia Quick Evaluation

## 2017-07-26 ENCOUNTER — Encounter: Payer: Self-pay | Admitting: Surgery

## 2017-07-26 DIAGNOSIS — S83232A Complex tear of medial meniscus, current injury, left knee, initial encounter: Secondary | ICD-10-CM | POA: Insufficient documentation

## 2017-07-30 ENCOUNTER — Ambulatory Visit: Payer: Managed Care, Other (non HMO)

## 2017-08-03 DIAGNOSIS — Z9889 Other specified postprocedural states: Secondary | ICD-10-CM | POA: Insufficient documentation

## 2017-08-03 DIAGNOSIS — E669 Obesity, unspecified: Secondary | ICD-10-CM | POA: Insufficient documentation

## 2017-08-06 DIAGNOSIS — S86912A Strain of unspecified muscle(s) and tendon(s) at lower leg level, left leg, initial encounter: Secondary | ICD-10-CM | POA: Insufficient documentation

## 2017-09-15 ENCOUNTER — Other Ambulatory Visit: Payer: Self-pay | Admitting: Family Medicine

## 2017-09-15 DIAGNOSIS — G6189 Other inflammatory polyneuropathies: Secondary | ICD-10-CM

## 2017-09-15 DIAGNOSIS — F3341 Major depressive disorder, recurrent, in partial remission: Secondary | ICD-10-CM

## 2017-12-28 ENCOUNTER — Other Ambulatory Visit: Payer: Self-pay | Admitting: Orthopedic Surgery

## 2017-12-28 DIAGNOSIS — M5440 Lumbago with sciatica, unspecified side: Secondary | ICD-10-CM

## 2017-12-28 DIAGNOSIS — M5441 Lumbago with sciatica, right side: Principal | ICD-10-CM

## 2017-12-28 DIAGNOSIS — G8929 Other chronic pain: Secondary | ICD-10-CM

## 2017-12-28 DIAGNOSIS — M5137 Other intervertebral disc degeneration, lumbosacral region: Secondary | ICD-10-CM

## 2017-12-28 DIAGNOSIS — M545 Low back pain, unspecified: Secondary | ICD-10-CM

## 2018-01-01 ENCOUNTER — Ambulatory Visit
Admission: RE | Admit: 2018-01-01 | Discharge: 2018-01-01 | Disposition: A | Discharge status: Home / Self Care | Payer: Managed Care, Other (non HMO) | Source: Ambulatory Visit | Attending: Orthopedic Surgery | Admitting: Orthopedic Surgery

## 2018-01-01 DIAGNOSIS — M5441 Lumbago with sciatica, right side: Secondary | ICD-10-CM | POA: Insufficient documentation

## 2018-01-01 DIAGNOSIS — M5137 Other intervertebral disc degeneration, lumbosacral region: Secondary | ICD-10-CM | POA: Insufficient documentation

## 2018-01-01 DIAGNOSIS — M545 Low back pain, unspecified: Secondary | ICD-10-CM

## 2018-01-01 DIAGNOSIS — G8929 Other chronic pain: Secondary | ICD-10-CM

## 2018-01-01 DIAGNOSIS — M5440 Lumbago with sciatica, unspecified side: Secondary | ICD-10-CM | POA: Diagnosis not present

## 2018-01-01 DIAGNOSIS — Z9181 History of falling: Secondary | ICD-10-CM | POA: Insufficient documentation

## 2018-01-01 DIAGNOSIS — M51379 Other intervertebral disc degeneration, lumbosacral region without mention of lumbar back pain or lower extremity pain: Secondary | ICD-10-CM

## 2018-01-01 DIAGNOSIS — M5136 Other intervertebral disc degeneration, lumbar region: Secondary | ICD-10-CM | POA: Insufficient documentation

## 2018-01-11 ENCOUNTER — Other Ambulatory Visit: Payer: Self-pay | Admitting: Orthopedic Surgery

## 2018-01-11 DIAGNOSIS — M5416 Radiculopathy, lumbar region: Secondary | ICD-10-CM

## 2018-01-16 ENCOUNTER — Ambulatory Visit
Admission: RE | Admit: 2018-01-16 | Discharge: 2018-01-16 | Disposition: A | Discharge status: Home / Self Care | Payer: Managed Care, Other (non HMO) | Source: Ambulatory Visit | Attending: Orthopedic Surgery | Admitting: Orthopedic Surgery

## 2018-01-16 DIAGNOSIS — M5416 Radiculopathy, lumbar region: Secondary | ICD-10-CM

## 2018-01-16 MED ORDER — METHYLPREDNISOLONE ACETATE 40 MG/ML INJ SUSP (RADIOLOG
120.0000 mg | Freq: Once | INTRAMUSCULAR | Status: AC
  Administered 2018-01-16: 120 mg via EPIDURAL

## 2018-01-16 MED ORDER — IOPAMIDOL (ISOVUE-M 200) INJECTION 41%
1.0000 mL | Freq: Once | INTRAMUSCULAR | Status: AC
  Administered 2018-01-16: 1 mL via EPIDURAL

## 2018-01-16 NOTE — Discharge Instructions (Signed)

## 2018-01-24 ENCOUNTER — Other Ambulatory Visit: Payer: Self-pay

## 2018-01-28 ENCOUNTER — Other Ambulatory Visit: Payer: Self-pay | Admitting: Family Medicine

## 2018-01-28 DIAGNOSIS — I10 Essential (primary) hypertension: Secondary | ICD-10-CM

## 2018-02-02 ENCOUNTER — Other Ambulatory Visit: Payer: Self-pay | Admitting: Family Medicine

## 2018-02-02 DIAGNOSIS — I10 Essential (primary) hypertension: Secondary | ICD-10-CM

## 2018-02-02 DIAGNOSIS — E782 Mixed hyperlipidemia: Secondary | ICD-10-CM

## 2018-02-04 ENCOUNTER — Ambulatory Visit: Payer: Managed Care, Other (non HMO) | Admitting: Family Medicine

## 2018-02-04 ENCOUNTER — Encounter: Payer: Self-pay | Admitting: Family Medicine

## 2018-02-04 DIAGNOSIS — F3341 Major depressive disorder, recurrent, in partial remission: Secondary | ICD-10-CM | POA: Diagnosis not present

## 2018-02-04 DIAGNOSIS — I10 Essential (primary) hypertension: Secondary | ICD-10-CM | POA: Diagnosis not present

## 2018-02-04 DIAGNOSIS — G6189 Other inflammatory polyneuropathies: Secondary | ICD-10-CM

## 2018-02-04 DIAGNOSIS — E782 Mixed hyperlipidemia: Secondary | ICD-10-CM | POA: Diagnosis not present

## 2018-02-04 DIAGNOSIS — E119 Type 2 diabetes mellitus without complications: Secondary | ICD-10-CM

## 2018-02-04 MED ORDER — GABAPENTIN 300 MG PO CAPS: ORAL_CAPSULE | ORAL | 1 refills | Status: DC

## 2018-02-04 MED ORDER — ESCITALOPRAM OXALATE 10 MG PO TABS: 10.0000 mg | ORAL_TABLET | Freq: Every day | ORAL | 1 refills | Status: DC

## 2018-02-04 MED ORDER — METFORMIN HCL 500 MG PO TABS: ORAL_TABLET | ORAL | 1 refills | Status: DC

## 2018-02-04 MED ORDER — ESOMEPRAZOLE MAGNESIUM 40 MG PO CPDR: DELAYED_RELEASE_CAPSULE | ORAL | 1 refills | Status: DC

## 2018-02-04 MED ORDER — BUPROPION HCL 75 MG PO TABS: 75.0000 mg | ORAL_TABLET | Freq: Every day | ORAL | 1 refills | Status: DC

## 2018-02-04 MED ORDER — EMPAGLIFLOZIN 25 MG PO TABS: 25.0000 mg | ORAL_TABLET | Freq: Every day | ORAL | 1 refills | Status: DC

## 2018-02-04 MED ORDER — LOSARTAN POTASSIUM 50 MG PO TABS: 50.0000 mg | ORAL_TABLET | Freq: Every day | ORAL | 1 refills | Status: DC

## 2018-02-04 MED ORDER — HYDROCHLOROTHIAZIDE 25 MG PO TABS: ORAL_TABLET | ORAL | 1 refills | Status: DC

## 2018-02-04 MED ORDER — AMLODIPINE BESYLATE 10 MG PO TABS: ORAL_TABLET | ORAL | 1 refills | Status: DC

## 2018-02-04 MED ORDER — SIMVASTATIN 40 MG PO TABS: ORAL_TABLET | ORAL | 1 refills | Status: DC

## 2018-02-04 NOTE — Progress Notes (Signed)
Date:  02/04/2018   Name:  Raymond [name]   DOB:  [birth year]   MRN:  [id number]   Chief Complaint: Hypertension; Depression; Gastroesophageal Reflux; and Hyperlipidemia  Hypertension  This is a chronic problem. The current episode started more than 1 year ago. The problem has been gradually improving since onset. The problem is controlled. Pertinent negatives include no anxiety, blurred vision, chest pain, headaches, malaise/fatigue, neck pain, orthopnea, palpitations, peripheral edema, PND, shortness of breath or sweats. There are no associated agents to hypertension. There are no known risk factors for coronary artery disease. Past treatments include angiotensin blockers, calcium channel blockers and diuretics. The current treatment provides moderate improvement. There are no compliance problems.  There is no history of angina, kidney disease, CAD/MI, CVA, heart failure, left ventricular hypertrophy, PVD or retinopathy. There is no history of chronic renal disease, a hypertension causing med or renovascular disease.  Depression         Associated symptoms include no fatigue, no appetite change, no myalgias, no headaches and no suicidal ideas.   Pertinent negatives include no anxiety. Gastroesophageal Reflux  He reports no abdominal pain, no chest pain, no choking, no coughing, no nausea, no sore throat or no wheezing. Pertinent negatives include no fatigue.  Hyperlipidemia  He has no history of chronic renal disease. Pertinent negatives include no chest pain, myalgias or shortness of breath.    Review of Systems  Constitutional: Negative for appetite change, chills, fatigue, fever, malaise/fatigue and unexpected weight change.  HENT: Negative for drooling, ear discharge, ear pain, facial swelling, hearing loss, nosebleeds, sneezing, sore throat and trouble swallowing.   Eyes: Negative for blurred vision, photophobia, pain, discharge, redness, itching and visual disturbance.  Respiratory:  Negative for cough, choking, chest tightness, shortness of breath and wheezing.   Cardiovascular: Negative for chest pain, palpitations, orthopnea, leg swelling and PND.  Gastrointestinal: Negative for abdominal pain, blood in stool, constipation, diarrhea, nausea, rectal pain and vomiting.  Endocrine: Negative for cold intolerance, heat intolerance, polydipsia, polyphagia and polyuria.  Genitourinary: Negative for decreased urine volume, difficulty urinating, discharge, dysuria, flank pain, frequency, hematuria, penile pain, penile swelling, scrotal swelling, testicular pain and urgency.  Musculoskeletal: Negative for back pain, joint swelling, myalgias, neck pain and neck stiffness.  Skin: Negative for color change and rash.  Allergic/Immunologic: Negative for environmental allergies and immunocompromised state.  Neurological: Negative for dizziness, tremors, seizures, syncope, speech difficulty, weakness, light-headedness, numbness and headaches.  Hematological: Does not bruise/bleed easily.  Psychiatric/Behavioral: Positive for depression. Negative for agitation, behavioral problems, confusion, dysphoric mood, hallucinations, self-injury and suicidal ideas. The patient is not nervous/anxious.     Patient Active Problem List   Diagnosis Date Noted  . Chest pain 07/24/2016  . Type 2 diabetes mellitus without complication, without long-term current use of insulin ([place]) 07/24/2016  . Status post total hip replacement, left 01/25/2016  . Essential hypertension 07/21/2015  . Depression 07/21/2015  . Hyperlipidemia 07/21/2015  . GERD (gastroesophageal reflux disease) 12/04/2014  . Biceps tendinitis 07/21/2014  . Impingement syndrome of shoulder 07/21/2014  . Arthritis of knee, degenerative 08/14/2013  . Neuritis or radiculitis due to rupture of lumbar intervertebral disc 08/14/2013    No Known Allergies  Past Surgical History:  Procedure Laterality Date  . CHOLECYSTECTOMY    .  CHONDROPLASTY  10/05/09   knee- shaving patellofemoral joint  . COLONOSCOPY  2009?  . ENDOVENOUS ABLATION SAPHENOUS VEIN W/ LASER    . JOINT REPLACEMENT    . KNEE ARTHROSCOPY  Left 07/25/2017   Procedure: Arthroscopic partial medial meniscectomy; arthroscopic debridement; and arthroscopic abrasion chondroplasty of medial femoral condyle, lateral tibial plateau, and femoral trochlea; left knee.;  Surgeon: Corky Mull, MD;  Location: [place];  Service: Orthopedics;  Laterality: Left;  . KNEE ARTHROSCOPY WITH MEDIAL MENISECTOMY Right    Partial medial and lateral menisectomy  . SHOULDER ARTHROSCOPY Left 08/10/2014   Procedure: ARTHROSCOPY SHOULDER WITH LIMITED DEBRIDEMENT, REMOVAL OF LOOSE BODY AND RELEASE OF LONG END BICEPS TENDON;  Surgeon: Leanor Kail, MD;  Location: [place];  Service: Orthopedics;  Laterality: Left;  . TOTAL HIP ARTHROPLASTY Left 01/25/2016   Procedure: TOTAL HIP ARTHROPLASTY;  Surgeon: Corky Mull, MD;  Location: [facility];  Service: Orthopedics;  Laterality: Left;    Social History   Tobacco Use  . Smoking status: Never Smoker  . Smokeless tobacco: Never Used  Substance Use Topics  . Alcohol use: Yes    Comment: rare  . Drug use: No     Medication list has been reviewed and updated.  Current Meds  Medication Sig  . ACCU-CHEK AVIVA PLUS test strip TEST AS DIRECTED  . amLODipine (NORVASC) 10 MG tablet TAKE 1 TABLET BY MOUTH DAILY IN THE MORNING  . aspirin 81 MG tablet Take 1 tablet (81 mg total) by mouth daily. AM  . buPROPion (WELLBUTRIN) 75 MG tablet TAKE 1 TABLET BY MOUTH EVERY DAY  . Cholecalciferol 1000 UNITS capsule Take 1,000 Units by mouth daily. AM  . cyanocobalamin 1000 MCG tablet Take 1,000 mcg by mouth daily. AM   . empagliflozin (JARDIANCE) 25 MG TABS tablet Take by mouth.  . escitalopram (LEXAPRO) 20 MG tablet TAKE 1 TABLET BY MOUTH EVERY DAY  . esomeprazole (NEXIUM) 40 MG capsule TAKE 1 CAPSULE BY MOUTH EVERY DAY  .  gabapentin (NEURONTIN) 300 MG capsule TAKE 1 CAPSULE BY MOUTH THREE TIMES A DAY  . glucosamine-chondroitin 500-400 MG tablet Take 1 tablet by mouth daily.  . hydrochlorothiazide (HYDRODIURIL) 25 MG tablet TAKE 1 TABLET BY MOUTH DAILY IN THE MORNING  . losartan (COZAAR) 50 MG tablet Take 1 tablet (50 mg total) by mouth daily.  . meloxicam (MOBIC) 15 MG tablet Take 15 mg by mouth daily. [name]  . metFORMIN (GLUCOPHAGE) 500 MG tablet TAKE 1 TABLET BY MOUTH TWO  TIMES DAILY WITH MEALS  . omega-3 acid ethyl esters (LOVAZA) 1 g capsule Take 1 g by mouth 2 (two) times daily.  . simvastatin (ZOCOR) 40 MG tablet TAKE 1 TABLET BY MOUTH EVERYDAY AT BEDTIME    PHQ 2/9 Scores 02/04/2018 02/19/2017 07/21/2015  PHQ - 2 Score 0 0 0  PHQ- 9 Score 1 - -    Physical Exam Vitals signs and nursing note reviewed.  HENT:     Head: Normocephalic.     Right Ear: External ear normal.     Left Ear: External ear normal.     Nose: Nose normal.  Eyes:     General: No scleral icterus.       Right eye: No discharge.        Left eye: No discharge.     Conjunctiva/sclera: Conjunctivae normal.     Pupils: Pupils are equal, round, and reactive to light.  Neck:     Musculoskeletal: Normal range of motion and neck supple.     Thyroid: No thyromegaly.     Vascular: No JVD.     Trachea: No tracheal deviation.  Cardiovascular:     Rate and  Rhythm: Normal rate and regular rhythm.     Heart sounds: Normal heart sounds. No murmur. No friction rub. No gallop.   Pulmonary:     Effort: No respiratory distress.     Breath sounds: Normal breath sounds. No wheezing or rales.  Abdominal:     General: Bowel sounds are normal.     Palpations: Abdomen is soft. There is no mass.     Tenderness: There is no abdominal tenderness. There is no guarding or rebound.  Musculoskeletal: Normal range of motion.        General: No tenderness.  Lymphadenopathy:     Cervical: No cervical adenopathy.  Skin:    General: Skin is warm.       Findings: No rash.  Neurological:     Mental Status: He is alert and oriented to person, place, and time.     Cranial Nerves: No cranial nerve deficit.     Deep Tendon Reflexes: Reflexes are normal and symmetric.     BP 130/60   Pulse 72   Ht 6\' 1"  (1.854 m)   Wt 273 lb (123.8 kg)   BMI 36.02 kg/m   Assessment and Plan: 1. Essential hypertension Chronic, stable on meds- refill losartan, HCTZ and Amlodipine/ will obtain labs at next office visit - losartan (COZAAR) 50 MG tablet; Take 1 tablet (50 mg total) by mouth daily.  Dispense: 90 tablet; Refill: 1 - hydrochlorothiazide (HYDRODIURIL) 25 MG tablet; One a day  Dispense: 90 tablet; Refill: 1 - amLODipine (NORVASC) 10 MG tablet; One a day  Dispense: 90 tablet; Refill: 1  2. Mixed hyperlipidemia Chronic, stable on med- refill Simvastatin - simvastatin (ZOCOR) 40 MG tablet; TAKE 1 TABLET BY MOUTH EVERYDAY AT BEDTIME  Dispense: 90 tablet; Refill: 1  3. Other inflammatory polyneuropathies (HCC) Chronic- refill gabapentin and advised to call ortho for another evaluation. - gabapentin (NEURONTIN) 300 MG capsule; TAKE 1 CAPSULE BY MOUTH THREE TIMES A DAY  Dispense: 270 capsule; Refill: 1  4. Recurrent major depressive disorder, in partial remission (HCC) Chronic, stable on med- PHQ=1. Refill bupropion/ taper off Lexapro and recheck in office in 4 weeks - buPROPion (WELLBUTRIN) 75 MG tablet; Take 1 tablet (75 mg total) by mouth daily.  Dispense: 90 tablet; Refill: 1  5. Type 2 diabetes mellitus without complication, without long-term current use of insulin (HCC) Gave samples of Jardiance 10mg  for 7 days and 25mg  for 2 weeks. Put a call into Dr Joycie Peek office to see if patient can transfer to Dr Honor Junes or Warnell Forester in the [place] office. They will call the patient and notify of decision made.  Refill metformin/ get labs at endo appt - metFORMIN (GLUCOPHAGE) 500 MG tablet; TAKE 1 TABLET BY MOUTH TWO  TIMES DAILY WITH MEALS   Dispense: 180 tablet; Refill: 1

## 2018-02-04 NOTE — Patient Instructions (Signed)

## 2018-02-06 ENCOUNTER — Other Ambulatory Visit: Payer: Self-pay | Admitting: Orthopedic Surgery

## 2018-02-06 DIAGNOSIS — G8929 Other chronic pain: Secondary | ICD-10-CM

## 2018-02-06 DIAGNOSIS — M5441 Lumbago with sciatica, right side: Principal | ICD-10-CM

## 2018-02-14 ENCOUNTER — Ambulatory Visit
Admission: RE | Admit: 2018-02-14 | Discharge: 2018-02-14 | Disposition: A | Discharge status: Home / Self Care | Payer: Managed Care, Other (non HMO) | Source: Ambulatory Visit | Attending: Orthopedic Surgery | Admitting: Orthopedic Surgery

## 2018-02-14 DIAGNOSIS — M5441 Lumbago with sciatica, right side: Principal | ICD-10-CM

## 2018-02-14 DIAGNOSIS — G8929 Other chronic pain: Secondary | ICD-10-CM

## 2018-02-14 MED ORDER — METHYLPREDNISOLONE ACETATE 40 MG/ML INJ SUSP (RADIOLOG
120.0000 mg | Freq: Once | INTRAMUSCULAR | Status: AC
  Administered 2018-02-14: 120 mg via EPIDURAL

## 2018-02-14 MED ORDER — IOPAMIDOL (ISOVUE-M 200) INJECTION 41%
1.0000 mL | Freq: Once | INTRAMUSCULAR | Status: AC
  Administered 2018-02-14: 1 mL via EPIDURAL

## 2018-02-22 LAB — HM DIABETES EYE EXAM

## 2018-02-26 ENCOUNTER — Other Ambulatory Visit: Payer: Self-pay

## 2018-02-28 ENCOUNTER — Other Ambulatory Visit: Payer: Self-pay | Admitting: Family Medicine

## 2018-03-01 ENCOUNTER — Ambulatory Visit: Payer: Self-pay

## 2018-03-01 ENCOUNTER — Ambulatory Visit: Payer: Self-pay | Admitting: Emergency Medicine

## 2018-03-01 VITALS — BP 128/78 | HR 69 | Temp 97.9°F | Resp 14

## 2018-03-01 DIAGNOSIS — IMO0001 Reserved for inherently not codable concepts without codable children: Secondary | ICD-10-CM

## 2018-03-01 DIAGNOSIS — J029 Acute pharyngitis, unspecified: Secondary | ICD-10-CM

## 2018-03-01 DIAGNOSIS — J101 Influenza due to other identified influenza virus with other respiratory manifestations: Secondary | ICD-10-CM

## 2018-03-01 DIAGNOSIS — E1165 Type 2 diabetes mellitus with hyperglycemia: Secondary | ICD-10-CM

## 2018-03-01 LAB — GLUCOSE, POCT (MANUAL RESULT ENTRY): POC Glucose: 185 mg/dl — AB (ref 70–99)

## 2018-03-01 LAB — POCT RAPID STREP A (OFFICE): Rapid Strep A Screen: NEGATIVE

## 2018-03-01 LAB — POCT INFLUENZA A/B
Influenza A, POC: POSITIVE — AB
Influenza B, POC: NEGATIVE

## 2018-03-01 MED ORDER — OSELTAMIVIR PHOSPHATE 75 MG PO CAPS: 75.0000 mg | ORAL_CAPSULE | Freq: Two times a day (BID) | ORAL | 0 refills | Status: DC

## 2018-03-01 MED ORDER — BENZONATATE 100 MG PO CAPS: 100.0000 mg | ORAL_CAPSULE | Freq: Three times a day (TID) | ORAL | 0 refills | Status: DC | PRN

## 2018-03-01 NOTE — Patient Instructions (Signed)
Please rest drink plenty of fluids. If you developing worsening symptoms such as chest pain or shortness of breath or a significant productive cough please present to the emergency room for further evaluation.  Influenza, Adult Influenza is also called "the flu." It is an infection in the lungs, nose, and throat (respiratory tract). It is caused by a virus. The flu causes symptoms that are similar to symptoms of a cold. It also causes a high fever and body aches. The flu spreads easily from person to person (is contagious). Getting a flu shot (influenza vaccination) every year is the best way to prevent the flu. What are the causes? This condition is caused by the influenza virus. You can get the virus by:  Breathing in droplets that are in the air from the cough or sneeze of a person who has the virus.  Touching something that has the virus on it (is contaminated) and then touching your mouth, nose, or eyes. What increases the risk? Certain things may make you more likely to get the flu. These include:  Not washing your hands often.  Having close contact with many people during cold and flu season.  Touching your mouth, eyes, or nose without first washing your hands.  Not getting a flu shot every year. You may have a higher risk for the flu, along with serious problems such as a lung infection (pneumonia), if you:  Are older than 65.  Are pregnant.  Have a weakened disease-fighting system (immune system) because of a disease or taking certain medicines.  Have a long-term (chronic) illness, such as: ? Heart, kidney, or lung disease. ? Diabetes. ? Asthma.  Have a liver disorder.  Are very overweight (morbidly obese).  Have anemia. This is a condition that affects your red blood cells. What are the signs or symptoms? Symptoms usually begin suddenly and last 4-14 days. They may include:  Fever and chills.  Headaches, body aches, or muscle aches.  Sore  throat.  Cough.  Runny or stuffy (congested) nose.  Chest discomfort.  Not wanting to eat as much as normal (poor appetite).  Weakness or feeling tired (fatigue).  Dizziness.  Feeling sick to your stomach (nauseous) or throwing up (vomiting). How is this treated? If the flu is found early, you can be treated with medicine that can help reduce how bad the illness is and how long it lasts (antiviral medicine). This may be given by mouth (orally) or through an IV tube. Taking care of yourself at home can help your symptoms get better. Your doctor may suggest:  Taking over-the-counter medicines.  Drinking plenty of fluids. The flu often goes away on its own. If you have very bad symptoms or other problems, you may be treated in a hospital. Follow these instructions at home:     Activity  Rest as needed. Get plenty of sleep.  Stay home from work or school as told by your doctor. ? Do not leave home until you do not have a fever for 24 hours without taking medicine. ? Leave home only to visit your doctor. Eating and drinking  Take an ORS (oral rehydration solution). This is a drink that is sold at pharmacies and stores.  Drink enough fluid to keep your pee (urine) pale yellow.  Drink clear fluids in small amounts as you are able. Clear fluids include: ? Water. ? Ice chips. ? Fruit juice that has water added (diluted fruit juice). ? Low-calorie sports drinks.  Eat bland, easy-to-digest foods in  small amounts as you are able. These foods include: ? Bananas. ? Applesauce. ? Rice. ? Lean meats. ? Toast. ? Crackers.  Do not eat or drink: ? Fluids that have a lot of sugar or caffeine. ? Alcohol. ? Spicy or fatty foods. General instructions  Take over-the-counter and prescription medicines only as told by your doctor.  Use a cool mist humidifier to add moisture to the air in your home. This can make it easier for you to breathe.  Cover your mouth and nose when you  cough or sneeze.  Wash your hands with soap and water often, especially after you cough or sneeze. If you cannot use soap and water, use alcohol-based hand sanitizer.  Keep all follow-up visits as told by your doctor. This is important. How is this prevented?   Get a flu shot every year. You may get the flu shot in late summer, fall, or winter. Ask your doctor when you should get your flu shot.  Avoid contact with people who are sick during fall and winter (cold and flu season). Contact a doctor if:  You get new symptoms.  You have: ? Chest pain. ? Watery poop (diarrhea). ? A fever.  Your cough gets worse.  You start to have more mucus.  You feel sick to your stomach.  You throw up. Get help right away if you:  Have shortness of breath.  Have trouble breathing.  Have skin or nails that turn a bluish color.  Have very bad pain or stiffness in your neck.  Get a sudden headache.  Get sudden pain in your face or ear.  Cannot eat or drink without throwing up. Summary  Influenza ("the flu") is an infection in the lungs, nose, and throat. It is caused by a virus.  Take over-the-counter and prescription medicines only as told by your doctor.  Getting a flu shot every year is the best way to avoid getting the flu. This information is not intended to replace advice given to you by your health care provider. Make sure you discuss any questions you have with your health care provider. Document Released: 11/16/2007 Document Revised: 07/25/2017 Document Reviewed: 07/25/2017 Elsevier Interactive Patient Education  2019 [employer].

## 2018-03-01 NOTE — Progress Notes (Signed)
Subjective: Patient had the onset of symptoms Wednesday morning.  He just started to feel bad.  He has had some clammy episodes.  He has had a headache, sore throat ,popping in his ears, and a nonproductive cough.  He has had mild aching but has a history of neuropathy related to his diabetes and is unsure if it is true muscle aching related to his illness.  He has tried some DayQuil. Past medical history: History of diabetes History of osteoarthritis and low back problems. Review of systems: No chest pain or shortness of breath. No leg swelling or edema. No rash. Objective: Today's Vitals   03/01/18 0916  BP: 128/78  Pulse: 69  Resp: 14  Temp: 97.9 F (36.6 C)  TempSrc: Oral  SpO2: 98%   There is no height or weight on file to calculate BMI.  Alert and cooperative in no distress. Conjunctive are clear. TMs clear. Throat normal. Chest clear to auscultation and percussion. Heart regular rate and rhythm. Flu test positive influenza A Strep negative. Glucose 185 Assessment: Patient tested positive for influenza a.  He is within the 48-hour window.  His wife is also ill and she will contact her doctor.  Will treat with Tamiflu and Tessalon Perles.  I discussed with patient red flag warnings regarding his influenza. Plan: Tamiflu 75 twice daily for 5 days. Tessalon Perles for cough. Check blood sugar.

## 2018-03-02 LAB — STREP A DNA PROBE: Strep Gp A Direct, DNA Probe: NEGATIVE

## 2018-03-04 ENCOUNTER — Ambulatory Visit: Payer: Managed Care, Other (non HMO) | Admitting: Family Medicine

## 2018-03-04 ENCOUNTER — Encounter: Payer: Self-pay | Admitting: Family Medicine

## 2018-03-04 VITALS — BP 120/64 | HR 64 | Ht 73.0 in | Wt 271.0 lb

## 2018-03-04 DIAGNOSIS — Z8659 Personal history of other mental and behavioral disorders: Secondary | ICD-10-CM

## 2018-03-04 DIAGNOSIS — F419 Anxiety disorder, unspecified: Secondary | ICD-10-CM | POA: Diagnosis not present

## 2018-03-04 DIAGNOSIS — F3341 Major depressive disorder, recurrent, in partial remission: Secondary | ICD-10-CM | POA: Diagnosis not present

## 2018-03-04 MED ORDER — SERTRALINE HCL 50 MG PO TABS: 50.0000 mg | ORAL_TABLET | Freq: Every day | ORAL | 3 refills | Status: DC

## 2018-03-04 MED ORDER — BUPROPION HCL 75 MG PO TABS: 75.0000 mg | ORAL_TABLET | Freq: Two times a day (BID) | ORAL | 1 refills | Status: DC

## 2018-03-04 NOTE — Progress Notes (Signed)
Date:  03/04/2018   Name:  Raymond [name]   DOB:  1955/03/21   MRN:  [id number]   Chief Complaint: Depression (change med to something different- PHQ9=1)  Depression         This is a chronic problem.  The current episode started more than 1 year ago.   The onset quality is gradual.   The problem occurs every several days.The problem is unchanged.  Associated symptoms include hopelessness, irritable, decreased interest and headaches.  Associated symptoms include no decreased concentration, no fatigue, no helplessness, does not have insomnia, no restlessness, no appetite change, no body aches, no myalgias, no indigestion, not sad and no suicidal ideas.  Past treatments include SSRIs - Selective serotonin reuptake inhibitors and other medications (welbutrin).  Compliance with treatment is good.  Previous treatment provided mild relief.   Review of Systems  Constitutional: Negative for appetite change, chills, fatigue and fever.  HENT: Negative for drooling, ear discharge, ear pain and sore throat.   Respiratory: Negative for cough, shortness of breath and wheezing.   Cardiovascular: Negative for chest pain, palpitations and leg swelling.  Gastrointestinal: Negative for abdominal pain, blood in stool, constipation, diarrhea and nausea.  Endocrine: Negative for polydipsia.  Genitourinary: Negative for dysuria, frequency, hematuria and urgency.  Musculoskeletal: Negative for back pain, myalgias and neck pain.  Skin: Negative for rash.  Allergic/Immunologic: Negative for environmental allergies.  Neurological: Positive for headaches. Negative for dizziness.  Hematological: Does not bruise/bleed easily.  Psychiatric/Behavioral: Positive for depression. Negative for agitation, behavioral problems, confusion, decreased concentration, dysphoric mood, hallucinations, self-injury, sleep disturbance and suicidal ideas. The patient is not nervous/anxious, does not have insomnia and is not hyperactive.       Patient Active Problem List   Diagnosis Date Noted  . Strain of left knee 08/06/2017  . Obesity (BMI 35.0-39.9 without comorbidity) 08/03/2017  . Status post arthroscopy of left knee 08/03/2017  . Complex tear of medial meniscus of left knee as current injury 07/26/2017  . Chest pain 07/24/2016  . Uncontrolled type 2 diabetes mellitus without complication, without long-term current use of insulin ([place]) 07/24/2016  . Status post total hip replacement, left 01/25/2016  . Benign essential HTN 07/21/2015  . Depression 07/21/2015  . Hyperlipidemia, mixed 07/21/2015  . GERD (gastroesophageal reflux disease) 12/04/2014  . Biceps tendinitis 07/21/2014  . Impingement syndrome of shoulder 07/21/2014  . Primary osteoarthritis of right knee 08/14/2013  . Neuritis or radiculitis due to rupture of lumbar intervertebral disc 08/14/2013    No Known Allergies  Past Surgical History:  Procedure Laterality Date  . CHOLECYSTECTOMY    . CHONDROPLASTY  10/05/09   knee- shaving patellofemoral joint  . COLONOSCOPY  2009?  . ENDOVENOUS ABLATION SAPHENOUS VEIN W/ LASER    . JOINT REPLACEMENT    . KNEE ARTHROSCOPY Left 07/25/2017   Procedure: Arthroscopic partial medial meniscectomy; arthroscopic debridement; and arthroscopic abrasion chondroplasty of medial femoral condyle, lateral tibial plateau, and femoral trochlea; left knee.;  Surgeon: Corky Mull, MD;  Location: [place];  Service: Orthopedics;  Laterality: Left;  . KNEE ARTHROSCOPY WITH MEDIAL MENISECTOMY Right    Partial medial and lateral menisectomy  . SHOULDER ARTHROSCOPY Left 08/10/2014   Procedure: ARTHROSCOPY SHOULDER WITH LIMITED DEBRIDEMENT, REMOVAL OF LOOSE BODY AND RELEASE OF LONG END BICEPS TENDON;  Surgeon: Leanor Kail, MD;  Location: [place];  Service: Orthopedics;  Laterality: Left;  . TOTAL HIP ARTHROPLASTY Left 01/25/2016   Procedure: TOTAL HIP ARTHROPLASTY;  Surgeon: Corky Mull, MD;  Location: [facility];  Service: Orthopedics;  Laterality: Left;    Social History   Tobacco Use  . Smoking status: Never Smoker  . Smokeless tobacco: Never Used  Substance Use Topics  . Alcohol use: Yes    Comment: rare  . Drug use: No     Medication list has been reviewed and updated.  Current Meds  Medication Sig  . ACCU-CHEK AVIVA PLUS test strip TEST AS DIRECTED  . amLODipine (NORVASC) 10 MG tablet One a day  . aspirin 81 MG tablet Take 1 tablet (81 mg total) by mouth daily. AM  . buPROPion (WELLBUTRIN) 75 MG tablet Take 1 tablet (75 mg total) by mouth daily.  . Cholecalciferol 1000 UNITS capsule Take 1,000 Units by mouth daily. AM  . cyanocobalamin 1000 MCG tablet Take 1,000 mcg by mouth daily. AM   . empagliflozin (JARDIANCE) 25 MG TABS tablet Take 25 mg by mouth daily.  Marland Kitchen escitalopram (LEXAPRO) 10 MG tablet TAKE 1 TABLET BY MOUTH EVERY DAY  . esomeprazole (NEXIUM) 40 MG capsule TAKE 1 CAPSULE BY MOUTH EVERY DAY  . gabapentin (NEURONTIN) 300 MG capsule TAKE 1 CAPSULE BY MOUTH THREE TIMES A DAY  . glucosamine-chondroitin 500-400 MG tablet Take 1 tablet by mouth daily.  . hydrochlorothiazide (HYDRODIURIL) 25 MG tablet One a day  . losartan (COZAAR) 50 MG tablet Take 1 tablet (50 mg total) by mouth daily.  . metFORMIN (GLUCOPHAGE) 500 MG tablet TAKE 1 TABLET BY MOUTH TWO  TIMES DAILY WITH MEALS  . omega-3 acid ethyl esters (LOVAZA) 1 g capsule Take 1 g by mouth 2 (two) times daily.  . simvastatin (ZOCOR) 40 MG tablet TAKE 1 TABLET BY MOUTH EVERYDAY AT BEDTIME    PHQ 2/9 Scores 03/04/2018 02/04/2018 02/19/2017 07/21/2015  PHQ - 2 Score 0 0 0 0  PHQ- 9 Score 1 1 - -    Physical Exam Vitals signs and nursing note reviewed.  Constitutional:      General: He is irritable.  HENT:     Head: Normocephalic.     Right Ear: External ear normal.     Left Ear: External ear normal.     Nose: Nose normal.  Eyes:     General: No scleral icterus.       Right eye: No discharge.        Left eye:  No discharge.     Conjunctiva/sclera: Conjunctivae normal.     Pupils: Pupils are equal, round, and reactive to light.  Neck:     Musculoskeletal: Normal range of motion and neck supple.     Thyroid: No thyromegaly.     Vascular: No JVD.     Trachea: No tracheal deviation.  Cardiovascular:     Rate and Rhythm: Normal rate and regular rhythm.     Heart sounds: Normal heart sounds. No murmur. No friction rub. No gallop.   Pulmonary:     Effort: No respiratory distress.     Breath sounds: Normal breath sounds. No wheezing or rales.  Abdominal:     General: Bowel sounds are normal.     Palpations: Abdomen is soft. There is no mass.     Tenderness: There is no abdominal tenderness. There is no guarding or rebound.  Musculoskeletal: Normal range of motion.        General: No tenderness.  Lymphadenopathy:     Cervical: No cervical adenopathy.  Skin:    General: Skin is warm.  Findings: No rash.  Neurological:     Mental Status: He is alert and oriented to person, place, and time.     Cranial Nerves: No cranial nerve deficit.     Deep Tendon Reflexes: Reflexes are normal and symmetric.     BP 120/64   Pulse 64   Ht 6\' 1"  (1.854 m)   Wt 271 lb (122.9 kg)   BMI 35.75 kg/m   Assessment and Plan: 1. Recurrent major depressive disorder, in partial remission ([place]) And is currently decreasing his citalopram dosing.  Wellbutrin 75 mg once a day but will increase to the twice a day dosing.  In addition after a tapering off of escitalopram for 3 days will initiate journaling because of its specifics for PTSD depression and anxiety.  Again at 50 mg.  Referral to [facility] was made for evaluation and treatment. - buPROPion (WELLBUTRIN) 75 MG tablet; Take 1 tablet (75 mg total) by mouth 2 (two) times daily.  Dispense: 69 tablet; Refill: 1 - sertraline (ZOLOFT) 50 MG tablet; Take 1 tablet (50 mg total) by mouth daily.  Dispense: 30 tablet; Refill: 3 - Ambulatory referral to Psychiatry  2.  History of posttraumatic stress disorder (PTSD) Patient had a evaluation psychotherapy which it was suggested that he had PTSD.  Was previously on sertraline however this specifically can be used for PTSD and I would like to resume this and maybe push the dosing.  Meantime we will be obtaining a referral to [facility] and they may make a change on this. - sertraline (ZOLOFT) 50 MG tablet; Take 1 tablet (50 mg total) by mouth daily.  Dispense: 30 tablet; Refill: 3 - Ambulatory referral to Psychiatry  3. Anxiety Patient has a history of anxiety and will reinitiate sertraline. - sertraline (ZOLOFT) 50 MG tablet; Take 1 tablet (50 mg total) by mouth daily.  Dispense: 30 tablet; Refill: 3 - Ambulatory referral to Psychiatry

## 2018-03-26 ENCOUNTER — Other Ambulatory Visit: Payer: Self-pay | Admitting: Family Medicine

## 2018-03-26 DIAGNOSIS — F419 Anxiety disorder, unspecified: Secondary | ICD-10-CM

## 2018-03-26 DIAGNOSIS — F3341 Major depressive disorder, recurrent, in partial remission: Secondary | ICD-10-CM

## 2018-03-26 DIAGNOSIS — Z8659 Personal history of other mental and behavioral disorders: Secondary | ICD-10-CM

## 2018-04-03 ENCOUNTER — Ambulatory Visit: Payer: Managed Care, Other (non HMO) | Admitting: Family Medicine

## 2018-04-09 ENCOUNTER — Ambulatory Visit (INDEPENDENT_AMBULATORY_CARE_PROVIDER_SITE_OTHER): Payer: Managed Care, Other (non HMO) | Admitting: Family Medicine

## 2018-04-09 ENCOUNTER — Encounter: Payer: Self-pay | Admitting: Family Medicine

## 2018-04-09 VITALS — BP 138/72 | HR 72 | Ht 73.0 in | Wt 280.0 lb

## 2018-04-09 DIAGNOSIS — F3341 Major depressive disorder, recurrent, in partial remission: Secondary | ICD-10-CM | POA: Diagnosis not present

## 2018-04-09 DIAGNOSIS — E119 Type 2 diabetes mellitus without complications: Secondary | ICD-10-CM | POA: Diagnosis not present

## 2018-04-09 MED ORDER — BUPROPION HCL 75 MG PO TABS: 75.0000 mg | ORAL_TABLET | Freq: Two times a day (BID) | ORAL | 1 refills | Status: DC

## 2018-04-09 MED ORDER — GLIPIZIDE 5 MG PO TABS: 5.0000 mg | ORAL_TABLET | Freq: Two times a day (BID) | ORAL | 1 refills | Status: DC

## 2018-04-09 MED ORDER — METFORMIN HCL 500 MG PO TABS: ORAL_TABLET | ORAL | 1 refills | Status: DC

## 2018-04-09 MED ORDER — EMPAGLIFLOZIN 25 MG PO TABS: 25.0000 mg | ORAL_TABLET | Freq: Every day | ORAL | 5 refills | Status: DC

## 2018-04-09 NOTE — Progress Notes (Signed)
Date:  04/09/2018   Name:  Raymond [name]   DOB:  [birth year]   MRN:  [id number]   Chief Complaint: Depression (follow up) and Diabetes (recheck BS due to Jardiance is not being covered.)  Depression         This is a chronic problem.  The current episode started more than 1 year ago.   The onset quality is gradual.   The problem occurs daily.  The problem has been gradually improving since onset.  Associated symptoms include no decreased concentration, no fatigue, no helplessness, no hopelessness, does not have insomnia, not irritable, no restlessness, no decreased interest, no appetite change, no body aches, no myalgias, no headaches, no indigestion, not sad and no suicidal ideas.     The symptoms are aggravated by nothing.  Past treatments include SSRIs - Selective serotonin reuptake inhibitors.  Compliance with treatment is variable.  Past compliance problems include insurance issues.  Previous treatment provided moderate relief. Diabetes  He presents for his follow-up diabetic visit. He has type 2 diabetes mellitus. His disease course has been improving. There are no hypoglycemic associated symptoms. Pertinent negatives for hypoglycemia include no dizziness, headaches or nervousness/anxiousness. There are no diabetic associated symptoms. Pertinent negatives for diabetes include no chest pain, no fatigue and no polydipsia. There are no hypoglycemic complications. Symptoms are stable. There are no diabetic complications. Risk factors for coronary artery disease include diabetes mellitus and dyslipidemia. Current diabetic treatment includes oral agent (dual therapy). He is compliant with treatment most of the time. His weight is stable. He is following a generally healthy diet. He participates in exercise intermittently. There is no change in his home blood glucose trend. His breakfast blood glucose is taken between 8-9 am. His breakfast blood glucose range is generally 140-180 mg/dl.    Review of  Systems  Constitutional: Negative for appetite change, chills, fatigue and fever.  HENT: Negative for drooling, ear discharge, ear pain and sore throat.   Respiratory: Negative for cough, shortness of breath and wheezing.   Cardiovascular: Negative for chest pain, palpitations and leg swelling.  Gastrointestinal: Negative for abdominal pain, blood in stool, constipation, diarrhea and nausea.  Endocrine: Negative for polydipsia.  Genitourinary: Negative for dysuria, frequency, hematuria and urgency.  Musculoskeletal: Negative for back pain, myalgias and neck pain.  Skin: Negative for rash.  Allergic/Immunologic: Negative for environmental allergies.  Neurological: Negative for dizziness and headaches.  Hematological: Does not bruise/bleed easily.  Psychiatric/Behavioral: Positive for depression. Negative for decreased concentration and suicidal ideas. The patient is not nervous/anxious and does not have insomnia.     Patient Active Problem List   Diagnosis Date Noted  . Strain of left knee 08/06/2017  . Obesity (BMI 35.0-39.9 without comorbidity) 08/03/2017  . Status post arthroscopy of left knee 08/03/2017  . Complex tear of medial meniscus of left knee as current injury 07/26/2017  . Chest pain 07/24/2016  . Uncontrolled type 2 diabetes mellitus without complication, without long-term current use of insulin ([place]) 07/24/2016  . Status post total hip replacement, left 01/25/2016  . Benign essential HTN 07/21/2015  . Depression 07/21/2015  . Hyperlipidemia, mixed 07/21/2015  . GERD (gastroesophageal reflux disease) 12/04/2014  . Biceps tendinitis 07/21/2014  . Impingement syndrome of shoulder 07/21/2014  . Primary osteoarthritis of right knee 08/14/2013  . Neuritis or radiculitis due to rupture of lumbar intervertebral disc 08/14/2013    No Known Allergies  Past Surgical History:  Procedure Laterality Date  . CHOLECYSTECTOMY    .  CHONDROPLASTY  10/05/09   knee- shaving  patellofemoral joint  . COLONOSCOPY  2009?  . ENDOVENOUS ABLATION SAPHENOUS VEIN W/ LASER    . JOINT REPLACEMENT    . KNEE ARTHROSCOPY Left 07/25/2017   Procedure: Arthroscopic partial medial meniscectomy; arthroscopic debridement; and arthroscopic abrasion chondroplasty of medial femoral condyle, lateral tibial plateau, and femoral trochlea; left knee.;  Surgeon: Corky Mull, MD;  Location: [place];  Service: Orthopedics;  Laterality: Left;  . KNEE ARTHROSCOPY WITH MEDIAL MENISECTOMY Right    Partial medial and lateral menisectomy  . SHOULDER ARTHROSCOPY Left 08/10/2014   Procedure: ARTHROSCOPY SHOULDER WITH LIMITED DEBRIDEMENT, REMOVAL OF LOOSE BODY AND RELEASE OF LONG END BICEPS TENDON;  Surgeon: Leanor Kail, MD;  Location: [place];  Service: Orthopedics;  Laterality: Left;  . TOTAL HIP ARTHROPLASTY Left 01/25/2016   Procedure: TOTAL HIP ARTHROPLASTY;  Surgeon: Corky Mull, MD;  Location: [facility];  Service: Orthopedics;  Laterality: Left;    Social History   Tobacco Use  . Smoking status: Never Smoker  . Smokeless tobacco: Never Used  Substance Use Topics  . Alcohol use: Yes    Comment: rare  . Drug use: No     Medication list has been reviewed and updated.  Current Meds  Medication Sig  . ACCU-CHEK AVIVA PLUS test strip TEST AS DIRECTED  . amLODipine (NORVASC) 10 MG tablet One a day  . aspirin 81 MG tablet Take 1 tablet (81 mg total) by mouth daily. AM  . buPROPion (WELLBUTRIN) 75 MG tablet Take 1 tablet (75 mg total) by mouth 2 (two) times daily.  . Cholecalciferol 1000 UNITS capsule Take 1,000 Units by mouth daily. AM  . cyanocobalamin 1000 MCG tablet Take 1,000 mcg by mouth daily. AM   . esomeprazole (NEXIUM) 40 MG capsule TAKE 1 CAPSULE BY MOUTH EVERY DAY (Patient taking differently: TAKE 1 CAPSULE BY MOUTH EVERY DAY/ otc)  . fluticasone (FLOVENT HFA) 110 MCG/ACT inhaler Inhale into the lungs.  . gabapentin (NEURONTIN) 300 MG capsule TAKE 1  CAPSULE BY MOUTH THREE TIMES A DAY  . glucosamine-chondroitin 500-400 MG tablet Take 1 tablet by mouth daily.  . hydrochlorothiazide (HYDRODIURIL) 25 MG tablet One a day  . losartan (COZAAR) 50 MG tablet Take 1 tablet (50 mg total) by mouth daily.  . meloxicam (MOBIC) 15 MG tablet Take 15 mg by mouth daily. [name]  . metFORMIN (GLUCOPHAGE) 500 MG tablet TAKE 1 TABLET BY MOUTH TWO  TIMES DAILY WITH MEALS  . omega-3 acid ethyl esters (LOVAZA) 1 g capsule Take 1 g by mouth 2 (two) times daily.  . sertraline (ZOLOFT) 50 MG tablet TAKE 1 TABLET BY MOUTH EVERY DAY  . simvastatin (ZOCOR) 40 MG tablet TAKE 1 TABLET BY MOUTH EVERYDAY AT BEDTIME    PHQ 2/9 Scores 04/09/2018 03/04/2018 02/04/2018 02/19/2017  PHQ - 2 Score 0 0 0 0  PHQ- 9 Score 0 1 1 -    Physical Exam Vitals signs and nursing note reviewed.  Constitutional:      General: He is not irritable.    Appearance: He is obese.  HENT:     Head: Normocephalic.     Right Ear: External ear normal.     Left Ear: External ear normal.     Nose: Nose normal. No congestion.  Eyes:     General: No scleral icterus.       Right eye: No discharge.        Left eye: No discharge.  Conjunctiva/sclera: Conjunctivae normal.     Pupils: Pupils are equal, round, and reactive to light.  Neck:     Musculoskeletal: Normal range of motion and neck supple.     Thyroid: No thyromegaly.     Vascular: No JVD.     Trachea: No tracheal deviation.  Cardiovascular:     Rate and Rhythm: Normal rate and regular rhythm.     Heart sounds: Normal heart sounds. No murmur. No friction rub. No gallop.   Pulmonary:     Effort: No respiratory distress.     Breath sounds: Normal breath sounds. No wheezing or rales.  Abdominal:     General: Bowel sounds are normal.     Palpations: Abdomen is soft. There is no mass.     Tenderness: There is no abdominal tenderness. There is no guarding or rebound.  Musculoskeletal: Normal range of motion.        General: No  tenderness.  Lymphadenopathy:     Cervical: No cervical adenopathy.  Skin:    General: Skin is warm.     Findings: No rash.  Neurological:     Mental Status: He is alert and oriented to person, place, and time.     Cranial Nerves: No cranial nerve deficit.     Deep Tendon Reflexes: Reflexes are normal and symmetric.     BP 138/72   Pulse 72   Ht 6\' 1"  (1.854 m)   Wt 280 lb (127 kg)   BMI 36.94 kg/m   Assessment and Plan: 1. Type 2 diabetes mellitus without complication, without long-term current use of insulin (HCC) Chronic. Patient unable to afford jardiance. Will start glipizide and provide him with a written prescription and coupon card for jardiance to shop around. Will continue metformin - glipiZIDE (GLUCOTROL) 5 MG tablet; Take 1 tablet (5 mg total) by mouth 2 (two) times daily before a meal.  Dispense: 180 tablet; Refill: 1 - metFORMIN (GLUCOPHAGE) 500 MG tablet; TAKE 1 TABLET BY MOUTH TWO  TIMES DAILY WITH MEALS  Dispense: 180 tablet; Refill: 1 - empagliflozin (JARDIANCE) 25 MG TABS tablet; Take 25 mg by mouth daily.  Dispense: 30 tablet; Refill: 5  2. Recurrent major depressive disorder, in partial remission (HCC) Chronic. Controlled on med. Stable on this dosing. Refill bupropion  - buPROPion (WELLBUTRIN) 75 MG tablet; Take 1 tablet (75 mg total) by mouth 2 (two) times daily.  Dispense: 180 tablet; Refill: 1

## 2018-06-06 ENCOUNTER — Ambulatory Visit: Payer: Managed Care, Other (non HMO) | Admitting: Family Medicine

## 2018-08-09 DIAGNOSIS — G5603 Carpal tunnel syndrome, bilateral upper limbs: Secondary | ICD-10-CM | POA: Diagnosis not present

## 2018-08-09 DIAGNOSIS — M7582 Other shoulder lesions, left shoulder: Secondary | ICD-10-CM | POA: Insufficient documentation

## 2018-08-09 DIAGNOSIS — G5601 Carpal tunnel syndrome, right upper limb: Secondary | ICD-10-CM | POA: Insufficient documentation

## 2018-08-09 DIAGNOSIS — M19012 Primary osteoarthritis, left shoulder: Secondary | ICD-10-CM | POA: Diagnosis not present

## 2018-08-09 DIAGNOSIS — M25512 Pain in left shoulder: Secondary | ICD-10-CM | POA: Diagnosis not present

## 2018-08-11 ENCOUNTER — Other Ambulatory Visit: Payer: Self-pay | Admitting: Family Medicine

## 2018-08-11 DIAGNOSIS — G6189 Other inflammatory polyneuropathies: Secondary | ICD-10-CM

## 2018-08-11 DIAGNOSIS — I10 Essential (primary) hypertension: Secondary | ICD-10-CM

## 2018-08-19 ENCOUNTER — Other Ambulatory Visit: Payer: Self-pay | Admitting: Family Medicine

## 2018-08-19 DIAGNOSIS — I10 Essential (primary) hypertension: Secondary | ICD-10-CM

## 2018-10-11 ENCOUNTER — Other Ambulatory Visit: Payer: Self-pay | Admitting: Family Medicine

## 2018-10-11 DIAGNOSIS — F3341 Major depressive disorder, recurrent, in partial remission: Secondary | ICD-10-CM

## 2018-10-11 DIAGNOSIS — Z8659 Personal history of other mental and behavioral disorders: Secondary | ICD-10-CM

## 2018-10-11 DIAGNOSIS — E119 Type 2 diabetes mellitus without complications: Secondary | ICD-10-CM

## 2018-10-11 DIAGNOSIS — F419 Anxiety disorder, unspecified: Secondary | ICD-10-CM

## 2018-10-11 DIAGNOSIS — E782 Mixed hyperlipidemia: Secondary | ICD-10-CM

## 2018-11-04 ENCOUNTER — Other Ambulatory Visit: Payer: Self-pay | Admitting: Family Medicine

## 2018-11-04 DIAGNOSIS — I10 Essential (primary) hypertension: Secondary | ICD-10-CM

## 2018-11-05 ENCOUNTER — Other Ambulatory Visit: Payer: Self-pay | Admitting: Family Medicine

## 2018-11-05 DIAGNOSIS — E119 Type 2 diabetes mellitus without complications: Secondary | ICD-10-CM

## 2018-11-05 DIAGNOSIS — E782 Mixed hyperlipidemia: Secondary | ICD-10-CM

## 2018-11-07 ENCOUNTER — Other Ambulatory Visit: Payer: Self-pay

## 2018-11-07 ENCOUNTER — Ambulatory Visit (INDEPENDENT_AMBULATORY_CARE_PROVIDER_SITE_OTHER): Payer: Self-pay | Admitting: Family Medicine

## 2018-11-07 ENCOUNTER — Encounter: Payer: Self-pay | Admitting: Family Medicine

## 2018-11-07 VITALS — BP 120/76 | HR 72 | Ht 73.0 in | Wt 269.0 lb

## 2018-11-07 DIAGNOSIS — F3341 Major depressive disorder, recurrent, in partial remission: Secondary | ICD-10-CM

## 2018-11-07 DIAGNOSIS — I10 Essential (primary) hypertension: Secondary | ICD-10-CM | POA: Diagnosis not present

## 2018-11-07 DIAGNOSIS — F419 Anxiety disorder, unspecified: Secondary | ICD-10-CM

## 2018-11-07 DIAGNOSIS — E119 Type 2 diabetes mellitus without complications: Secondary | ICD-10-CM | POA: Diagnosis not present

## 2018-11-07 DIAGNOSIS — G6189 Other inflammatory polyneuropathies: Secondary | ICD-10-CM

## 2018-11-07 DIAGNOSIS — E782 Mixed hyperlipidemia: Secondary | ICD-10-CM

## 2018-11-07 DIAGNOSIS — Z23 Encounter for immunization: Secondary | ICD-10-CM

## 2018-11-07 DIAGNOSIS — R69 Illness, unspecified: Secondary | ICD-10-CM

## 2018-11-07 DIAGNOSIS — Z8659 Personal history of other mental and behavioral disorders: Secondary | ICD-10-CM

## 2018-11-07 DIAGNOSIS — R5383 Other fatigue: Secondary | ICD-10-CM

## 2018-11-07 DIAGNOSIS — R634 Abnormal weight loss: Secondary | ICD-10-CM

## 2018-11-07 MED ORDER — JARDIANCE 25 MG PO TABS: 25.0000 mg | ORAL_TABLET | Freq: Every day | ORAL | 1 refills | Status: DC

## 2018-11-07 MED ORDER — GABAPENTIN 300 MG PO CAPS: ORAL_CAPSULE | ORAL | 1 refills | Status: DC

## 2018-11-07 MED ORDER — SIMVASTATIN 40 MG PO TABS: ORAL_TABLET | ORAL | 1 refills | Status: DC

## 2018-11-07 MED ORDER — GLIPIZIDE 5 MG PO TABS: ORAL_TABLET | ORAL | 1 refills | Status: DC

## 2018-11-07 MED ORDER — LOSARTAN POTASSIUM 100 MG PO TABS: 50.0000 mg | ORAL_TABLET | Freq: Every day | ORAL | 1 refills | Status: DC

## 2018-11-07 MED ORDER — AMLODIPINE BESYLATE 10 MG PO TABS: 10.0000 mg | ORAL_TABLET | Freq: Every day | ORAL | 1 refills | Status: DC

## 2018-11-07 MED ORDER — HYDROCHLOROTHIAZIDE 25 MG PO TABS: ORAL_TABLET | ORAL | 1 refills | Status: DC

## 2018-11-07 MED ORDER — METFORMIN HCL 500 MG PO TABS: ORAL_TABLET | ORAL | 1 refills | Status: DC

## 2018-11-07 MED ORDER — SERTRALINE HCL 50 MG PO TABS: 50.0000 mg | ORAL_TABLET | Freq: Every day | ORAL | 1 refills | Status: DC

## 2018-11-07 MED ORDER — BUPROPION HCL 75 MG PO TABS: 75.0000 mg | ORAL_TABLET | Freq: Two times a day (BID) | ORAL | 1 refills | Status: DC

## 2018-11-07 NOTE — Patient Instructions (Signed)
Mediterranean Diet A Mediterranean diet refers to food and lifestyle choices that are based on the traditions of countries located on the [employer]. This way of eating has been shown to help prevent certain conditions and improve outcomes for people who have chronic diseases, like kidney disease and heart disease. What are tips for following this plan? Lifestyle  Cook and eat meals together with your family, when possible.  Drink enough fluid to keep your urine clear or pale yellow.  Be physically active every day. This includes: ? Aerobic exercise like running or swimming. ? Leisure activities like gardening, walking, or housework.  Get 7-8 hours of sleep each night.  If recommended by your health care provider, drink red wine in moderation. This means 1 glass a day for nonpregnant women and 2 glasses a day for men. A glass of wine equals 5 oz (150 mL). Reading food labels   Check the serving size of packaged foods. For foods such as rice and pasta, the serving size refers to the amount of cooked product, not dry.  Check the total fat in packaged foods. Avoid foods that have saturated fat or trans fats.  Check the ingredients list for added sugars, such as corn syrup. Shopping  At the grocery store, buy most of your food from the areas near the walls of the store. This includes: ? Fresh fruits and vegetables (produce). ? Grains, beans, nuts, and seeds. Some of these may be available in unpackaged forms or large amounts (in bulk). ? Fresh seafood. ? Poultry and eggs. ? Low-fat dairy products.  Buy whole ingredients instead of prepackaged foods.  Buy fresh fruits and vegetables in-season from local farmers markets.  Buy frozen fruits and vegetables in resealable bags.  If you do not have access to quality fresh seafood, buy precooked frozen shrimp or canned fish, such as tuna, salmon, or sardines.  Buy small amounts of raw or cooked vegetables, salads, or olives from  the deli or salad bar at your store.  Stock your pantry so you always have certain foods on hand, such as olive oil, canned tuna, canned tomatoes, rice, pasta, and beans. Cooking  Cook foods with extra-virgin olive oil instead of using butter or other vegetable oils.  Have meat as a side dish, and have vegetables or grains as your main dish. This means having meat in small portions or adding small amounts of meat to foods like pasta or stew.  Use beans or vegetables instead of meat in common dishes like chili or lasagna.  Experiment with different cooking methods. Try roasting or broiling vegetables instead of steaming or sauteing them.  Add frozen vegetables to soups, stews, pasta, or rice.  Add nuts or seeds for added healthy fat at each meal. You can add these to yogurt, salads, or vegetable dishes.  Marinate fish or vegetables using olive oil, lemon juice, garlic, and fresh herbs. Meal planning   Plan to eat 1 vegetarian meal one day each week. Try to work up to 2 vegetarian meals, if possible.  Eat seafood 2 or more times a week.  Have healthy snacks readily available, such as: ? Vegetable sticks with hummus. ? [place] yogurt. ? Fruit and nut trail mix.  Eat balanced meals throughout the week. This includes: ? Fruit: 2-3 servings a day ? Vegetables: 4-5 servings a day ? Low-fat dairy: 2 servings a day ? Fish, poultry, or lean meat: 1 serving a day ? Beans and legumes: 2 or more servings a week ?  Nuts and seeds: 1-2 servings a day ? Whole grains: 6-8 servings a day ? Extra-virgin olive oil: 3-4 servings a day  Limit red meat and sweets to only a few servings a month What are my food choices?  Mediterranean diet ? Recommended  Grains: Whole-grain pasta. Brown rice. Bulgar wheat. Polenta. Couscous. Whole-wheat bread. Oatmeal. Quinoa.  Vegetables: Artichokes. Beets. Broccoli. Cabbage. Carrots. Eggplant. Green beans. Chard. Kale. Spinach. Onions. Leeks. Peas. Squash.  Tomatoes. Peppers. Radishes.  Fruits: Apples. Apricots. Avocado. Berries. Bananas. Cherries. Dates. Figs. Grapes. Lemons. Melon. Oranges. Peaches. Plums. Pomegranate.  Meats and other protein foods: Beans. Almonds. Sunflower seeds. Pine nuts. Peanuts. Cod. Salmon. Scallops. Shrimp. Tuna. Tilapia. Clams. Oysters. Eggs.  Dairy: Low-fat milk. Cheese. Greek yogurt.  Beverages: Water. Red wine. Herbal tea.  Fats and oils: Extra virgin olive oil. Avocado oil. Grape seed oil.  Sweets and desserts: Greek yogurt with honey. Baked apples. Poached pears. Trail mix.  Seasoning and other foods: Basil. Cilantro. Coriander. Cumin. Mint. Parsley. Sage. Rosemary. Tarragon. Garlic. Oregano. Thyme. Pepper. Balsalmic vinegar. Tahini. Hummus. Tomato sauce. Olives. Mushrooms. ? Limit these  Grains: Prepackaged pasta or rice dishes. Prepackaged cereal with added sugar.  Vegetables: Deep fried potatoes (french fries).  Fruits: Fruit canned in syrup.  Meats and other protein foods: Beef. Pork. Lamb. Poultry with skin. Hot dogs. Bacon.  Dairy: Ice cream. Sour cream. Whole milk.  Beverages: Juice. Sugar-sweetened soft drinks. Beer. Liquor and spirits.  Fats and oils: Butter. Canola oil. Vegetable oil. Beef fat (tallow). Lard.  Sweets and desserts: Cookies. Cakes. Pies. Candy.  Seasoning and other foods: Mayonnaise. Premade sauces and marinades. The items listed may not be a complete list. Talk with your dietitian about what dietary choices are right for you. Summary  The Mediterranean diet includes both food and lifestyle choices.  Eat a variety of fresh fruits and vegetables, beans, nuts, seeds, and whole grains.  Limit the amount of red meat and sweets that you eat.  Talk with your health care provider about whether it is safe for you to drink red wine in moderation. This means 1 glass a day for nonpregnant women and 2 glasses a day for men. A glass of wine equals 5 oz (150 mL). This information  is not intended to replace advice given to you by your health care provider. Make sure you discuss any questions you have with your health care provider. Document Released: 09/30/2015 Document Revised: 10/07/2015 Document Reviewed: 09/30/2015 Elsevier Patient Education  2020 [employer]  

## 2018-11-07 NOTE — Progress Notes (Signed)
Date:  11/07/2018   Name:  Raymond [name]   DOB:  09/06/1955   MRN:  [id number]   Chief Complaint: Hypertension, Hyperlipidemia, Diabetes, Peripheral Neuropathy, Depression (PHQ9=1), and influenza vacc need  Hypertension This is a chronic problem. The current episode started more than 1 year ago. The problem is controlled. Pertinent negatives include no anxiety, blurred vision, chest pain, headaches, malaise/fatigue, neck pain, orthopnea, palpitations, peripheral edema, PND, shortness of breath or sweats. Risk factors for coronary artery disease include diabetes mellitus, dyslipidemia, obesity and post-menopausal state. Past treatments include angiotensin blockers and diuretics. The current treatment provides mild improvement. There is no history of angina, kidney disease, CAD/MI, CVA, heart failure, left ventricular hypertrophy, PVD or retinopathy. Identifiable causes of hypertension include a thyroid problem. There is no history of chronic renal disease, a hypertension causing med or renovascular disease.  Hyperlipidemia This is a chronic problem. The current episode started more than 1 year ago. The problem is controlled. Recent lipid tests were reviewed and are normal. Exacerbating diseases include diabetes. He has no history of chronic renal disease, hypothyroidism, liver disease, obesity or nephrotic syndrome. Factors aggravating his hyperlipidemia include thiazides. Pertinent negatives include no chest pain, focal sensory loss, focal weakness, leg pain, myalgias or shortness of breath. Current antihyperlipidemic treatment includes statins. The current treatment provides moderate improvement of lipids. There are no compliance problems.   Diabetes He presents for his follow-up diabetic visit. He has type 2 diabetes mellitus. His disease course has been stable. Pertinent negatives for hypoglycemia include no confusion, dizziness, headaches, hunger, mood changes, nervousness/anxiousness, pallor,  seizures, sleepiness, speech difficulty, sweats or tremors. Associated symptoms include fatigue. Pertinent negatives for diabetes include no blurred vision, no chest pain, no foot paresthesias, no foot ulcerations, no polydipsia, no polyphagia, no polyuria, no visual change, no weakness and no weight loss. Symptoms are stable. Pertinent negatives for diabetic complications include no CVA, nephropathy, peripheral neuropathy, PVD or retinopathy. Risk factors for coronary artery disease include diabetes mellitus and dyslipidemia. Current diabetic treatment includes oral agent (dual therapy). He is compliant with treatment some of the time. His weight is stable. He is following a generally healthy diet. Meal planning includes avoidance of concentrated sweets and carbohydrate counting. He participates in exercise weekly. His breakfast blood glucose is taken between 8-9 am. His breakfast blood glucose range is generally 140-180 mg/dl. An ACE inhibitor/angiotensin II receptor blocker is being taken.  Depression        This is a chronic problem.  The onset quality is gradual.   The problem has been waxing and waning since onset.  Associated symptoms include fatigue.  Associated symptoms include no myalgias, no headaches and no suicidal ideas.  Past treatments include other medications.  Compliance with treatment is good.  Past medical history includes thyroid problem.     Pertinent negatives include no hypothyroidism and no anxiety. Thyroid Problem Presents for follow-up visit. Symptoms include fatigue. Patient reports no anxiety, constipation, diaphoresis, diarrhea, palpitations, tremors, visual change or weight loss. His past medical history is significant for diabetes and hyperlipidemia. There is no history of heart failure.    Review of Systems  Constitutional: Positive for fatigue. Negative for chills, diaphoresis, fever, malaise/fatigue and weight loss.  HENT: Negative for drooling, ear discharge, ear pain  and sore throat.   Eyes: Negative for blurred vision.  Respiratory: Negative for cough, chest tightness, shortness of breath and wheezing.   Cardiovascular: Negative for chest pain, palpitations, orthopnea, leg swelling and PND.  Gastrointestinal: Negative for abdominal pain, blood in stool, constipation, diarrhea and nausea.  Endocrine: Negative for polydipsia, polyphagia and polyuria.  Genitourinary: Negative for dysuria, frequency, hematuria and urgency.  Musculoskeletal: Negative for back pain, myalgias and neck pain.  Skin: Negative for pallor and rash.  Allergic/Immunologic: Negative for environmental allergies.  Neurological: Negative for dizziness, tremors, focal weakness, seizures, speech difficulty, weakness and headaches.  Hematological: Does not bruise/bleed easily.  Psychiatric/Behavioral: Positive for depression. Negative for confusion and suicidal ideas. The patient is not nervous/anxious.     Patient Active Problem List   Diagnosis Date Noted  . Strain of left knee 08/06/2017  . Obesity (BMI 35.0-39.9 without comorbidity) 08/03/2017  . Status post arthroscopy of left knee 08/03/2017  . Complex tear of medial meniscus of left knee as current injury 07/26/2017  . Chest pain 07/24/2016  . Uncontrolled type 2 diabetes mellitus without complication, without long-term current use of insulin ([place]) 07/24/2016  . Status post total hip replacement, left 01/25/2016  . Benign essential HTN 07/21/2015  . Depression 07/21/2015  . Hyperlipidemia, mixed 07/21/2015  . GERD (gastroesophageal reflux disease) 12/04/2014  . Biceps tendinitis 07/21/2014  . Impingement syndrome of shoulder 07/21/2014  . Primary osteoarthritis of right knee 08/14/2013  . Neuritis or radiculitis due to rupture of lumbar intervertebral disc 08/14/2013    No Known Allergies  Past Surgical History:  Procedure Laterality Date  . CHOLECYSTECTOMY    . CHONDROPLASTY  10/05/09   knee- shaving patellofemoral  joint  . COLONOSCOPY  2009?  . ENDOVENOUS ABLATION SAPHENOUS VEIN W/ LASER    . JOINT REPLACEMENT    . KNEE ARTHROSCOPY Left 07/25/2017   Procedure: Arthroscopic partial medial meniscectomy; arthroscopic debridement; and arthroscopic abrasion chondroplasty of medial femoral condyle, lateral tibial plateau, and femoral trochlea; left knee.;  Surgeon: Corky Mull, MD;  Location: [place];  Service: Orthopedics;  Laterality: Left;  . KNEE ARTHROSCOPY WITH MEDIAL MENISECTOMY Right    Partial medial and lateral menisectomy  . SHOULDER ARTHROSCOPY Left 08/10/2014   Procedure: ARTHROSCOPY SHOULDER WITH LIMITED DEBRIDEMENT, REMOVAL OF LOOSE BODY AND RELEASE OF LONG END BICEPS TENDON;  Surgeon: Leanor Kail, MD;  Location: [place];  Service: Orthopedics;  Laterality: Left;  . TOTAL HIP ARTHROPLASTY Left 01/25/2016   Procedure: TOTAL HIP ARTHROPLASTY;  Surgeon: Corky Mull, MD;  Location: [facility];  Service: Orthopedics;  Laterality: Left;    Social History   Tobacco Use  . Smoking status: Never Smoker  . Smokeless tobacco: Never Used  Substance Use Topics  . Alcohol use: Yes    Comment: rare  . Drug use: No     Medication list has been reviewed and updated.  Current Meds  Medication Sig  . ACCU-CHEK AVIVA PLUS test strip TEST AS DIRECTED  . amLODipine (NORVASC) 10 MG tablet TAKE 1 TABLET BY MOUTH EVERY DAY  . aspirin 81 MG tablet Take 1 tablet (81 mg total) by mouth daily. AM  . buPROPion (WELLBUTRIN) 75 MG tablet TAKE 1 TABLET BY MOUTH TWICE A DAY  . Cholecalciferol 1000 UNITS capsule Take 1,000 Units by mouth daily. AM  . cyanocobalamin 1000 MCG tablet Take 1,000 mcg by mouth daily. AM   . empagliflozin (JARDIANCE) 25 MG TABS tablet Take 25 mg by mouth daily.  Marland Kitchen esomeprazole (NEXIUM) 40 MG capsule TAKE 1 CAPSULE BY MOUTH EVERY DAY (Patient taking differently: TAKE 1 CAPSULE BY MOUTH EVERY DAY/ otc)  . fluticasone (FLOVENT HFA) 110 MCG/ACT inhaler Inhale into  the lungs.  . gabapentin (NEURONTIN) 300 MG capsule TAKE 1 CAPSULE BY MOUTH THREE TIMES A DAY  . glipiZIDE (GLUCOTROL) 5 MG tablet TAKE 1 TABLET BY MOUTH 2 TIMES DAILY BEFORE A MEAL  . glucosamine-chondroitin 500-400 MG tablet Take 1 tablet by mouth daily.  . hydrochlorothiazide (HYDRODIURIL) 25 MG tablet One a day  . losartan (COZAAR) 100 MG tablet TAKE 1/2 TABLET BY MOUTH EVERY DAY  . meloxicam (MOBIC) 15 MG tablet Take 15 mg by mouth daily. [name]  . metFORMIN (GLUCOPHAGE) 500 MG tablet TAKE 1 TABLET BY MOUTH TWICE A DAY WITH MEALS  . omega-3 acid ethyl esters (LOVAZA) 1 g capsule Take 1 g by mouth 2 (two) times daily.  . sertraline (ZOLOFT) 50 MG tablet TAKE 1 TABLET BY MOUTH EVERY DAY  . simvastatin (ZOCOR) 40 MG tablet TAKE 1 TABLET BY MOUTH EVERYDAY AT BEDTIME    PHQ 2/9 Scores 11/07/2018 04/09/2018 03/04/2018 02/04/2018  PHQ - 2 Score 0 0 0 0  PHQ- 9 Score 1 0 1 1    BP Readings from Last 3 Encounters:  11/07/18 120/76  04/09/18 138/72  03/04/18 120/64    Physical Exam Vitals signs and nursing note reviewed.  HENT:     Head: Normocephalic.     Right Ear: External ear normal.     Left Ear: External ear normal.     Nose: Nose normal.  Eyes:     General: No scleral icterus.       Right eye: No discharge.        Left eye: No discharge.     Conjunctiva/sclera: Conjunctivae normal.     Pupils: Pupils are equal, round, and reactive to light.  Neck:     Musculoskeletal: Normal range of motion and neck supple.     Thyroid: No thyromegaly.     Vascular: No JVD.     Trachea: No tracheal deviation.  Cardiovascular:     Rate and Rhythm: Normal rate and regular rhythm.     Chest Wall: PMI is not displaced.     Pulses: Normal pulses.     Heart sounds: Normal heart sounds, S1 normal and S2 normal. No murmur. No systolic murmur. No diastolic murmur. No friction rub. No gallop. No S3 or S4 sounds.   Pulmonary:     Effort: No respiratory distress.     Breath sounds: Normal  breath sounds. No decreased breath sounds, wheezing, rhonchi or rales.  Abdominal:     General: Bowel sounds are normal.     Palpations: Abdomen is soft. There is no mass.     Tenderness: There is no abdominal tenderness. There is no guarding or rebound.  Musculoskeletal: Normal range of motion.        General: No tenderness.     Right lower leg: No edema.     Left lower leg: No edema.  Lymphadenopathy:     Cervical: No cervical adenopathy.  Skin:    General: Skin is warm.     Findings: No rash.  Neurological:     Mental Status: He is alert and oriented to person, place, and time.     Cranial Nerves: No cranial nerve deficit.     Deep Tendon Reflexes: Reflexes are normal and symmetric.     Wt Readings from Last 3 Encounters:  11/07/18 269 lb (122 kg)  04/09/18 280 lb (127 kg)  03/04/18 271 lb (122.9 kg)    BP 120/76   Pulse 72   Ht 6'  1" (1.854 m)   Wt 269 lb (122 kg)   BMI 35.49 kg/m    Assessment and Plan:  1. Essential hypertension Chronic.  Controlled.  Continue amlodipine 10 mg once a day, hydrochlorothiazide 25 mg once a day, and losartan 100 mg once a day.  Will check renal function panel.  Will recheck patient in 6 months. - amLODipine (NORVASC) 10 MG tablet; Take 1 tablet (10 mg total) by mouth daily.  Dispense: 90 tablet; Refill: 1 - hydrochlorothiazide (HYDRODIURIL) 25 MG tablet; One a day  Dispense: 90 tablet; Refill: 1 - losartan (COZAAR) 100 MG tablet; Take 0.5 tablets (50 mg total) by mouth daily.  Dispense: 45 tablet; Refill: 1 - Renal Function Panel  2. Recurrent major depressive disorder, in partial remission [facility]) Patient has chronic depression.  Presently under control with medication current PHQ is 1.  Continue bupropion 75 mg 1 twice a day and sertraline 50 mg 1 a day. - buPROPion (WELLBUTRIN) 75 MG tablet; Take 1 tablet (75 mg total) by mouth 2 (two) times daily.  Dispense: 180 tablet; Refill: 1 - sertraline (ZOLOFT) 50 MG tablet; Take 1 tablet (50  mg total) by mouth daily.  Dispense: 90 tablet; Refill: 1  3. Type 2 diabetes mellitus without complication, without long-term current use of insulin ([facility]) This may be complicated in that the patient is not certain what medications he is taking feels certain that he still taking his glipizide 5 mg twice a day metformin twice a day however his blood sugar seems to be in the 170-180 range.  The question is whether or not he is ever started the [place].  We will do an A1c and renal panel to evaluate glucose in the meantime patient will call back as to whether he is indeed on Jardiance. - empagliflozin (JARDIANCE) 25 MG TABS tablet; Take 25 mg by mouth daily.  Dispense: 90 tablet; Refill: 1 - glipiZIDE (GLUCOTROL) 5 MG tablet; TAKE 1 TABLET BY MOUTH 2 TIMES DAILY BEFORE A MEAL  Dispense: 180 tablet; Refill: 1 - metFORMIN (GLUCOPHAGE) 500 MG tablet; TAKE 1 TABLET BY MOUTH TWICE A DAY WITH MEALS  Dispense: 180 tablet; Refill: 1 - HgB A1c - Microalbumin, urine  4. Other inflammatory polyneuropathies (HCC) Chronic.  Controlled.  Likely secondary to his diabetes.  We will continue Neurontin 3 capsules 3 times a day. - gabapentin (NEURONTIN) 300 MG capsule; 3 capsules tid  Dispense: 270 capsule; Refill: 1  5. History of posttraumatic stress disorder (PTSD) Patient with posttraumatic stress.  We will continue sertraline 50 mg once a day. - sertraline (ZOLOFT) 50 MG tablet; Take 1 tablet (50 mg total) by mouth daily.  Dispense: 90 tablet; Refill: 1  6. Anxiety Patient with history of anxiety which is also under control with sertraline 50 mg 50 mg once a day. - sertraline (ZOLOFT) 50 MG tablet; Take 1 tablet (50 mg total) by mouth daily.  Dispense: 90 tablet; Refill: 1  7. Mixed hyperlipidemia Chronic.  Controlled.  Continue simvastatin 40 mg once a day.  Will check lipid panel. - simvastatin (ZOCOR) 40 MG tablet; 1 tablet daily  Dispense: 90 tablet; Refill: 1 - Lipid Panel With LDL/HDL Ratio  8.  Taking medication for chronic disease Discussed and patient is on a statin and we will do a hepatic function panel to evaluate for hepatotoxicity. - Hepatic function panel  9. Fatigue, unspecified type Patient has had some fatigue which may be secondary to low testosterone patient would like to have the  free and total check.  In the meantime we will also check a CBC and TSH as part of the fatigue work-up. - Testosterone,Free and Total - CBC with Differential/Platelet - TSH  10. Influenza vaccine needed Discussed and administered. - Flu Vaccine QUAD 6+ mos PF IM (Fluarix Quad PF)  11. Weight loss Noted that the weight loss is been over the course of about a year I am wondering if whether his diabetes is been under sufficient control we will know with the A1c.

## 2018-11-11 LAB — RENAL FUNCTION PANEL
Albumin: 4.8 g/dL (ref 3.8–4.8)
BUN/Creatinine Ratio: 15 (ref 10–24)
BUN: 14 mg/dL (ref 8–27)
CO2: 24 mmol/L (ref 20–29)
Calcium: 10 mg/dL (ref 8.6–10.2)
Chloride: 98 mmol/L (ref 96–106)
Creatinine, Ser: 0.95 mg/dL (ref 0.76–1.27)
GFR calc Af Amer: 97 mL/min/{1.73_m2} (ref >59)
GFR calc non Af Amer: 84 mL/min/{1.73_m2} (ref >59)
Glucose: 200 mg/dL — ABNORMAL HIGH (ref 65–99)
Phosphorus: 3.7 mg/dL (ref 2.8–4.1)
Potassium: 4 mmol/L (ref 3.5–5.2)
Sodium: 139 mmol/L (ref 134–144)

## 2018-11-11 LAB — CBC WITH DIFFERENTIAL/PLATELET
Basophils Absolute: 0.1 10*3/uL (ref 0.0–0.2)
Basos: 1 %
EOS (ABSOLUTE): 0.3 10*3/uL (ref 0.0–0.4)
Eos: 4 %
Hematocrit: 47.4 % (ref 37.5–51.0)
Hemoglobin: 16.2 g/dL (ref 13.0–17.7)
Immature Grans (Abs): 0 10*3/uL (ref 0.0–0.1)
Immature Granulocytes: 1 %
Lymphocytes Absolute: 1.2 10*3/uL (ref 0.7–3.1)
Lymphs: 14 %
MCH: 29.1 pg (ref 26.6–33.0)
MCHC: 34.2 g/dL (ref 31.5–35.7)
MCV: 85 fL (ref 79–97)
Monocytes Absolute: 0.7 10*3/uL (ref 0.1–0.9)
Monocytes: 8 %
Neutrophils Absolute: 6.4 10*3/uL (ref 1.4–7.0)
Neutrophils: 72 %
Platelets: 201 10*3/uL (ref 150–450)
RBC: 5.57 x10E6/uL (ref 4.14–5.80)
RDW: 13.5 % (ref 11.6–15.4)
WBC: 8.7 10*3/uL (ref 3.4–10.8)

## 2018-11-11 LAB — HEPATIC FUNCTION PANEL
ALT: 19 IU/L (ref 0–44)
AST: 16 IU/L (ref 0–40)
Alkaline Phosphatase: 87 IU/L (ref 39–117)
Bilirubin Total: 0.8 mg/dL (ref 0.0–1.2)
Bilirubin, Direct: 0.17 mg/dL (ref 0.00–0.40)
Total Protein: 7.7 g/dL (ref 6.0–8.5)

## 2018-11-11 LAB — LIPID PANEL WITH LDL/HDL RATIO
Cholesterol, Total: 200 mg/dL — ABNORMAL HIGH (ref 100–199)
HDL: 51 mg/dL (ref >39)
LDL Chol Calc (NIH): 127 mg/dL — ABNORMAL HIGH (ref 0–99)
LDL/HDL Ratio: 2.5 ratio (ref 0.0–3.6)
Triglycerides: 126 mg/dL (ref 0–149)
VLDL Cholesterol Cal: 22 mg/dL (ref 5–40)

## 2018-11-11 LAB — HEMOGLOBIN A1C
Est. average glucose Bld gHb Est-mCnc: 148 mg/dL
Hgb A1c MFr Bld: 6.8 % — ABNORMAL HIGH (ref 4.8–5.6)

## 2018-11-11 LAB — TESTOSTERONE,FREE AND TOTAL
Testosterone, Free: 10.6 pg/mL (ref 6.6–18.1)
Testosterone: 479 ng/dL (ref 264–916)

## 2018-11-11 LAB — TSH: TSH: 2.62 u[IU]/mL (ref 0.450–4.500)

## 2018-11-11 LAB — MICROALBUMIN, URINE: Microalbumin, Urine: 15.4 ug/mL

## 2018-12-20 ENCOUNTER — Other Ambulatory Visit: Payer: Self-pay | Admitting: Surgery

## 2018-12-23 ENCOUNTER — Other Ambulatory Visit: Payer: Self-pay | Admitting: Orthopedic Surgery

## 2018-12-23 DIAGNOSIS — M5136 Other intervertebral disc degeneration, lumbar region: Secondary | ICD-10-CM

## 2018-12-25 ENCOUNTER — Ambulatory Visit
Admission: RE | Admit: 2018-12-25 | Discharge: 2018-12-25 | Disposition: A | Discharge status: Home / Self Care | Payer: BC Managed Care – PPO | Source: Ambulatory Visit | Attending: Orthopedic Surgery | Admitting: Orthopedic Surgery

## 2018-12-25 ENCOUNTER — Other Ambulatory Visit: Payer: Self-pay

## 2018-12-25 DIAGNOSIS — M5117 Intervertebral disc disorders with radiculopathy, lumbosacral region: Secondary | ICD-10-CM | POA: Diagnosis not present

## 2018-12-25 DIAGNOSIS — M5136 Other intervertebral disc degeneration, lumbar region: Secondary | ICD-10-CM

## 2018-12-25 MED ORDER — METHYLPREDNISOLONE ACETATE 40 MG/ML INJ SUSP (RADIOLOG
120.0000 mg | Freq: Once | INTRAMUSCULAR | Status: AC
  Administered 2018-12-25: 12:00:00 120 mg via EPIDURAL

## 2018-12-25 MED ORDER — IOPAMIDOL (ISOVUE-M 200) INJECTION 41%
1.0000 mL | Freq: Once | INTRAMUSCULAR | Status: AC
  Administered 2018-12-25: 1 mL via EPIDURAL

## 2018-12-25 NOTE — Discharge Instructions (Signed)

## 2018-12-26 ENCOUNTER — Other Ambulatory Visit: Payer: Managed Care, Other (non HMO)

## 2018-12-26 DIAGNOSIS — M7582 Other shoulder lesions, left shoulder: Secondary | ICD-10-CM | POA: Diagnosis not present

## 2018-12-27 ENCOUNTER — Other Ambulatory Visit: Payer: Managed Care, Other (non HMO)

## 2018-12-30 ENCOUNTER — Other Ambulatory Visit: Payer: Self-pay

## 2018-12-30 ENCOUNTER — Ambulatory Visit (INDEPENDENT_AMBULATORY_CARE_PROVIDER_SITE_OTHER): Payer: BC Managed Care – PPO | Admitting: Vascular Surgery

## 2018-12-30 ENCOUNTER — Other Ambulatory Visit: Payer: BC Managed Care – PPO

## 2018-12-30 ENCOUNTER — Encounter (INDEPENDENT_AMBULATORY_CARE_PROVIDER_SITE_OTHER): Payer: Self-pay | Admitting: Vascular Surgery

## 2018-12-30 ENCOUNTER — Ambulatory Visit (INDEPENDENT_AMBULATORY_CARE_PROVIDER_SITE_OTHER): Payer: BC Managed Care – PPO

## 2018-12-30 ENCOUNTER — Other Ambulatory Visit (INDEPENDENT_AMBULATORY_CARE_PROVIDER_SITE_OTHER): Payer: Self-pay | Admitting: Vascular Surgery

## 2018-12-30 ENCOUNTER — Encounter
Admission: RE | Admit: 2018-12-30 | Discharge: 2018-12-30 | Disposition: A | Discharge status: Home / Self Care | Payer: BC Managed Care – PPO | Source: Ambulatory Visit | Attending: Surgery | Admitting: Surgery

## 2018-12-30 VITALS — BP 156/72 | HR 71 | Resp 17 | Ht 73.0 in | Wt 263.0 lb

## 2018-12-30 DIAGNOSIS — E119 Type 2 diabetes mellitus without complications: Secondary | ICD-10-CM | POA: Insufficient documentation

## 2018-12-30 DIAGNOSIS — I83812 Varicose veins of left lower extremities with pain: Secondary | ICD-10-CM

## 2018-12-30 DIAGNOSIS — Z20828 Contact with and (suspected) exposure to other viral communicable diseases: Secondary | ICD-10-CM | POA: Insufficient documentation

## 2018-12-30 DIAGNOSIS — I491 Atrial premature depolarization: Secondary | ICD-10-CM | POA: Diagnosis not present

## 2018-12-30 DIAGNOSIS — K219 Gastro-esophageal reflux disease without esophagitis: Secondary | ICD-10-CM | POA: Diagnosis not present

## 2018-12-30 DIAGNOSIS — E782 Mixed hyperlipidemia: Secondary | ICD-10-CM

## 2018-12-30 DIAGNOSIS — Z01818 Encounter for other preprocedural examination: Secondary | ICD-10-CM | POA: Insufficient documentation

## 2018-12-30 DIAGNOSIS — I10 Essential (primary) hypertension: Secondary | ICD-10-CM | POA: Diagnosis not present

## 2018-12-30 DIAGNOSIS — R9431 Abnormal electrocardiogram [ECG] [EKG]: Secondary | ICD-10-CM | POA: Insufficient documentation

## 2018-12-30 NOTE — Patient Instructions (Signed)
Your procedure is scheduled on: Thurs. 11/12 Report to Day Surgery. To find out your arrival time please call [phone] between 1PM - 3PM on Wed. 11/11.  Remember: Instructions that are not followed completely may result in serious medical risk,  up to and including death, or upon the discretion of your surgeon and anesthesiologist your  surgery may need to be rescheduled.     _X__ 1. Do not eat food after midnight the night before your procedure.                 No gum chewing or hard candies. You may drink clear liquids up to 2 hours                 before you are scheduled to arrive for your surgery- DO not drink clear                 liquids within 2 hours of the start of your surgery.                 Clear Liquids include:  water, apple juice without pulp, clear carbohydrate                 drink such as Clearfast of Gatorade, Black Coffee or Tea (Do not add                 anything to coffee or tea).  __X__2.  On the morning of surgery brush your teeth with toothpaste and water, you                may rinse your mouth with mouthwash if you wish.  Do not swallow any toothpaste of mouthwash.     _X__ 3.  No Alcohol for 24 hours before or after surgery.   ___ 4.  Do Not Smoke or use e-cigarettes For 24 Hours Prior to Your Surgery.                 Do not use any chewable tobacco products for at least 6 hours prior to                 surgery.  ____  5.  Bring all medications with you on the day of surgery if instructed.   __x__  6.  Notify your doctor if there is any change in your medical condition      (cold, fever, infections).     Do not wear jewelry, make-up, hairpins, clips or nail polish. Do not wear lotions, powders, or perfumes. You may wear deodorant. Do not shave 48 hours prior to surgery. Men may shave face and neck. Do not bring valuables to the hospital.    [facility] is not responsible for any belongings or valuables.  Contacts,  dentures or bridgework may not be worn into surgery. Leave your suitcase in the car. After surgery it may be brought to your room. For patients admitted to the hospital, discharge time is determined by your treatment team.   Patients discharged the day of surgery will not be allowed to drive home.   Please read over the following fact sheets that you were given:     _x___ Take these medicines the morning of surgery with A SIP OF WATER:    1. amLODipine (NORVASC) 10 MG tablet  2. buPROPion (WELLBUTRIN) 75 MG tablet  3. escitalopram (LEXAPRO) 20 MG tablet  4.esomeprazole (NEXIUM) 40 MG capsule night before and morning of surgery  5.gabapentin (NEURONTIN)  300 MG capsule  6.sertraline (ZOLOFT) 50 MG tablet  If you take in the morning             7. traMADol (ULTRAM) 50 MG tablet if needed  ____ Fleet Enema (as directed)   __x__ Use CHG Soap as directed  ____ Use inhalers on the day of surgery  __x__ Stop metformin 2 days prior to surgery  Last dose tonight    ____ Take 1/2 of usual insulin dose the night before surgery. No insulin the morning          of surgery.   __x__ Stop aspirin today  __x__ Stop Anti-inflammatories meloxicam (MOBIC) 15 MG tablet   __x__ Stop supplements until after surgery.omega-3 acid ethyl esters (LOVAZA) 1 g capsule, glucosamine-chondroitin 500-400 MG tablet  ____ Bring C-Pap to the hospital.

## 2018-12-30 NOTE — Progress Notes (Signed)
MRN : [id number]  Raymond [name] is a 63 y.o. (1955-10-20) male who presents with chief complaint of  Chief Complaint  Patient presents with   Follow-up    consult left leg  .  History of Present Illness:   The patient returns for followup evaluation several years after his last visit.   In 2017 he underwent right GSV ablation which was very successful.  He was in the process of getting his left GSV treated but that did not happen secondary to extenuating circumstances. The patient continues to have pain in the left lower extremity particularly with dependency. The pain is lessened with elevation. Graduated compression stockings, Class I (20-30 mmHg), have been worn since 2017 but the stockings do not eliminate the leg pain and in fact he states the leg pain is gotten quite a bit worse. Over-the-counter analgesics do not improve the symptoms.  He is also continue to exercise on a regular basis.  He states the degree of discomfort continues to interfere with daily activities. The patient notes the pain in the legs is causing problems with daily exercise, at the workplace and even with household activities and maintenance such as standing in the kitchen preparing meals and doing dishes.   Venous ultrasound shows normal deep venous system, no evidence of acute or chronic DVT.  Superficial reflux is present in the left great saphenous vein Current Meds  Medication Sig   amLODipine (NORVASC) 10 MG tablet Take 1 tablet (10 mg total) by mouth daily.   aspirin 81 MG tablet Take 1 tablet (81 mg total) by mouth daily. AM   buPROPion (WELLBUTRIN) 75 MG tablet Take 1 tablet (75 mg total) by mouth 2 (two) times daily.   Cholecalciferol 1000 UNITS capsule Take 1,000 Units by mouth daily. AM   empagliflozin (JARDIANCE) 25 MG TABS tablet Take 25 mg by mouth daily.   escitalopram (LEXAPRO) 20 MG tablet Take 20 mg by mouth daily.   esomeprazole (NEXIUM) 40 MG capsule TAKE 1 CAPSULE BY MOUTH EVERY DAY  (Patient taking differently: Take 40 mg by mouth daily. )   gabapentin (NEURONTIN) 300 MG capsule 3 capsules tid (Patient taking differently: Take 300 mg by mouth 3 (three) times daily. )   glipiZIDE (GLUCOTROL) 5 MG tablet TAKE 1 TABLET BY MOUTH 2 TIMES DAILY BEFORE A MEAL   glucosamine-chondroitin 500-400 MG tablet Take 1 tablet by mouth 3 (three) times daily.   hydrochlorothiazide (HYDRODIURIL) 25 MG tablet One a day (Patient taking differently: Take 25 mg by mouth daily. One a day)   losartan (COZAAR) 100 MG tablet Take 0.5 tablets (50 mg total) by mouth daily.   meloxicam (MOBIC) 15 MG tablet Take 15 mg by mouth daily. [name]   metFORMIN (GLUCOPHAGE) 500 MG tablet TAKE 1 TABLET BY MOUTH TWICE A DAY WITH MEALS (Patient taking differently: Take 500 mg by mouth 2 (two) times daily with a meal. )   omega-3 acid ethyl esters (LOVAZA) 1 g capsule Take 1 g by mouth daily.    sertraline (ZOLOFT) 50 MG tablet Take 1 tablet (50 mg total) by mouth daily.   simvastatin (ZOCOR) 40 MG tablet 1 tablet daily (Patient taking differently: Take 40 mg by mouth daily. )   tiZANidine (ZANAFLEX) 2 MG tablet Take 2 mg by mouth at bedtime.   traMADol (ULTRAM) 50 MG tablet Take 50 mg by mouth daily as needed for moderate pain.     Past Medical History:  Diagnosis Date  Allergy    Arthritis    Asthma    mild   Carpal tunnel syndrome    bilateral   Depression    Diabetes mellitus without complication (HCC)    Type II   GERD (gastroesophageal reflux disease)    Hyperlipidemia    Hypertension    neg stress test 10 yrs ago - Dr Humphrey Rolls   Sleep apnea    has BiPAP -doesn't use   Varicose vein    treated by Dr. Delana Meyer    Past Surgical History:  Procedure Laterality Date   CHOLECYSTECTOMY     CHONDROPLASTY  10/05/09   knee- shaving patellofemoral joint   COLONOSCOPY  2009?   ENDOVENOUS ABLATION SAPHENOUS VEIN W/ LASER     JOINT REPLACEMENT Left    hip   KNEE ARTHROSCOPY  Left 07/25/2017   Procedure: Arthroscopic partial medial meniscectomy; arthroscopic debridement; and arthroscopic abrasion chondroplasty of medial femoral condyle, lateral tibial plateau, and femoral trochlea; left knee.;  Surgeon: Corky Mull, MD;  Location: [place];  Service: Orthopedics;  Laterality: Left;   KNEE ARTHROSCOPY WITH MEDIAL MENISECTOMY Right    Partial medial and lateral menisectomy   SHOULDER ARTHROSCOPY Left 08/10/2014   Procedure: ARTHROSCOPY SHOULDER WITH LIMITED DEBRIDEMENT, REMOVAL OF LOOSE BODY AND RELEASE OF LONG END BICEPS TENDON;  Surgeon: Leanor Kail, MD;  Location: [place];  Service: Orthopedics;  Laterality: Left;   TOTAL HIP ARTHROPLASTY Left 01/25/2016   Procedure: TOTAL HIP ARTHROPLASTY;  Surgeon: Corky Mull, MD;  Location: [facility];  Service: Orthopedics;  Laterality: Left;    Social History Social History   Tobacco Use   Smoking status: Never Smoker   Smokeless tobacco: Never Used  Substance Use Topics   Alcohol use: Yes    Comment: rare   Drug use: No    Family History No family history on file. No family history of bleeding/clotting disorders, porphyria or autoimmune disease   No Known Allergies   REVIEW OF SYSTEMS (Negative unless checked)  Constitutional: [] Weight loss  [] Fever  [] Chills Cardiac: [] Chest pain   [] Chest pressure   [] Palpitations   [] Shortness of breath when laying flat   [] Shortness of breath with exertion. Vascular:  [] Pain in legs with walking   [x] Pain in legs at rest  [] History of DVT   [] Phlebitis   [x] Swelling in legs   [x] Varicose veins   [] Non-healing ulcers Pulmonary:   [] Uses home oxygen   [] Productive cough   [] Hemoptysis   [] Wheeze  [] COPD   [] Asthma Neurologic:  [] Dizziness   [] Seizures   [] History of stroke   [] History of TIA  [] Aphasia   [] Vissual changes   [] Weakness or numbness in arm   [] Weakness or numbness in leg Musculoskeletal:   [] Joint swelling   [x] Joint pain   [x] Low  back pain Hematologic:  [] Easy bruising  [] Easy bleeding   [] Hypercoagulable state   [] Anemic Gastrointestinal:  [] Diarrhea   [] Vomiting  [x] Gastroesophageal reflux/heartburn   [] Difficulty swallowing. Genitourinary:  [] Chronic kidney disease   [] Difficult urination  [] Frequent urination   [] Blood in urine Skin:  [] Rashes   [] Ulcers  Psychological:  [] History of anxiety   []  History of major depression.  Physical Examination  Vitals:   12/30/18 1308  BP: (!) 156/72  Pulse: 71  Resp: 17  Weight: 263 lb (119.3 kg)  Height: 6\' 1"  (1.854 m)   Body mass index is 34.7 kg/m. Gen: WD/WN, NAD Head: [place]/AT, No temporalis wasting.  Ear/Nose/Throat: Hearing grossly intact,  nares w/o erythema or drainage, poor dentition Eyes: PER, EOMI, sclera nonicteric.  Neck: Supple, no masses.  No bruit or JVD.  Pulmonary:  Good air movement, clear to auscultation bilaterally, no use of accessory muscles.  Cardiac: RRR, normal S1, S2, no Murmurs. Vascular: Large varicosities present extensively greater than 10 mm left leg.  Mild venous stasis changes to the legs bilaterally.  2+ soft pitting edema Vessel Right Left  Radial Palpable Palpable  PT Palpable Palpable  DP Palpable Palpable  Gastrointestinal: soft, non-distended. No guarding/no peritoneal signs.  Musculoskeletal: M/S 5/5 throughout.  No deformity or atrophy.  Neurologic: CN 2-12 intact. Pain and light touch intact in extremities.  Symmetrical.  Speech is fluent. Motor exam as listed above. Psychiatric: Judgment intact, Mood & affect appropriate for pt's clinical situation. Dermatologic: No rashes or ulcers noted.  No changes consistent with cellulitis. Lymph : No Cervical lymphadenopathy, no lichenification or skin changes of chronic lymphedema.  CBC Lab Results  Component Value Date   WBC 8.7 11/07/2018   HGB 16.2 11/07/2018   HCT 47.4 11/07/2018   MCV 85 11/07/2018   PLT 201 11/07/2018    BMET    Component Value Date/Time   NA 139  11/07/2018 0900   K 4.0 11/07/2018 0900   CL 98 11/07/2018 0900   CO2 24 11/07/2018 0900   GLUCOSE 200 (H) 11/07/2018 0900   GLUCOSE 162 (H) 01/27/2016 0403   BUN 14 11/07/2018 0900   CREATININE 0.95 11/07/2018 0900   CALCIUM 10.0 11/07/2018 0900   GFRNONAA 84 11/07/2018 0900   GFRAA 97 11/07/2018 0900   CrCl cannot be calculated (Patient's most recent lab result is older than the maximum 21 days allowed.).  COAG Lab Results  Component Value Date   INR 1.01 01/12/2016    Radiology Dg Inject Diag/thera/inc Needle/cath/plc Epi/lumb/sac W/img  Result Date: 12/25/2018 CLINICAL DATA:  Degenerative disc disease. Displacement of the L5-S1 lumbar discs. Right lower extremity radiculopathy. FLUOROSCOPY TIME:  Radiation Exposure Index (as provided by the fluoroscopic device): 33.3 uGy*m2 PROCEDURE: The procedure, risks, benefits, and alternatives were explained to the patient. Questions regarding the procedure were encouraged and answered. The patient understands and consents to the procedure. LUMBAR EPIDURAL INJECTION: An interlaminar approach was performed on right at L4-5. The overlying skin was cleansed and anesthetized. A 20 gauge epidural needle was advanced using loss-of-resistance technique. DIAGNOSTIC EPIDURAL INJECTION: Injection of Isovue-M 200 shows a good epidural pattern with spread above and below the level of needle placement, primarily on the right no vascular opacification is seen. THERAPEUTIC EPIDURAL INJECTION: 120 mg of Depo-Medrol mixed with 3 mL 1% lidocaine were instilled. The procedure was well-tolerated, and the patient was discharged thirty minutes following the injection in good condition. COMPLICATIONS: None IMPRESSION: Technically successful epidural injection on the right L5-S1 # 1 Electronically Signed   By: San Morelle M.D.   On: 12/25/2018 14:03     Assessment/Plan 1. Varicose veins of leg with pain, left Recommend  I have reviewed my previous   discussion with the patient regarding  varicose veins and why they cause symptoms. Patient will continue  wearing graduated compression stockings class 1 on a daily basis, beginning first thing in the morning and removing them in the evening.    In addition, behavioral modification including elevation during the day was again discussed and this will continue.  The patient has utilized over the counter pain medications and has been exercising.  However, at this time conservative therapy has not  alleviated the patient's symptoms of leg pain and swelling  Recommend: laser ablation of the left great saphenous vein to eliminate the symptoms of pain and swelling of the lower extremities caused by the severe superficial venous reflux disease.   2. Benign essential HTN Continue antihypertensive medications as already ordered, these medications have been reviewed and there are no changes at this time.   3. Gastroesophageal reflux disease, unspecified whether esophagitis present Continue PPI as already ordered, this medication has been reviewed and there are no changes at this time.  Avoidence of caffeine and alcohol  Moderate elevation of the head of the bed   4. Hyperlipidemia, mixed Continue statin as ordered and reviewed, no changes at this time     Hortencia Pilar, MD  12/30/2018 1:14 PM

## 2018-12-31 LAB — SARS CORONAVIRUS 2 (TAT 6-24 HRS): SARS Coronavirus 2: NEGATIVE

## 2019-01-01 MED ORDER — DEXTROSE 5 % IV SOLN
3.0000 g | INTRAVENOUS | Status: AC
  Administered 2019-01-02: 08:00:00 3 g via INTRAVENOUS
  Filled 2019-01-01: qty 3000

## 2019-01-02 ENCOUNTER — Ambulatory Visit
Admission: RE | Admit: 2019-01-02 | Discharge: 2019-01-02 | Disposition: A | Discharge status: Home / Self Care | Payer: BC Managed Care – PPO | Attending: Surgery | Admitting: Surgery

## 2019-01-02 ENCOUNTER — Other Ambulatory Visit: Payer: Self-pay

## 2019-01-02 ENCOUNTER — Ambulatory Visit: Payer: BC Managed Care – PPO | Admitting: Anesthesiology

## 2019-01-02 ENCOUNTER — Encounter
Admission: RE | Disposition: A | Discharge status: Home / Self Care | Payer: Self-pay | Source: Home / Self Care | Attending: Surgery

## 2019-01-02 ENCOUNTER — Encounter: Payer: Self-pay | Admitting: *Deleted

## 2019-01-02 DIAGNOSIS — Z7984 Long term (current) use of oral hypoglycemic drugs: Secondary | ICD-10-CM | POA: Diagnosis not present

## 2019-01-02 DIAGNOSIS — M1710 Unilateral primary osteoarthritis, unspecified knee: Secondary | ICD-10-CM | POA: Insufficient documentation

## 2019-01-02 DIAGNOSIS — E782 Mixed hyperlipidemia: Secondary | ICD-10-CM | POA: Insufficient documentation

## 2019-01-02 DIAGNOSIS — Z7982 Long term (current) use of aspirin: Secondary | ICD-10-CM | POA: Insufficient documentation

## 2019-01-02 DIAGNOSIS — F329 Major depressive disorder, single episode, unspecified: Secondary | ICD-10-CM | POA: Insufficient documentation

## 2019-01-02 DIAGNOSIS — J45909 Unspecified asthma, uncomplicated: Secondary | ICD-10-CM | POA: Diagnosis not present

## 2019-01-02 DIAGNOSIS — Z79891 Long term (current) use of opiate analgesic: Secondary | ICD-10-CM | POA: Diagnosis not present

## 2019-01-02 DIAGNOSIS — Z96642 Presence of left artificial hip joint: Secondary | ICD-10-CM | POA: Insufficient documentation

## 2019-01-02 DIAGNOSIS — Z79899 Other long term (current) drug therapy: Secondary | ICD-10-CM | POA: Insufficient documentation

## 2019-01-02 DIAGNOSIS — G5601 Carpal tunnel syndrome, right upper limb: Secondary | ICD-10-CM | POA: Insufficient documentation

## 2019-01-02 DIAGNOSIS — Z6834 Body mass index (BMI) 34.0-34.9, adult: Secondary | ICD-10-CM | POA: Diagnosis not present

## 2019-01-02 DIAGNOSIS — E119 Type 2 diabetes mellitus without complications: Secondary | ICD-10-CM | POA: Diagnosis not present

## 2019-01-02 DIAGNOSIS — K219 Gastro-esophageal reflux disease without esophagitis: Secondary | ICD-10-CM | POA: Insufficient documentation

## 2019-01-02 DIAGNOSIS — E669 Obesity, unspecified: Secondary | ICD-10-CM | POA: Diagnosis not present

## 2019-01-02 DIAGNOSIS — I10 Essential (primary) hypertension: Secondary | ICD-10-CM | POA: Diagnosis not present

## 2019-01-02 DIAGNOSIS — G4730 Sleep apnea, unspecified: Secondary | ICD-10-CM | POA: Diagnosis not present

## 2019-01-02 HISTORY — PX: CARPAL TUNNEL RELEASE: SHX101

## 2019-01-02 LAB — GLUCOSE, CAPILLARY
Glucose-Capillary: 191 mg/dL — ABNORMAL HIGH (ref 70–99)
Glucose-Capillary: 183 mg/dL — ABNORMAL HIGH (ref 70–99)

## 2019-01-02 SURGERY — RELEASE, CARPAL TUNNEL, ENDOSCOPIC
Anesthesia: General | Site: Wrist | Laterality: Right

## 2019-01-02 MED ORDER — OXYCODONE HCL 5 MG PO TABS: 5.0000 mg | ORAL_TABLET | Freq: Once | ORAL | Status: DC | PRN

## 2019-01-02 MED ORDER — DEXAMETHASONE SODIUM PHOSPHATE 10 MG/ML IJ SOLN
INTRAMUSCULAR | Status: DC | PRN
  Administered 2019-01-02: 10 mg via INTRAVENOUS

## 2019-01-02 MED ORDER — SODIUM CHLORIDE 0.9 % IV SOLN
INTRAVENOUS | Status: DC
  Administered 2019-01-02: 07:00:00 via INTRAVENOUS

## 2019-01-02 MED ORDER — FENTANYL CITRATE (PF) 100 MCG/2ML IJ SOLN: 25.0000 ug | INTRAMUSCULAR | Status: DC | PRN

## 2019-01-02 MED ORDER — DEXAMETHASONE SODIUM PHOSPHATE 10 MG/ML IJ SOLN
INTRAMUSCULAR | Status: AC
  Filled 2019-01-02: qty 1

## 2019-01-02 MED ORDER — PROPOFOL 10 MG/ML IV BOLUS
INTRAVENOUS | Status: DC | PRN
  Administered 2019-01-02: 100 mg via INTRAVENOUS

## 2019-01-02 MED ORDER — OXYCODONE HCL 5 MG/5ML PO SOLN: 5.0000 mg | Freq: Once | ORAL | Status: DC | PRN

## 2019-01-02 MED ORDER — FENTANYL CITRATE (PF) 100 MCG/2ML IJ SOLN
INTRAMUSCULAR | Status: DC | PRN
  Administered 2019-01-02: 50 ug via INTRAVENOUS
  Administered 2019-01-02: 25 ug via INTRAVENOUS

## 2019-01-02 MED ORDER — HYDROCODONE-ACETAMINOPHEN 5-325 MG PO TABS: 1.0000 | ORAL_TABLET | Freq: Four times a day (QID) | ORAL | 0 refills | Status: DC | PRN

## 2019-01-02 MED ORDER — LIDOCAINE HCL (PF) 2 % IJ SOLN
INTRAMUSCULAR | Status: AC
  Filled 2019-01-02: qty 10

## 2019-01-02 MED ORDER — CHLORHEXIDINE GLUCONATE 4 % EX LIQD: 60.0000 mL | Freq: Once | CUTANEOUS | Status: DC

## 2019-01-02 MED ORDER — ONDANSETRON HCL 4 MG/2ML IJ SOLN
INTRAMUSCULAR | Status: AC
  Filled 2019-01-02: qty 2

## 2019-01-02 MED ORDER — MIDAZOLAM HCL 2 MG/2ML IJ SOLN
INTRAMUSCULAR | Status: AC
  Filled 2019-01-02: qty 2

## 2019-01-02 MED ORDER — MIDAZOLAM HCL 2 MG/2ML IJ SOLN
INTRAMUSCULAR | Status: DC | PRN
  Administered 2019-01-02: 2 mg via INTRAVENOUS

## 2019-01-02 MED ORDER — ONDANSETRON HCL 4 MG/2ML IJ SOLN
INTRAMUSCULAR | Status: DC | PRN
  Administered 2019-01-02: 4 mg via INTRAVENOUS

## 2019-01-02 MED ORDER — LIDOCAINE HCL (CARDIAC) PF 100 MG/5ML IV SOSY
PREFILLED_SYRINGE | INTRAVENOUS | Status: DC | PRN
  Administered 2019-01-02: 100 mg via INTRAVENOUS

## 2019-01-02 MED ORDER — BUPIVACAINE HCL (PF) 0.5 % IJ SOLN
INTRAMUSCULAR | Status: AC
  Filled 2019-01-02: qty 30

## 2019-01-02 MED ORDER — PROPOFOL 10 MG/ML IV BOLUS
INTRAVENOUS | Status: AC
  Filled 2019-01-02: qty 20

## 2019-01-02 MED ORDER — BUPIVACAINE HCL (PF) 0.5 % IJ SOLN
INTRAMUSCULAR | Status: DC | PRN
  Administered 2019-01-02: 10 mL

## 2019-01-02 MED ORDER — FENTANYL CITRATE (PF) 100 MCG/2ML IJ SOLN
INTRAMUSCULAR | Status: AC
  Filled 2019-01-02: qty 2

## 2019-01-02 SURGICAL SUPPLY — 31 items
BNDG COHESIVE 4X5 TAN STRL (GAUZE/BANDAGES/DRESSINGS) ×3 IMPLANT
BNDG ELASTIC 2X5.8 VLCR STR LF (GAUZE/BANDAGES/DRESSINGS) ×3 IMPLANT
BNDG ESMARK 4X12 TAN STRL LF (GAUZE/BANDAGES/DRESSINGS) ×3 IMPLANT
CANISTER SUCT 1200ML W/VALVE (MISCELLANEOUS) ×3 IMPLANT
CHLORAPREP W/TINT 26 (MISCELLANEOUS) ×3 IMPLANT
CORD BIP STRL DISP 12FT (MISCELLANEOUS) ×3 IMPLANT
COVER WAND RF STERILE (DRAPES) ×3 IMPLANT
CUFF TOURN SGL QUICK 18X4 (TOURNIQUET CUFF) IMPLANT
CUFF TOURN SGL QUICK 24 (TOURNIQUET CUFF) ×2
CUFF TRNQT CYL 24X4X16.5-23 (TOURNIQUET CUFF) ×1 IMPLANT
DRAPE SURG 17X11 SM STRL (DRAPES) ×3 IMPLANT
FORCEPS JEWEL BIP 4-3/4 STR (INSTRUMENTS) ×3 IMPLANT
GAUZE SPONGE 4X4 12PLY STRL (GAUZE/BANDAGES/DRESSINGS) ×3 IMPLANT
GAUZE XEROFORM 1X8 LF (GAUZE/BANDAGES/DRESSINGS) ×3 IMPLANT
GLOVE BIO SURGEON STRL SZ8 (GLOVE) ×3 IMPLANT
GLOVE INDICATOR 8.0 STRL GRN (GLOVE) ×3 IMPLANT
GOWN STRL REUS W/ TWL LRG LVL3 (GOWN DISPOSABLE) ×1 IMPLANT
GOWN STRL REUS W/ TWL XL LVL3 (GOWN DISPOSABLE) ×1 IMPLANT
GOWN STRL REUS W/TWL LRG LVL3 (GOWN DISPOSABLE) ×2
GOWN STRL REUS W/TWL XL LVL3 (GOWN DISPOSABLE) ×2
KIT CARPAL TUNNEL (MISCELLANEOUS) ×2
KIT ESCP INSRT D SLOT CANN KN (MISCELLANEOUS) ×1 IMPLANT
KIT TURNOVER KIT A (KITS) ×3 IMPLANT
NS IRRIG 500ML POUR BTL (IV SOLUTION) ×3 IMPLANT
PACK EXTREMITY ARMC (MISCELLANEOUS) ×3 IMPLANT
SPLINT WRIST LG LT TX990309 (SOFTGOODS) IMPLANT
SPLINT WRIST LG RT TX900304 (SOFTGOODS) ×3 IMPLANT
SPLINT WRIST M LT TX990308 (SOFTGOODS) IMPLANT
SPLINT WRIST M RT TX990303 (SOFTGOODS) IMPLANT
STOCKINETTE IMPERVIOUS 9X36 MD (GAUZE/BANDAGES/DRESSINGS) ×3 IMPLANT
SUT PROLENE 4 0 PS 2 18 (SUTURE) ×3 IMPLANT

## 2019-01-02 NOTE — Discharge Instructions (Addendum)
AMBULATORY SURGERY  DISCHARGE INSTRUCTIONS   1) The drugs that you were given will stay in your system until tomorrow so for the next 24 hours you should not:  A) Drive an automobile B) Make any legal decisions C) Drink any alcoholic beverage   2) You may resume regular meals tomorrow.  Today it is better to start with liquids and gradually work up to solid foods.  You may eat anything you prefer, but it is better to start with liquids, then soup and crackers, and gradually work up to solid foods.   3) Please notify your doctor immediately if you have any unusual bleeding, trouble breathing, redness and pain at the surgery site, drainage, fever, or pain not relieved by medication.    4) Additional Instructions:        Please contact your physician with any problems or Same Day Surgery at [phone], Monday through Friday 6 am to 4 pm, or [place] at [facility] number at [phone].Orthopedic discharge instructions: Keep dressing dry and intact. Keep hand elevated above heart level. May shower after dressing removed on postop day 4 (Monday). Cover sutures with Band-Aids after drying off. Apply ice to affected area frequently. Take Mobic 15 mg daily OR ibuprofen 600-800 mg TID with meals for 7-10 days, then as necessary. Take ES Tylenol or pain medication as prescribed when needed.  Return for follow-up in 10-14 days or as scheduled.  AMBULATORY SURGERY  DISCHARGE INSTRUCTIONS   5) The drugs that you were given will stay in your system until tomorrow so for the next 24 hours you should not:  D) Drive an automobile E) Make any legal decisions F) Drink any alcoholic beverage   6) You may resume regular meals tomorrow.  Today it is better to start with liquids and gradually work up to solid foods.  You may eat anything you prefer, but it is better to start with liquids, then soup and crackers, and gradually work up to solid foods.   7) Please notify your  doctor immediately if you have any unusual bleeding, trouble breathing, redness and pain at the surgery site, drainage, fever, or pain not relieved by medication.    8) Additional Instructions:        Please contact your physician with any problems or Same Day Surgery at [phone], Monday through Friday 6 am to 4 pm, or [place] at [facility] number at [phone].

## 2019-01-02 NOTE — Anesthesia Preprocedure Evaluation (Signed)
Anesthesia Evaluation  Patient identified by MRN, date of birth, ID band Patient awake    Reviewed: Allergy & Precautions, H&P , NPO status , Patient's Chart, lab work & pertinent test results  History of Anesthesia Complications Negative for: history of anesthetic complications  Airway Mallampati: III  TM Distance: <3 FB Neck ROM: full    Dental  (+) Chipped   Pulmonary neg shortness of breath, asthma , sleep apnea and Continuous Positive Airway Pressure Ventilation ,           Cardiovascular Exercise Tolerance: Good hypertension, (-) angina(-) Past MI and (-) DOE      Neuro/Psych PSYCHIATRIC DISORDERS  Neuromuscular disease    GI/Hepatic Neg liver ROS, GERD  Medicated and Controlled,  Endo/Other  diabetes, Type 2  Renal/GU      Musculoskeletal   Abdominal   Peds  Hematology negative hematology ROS (+)   Anesthesia Other Findings Past Medical History: No date: Allergy No date: Arthritis No date: Asthma     Comment:  mild No date: Carpal tunnel syndrome     Comment:  bilateral No date: Depression No date: Diabetes mellitus without complication (HCC)     Comment:  Type II No date: GERD (gastroesophageal reflux disease) No date: Hyperlipidemia No date: Hypertension     Comment:  neg stress test 10 yrs ago - Dr Humphrey Rolls No date: Sleep apnea     Comment:  has BiPAP -doesn't use No date: Varicose vein     Comment:  treated by Dr. Delana Meyer  Past Surgical History: No date: CHOLECYSTECTOMY 10/05/09: CHONDROPLASTY     Comment:  knee- shaving patellofemoral joint 2009?: COLONOSCOPY No date: ENDOVENOUS ABLATION SAPHENOUS VEIN W/ LASER No date: JOINT REPLACEMENT; Left     Comment:  hip 07/25/2017: KNEE ARTHROSCOPY; Left     Comment:  Procedure: Arthroscopic partial medial meniscectomy;               arthroscopic debridement; and arthroscopic abrasion               chondroplasty of medial femoral condyle, lateral  tibial               plateau, and femoral trochlea; left knee.;  Surgeon:               Corky Mull, MD;  Location: [place];                Service: Orthopedics;  Laterality: Left; No date: KNEE ARTHROSCOPY WITH MEDIAL MENISECTOMY; Right     Comment:  Partial medial and lateral menisectomy 08/10/2014: SHOULDER ARTHROSCOPY; Left     Comment:  Procedure: ARTHROSCOPY SHOULDER WITH LIMITED               DEBRIDEMENT, REMOVAL OF LOOSE BODY AND RELEASE OF LONG               END BICEPS TENDON;  Surgeon: Leanor Kail, MD;                Location: [place];  Service: Orthopedics;                Laterality: Left; 01/25/2016: TOTAL HIP ARTHROPLASTY; Left     Comment:  Procedure: TOTAL HIP ARTHROPLASTY;  Surgeon: Corky Mull, MD;  Location: [facility];  Service: Orthopedics;                Laterality:  Left;  BMI    Body Mass Index: 34.70 kg/m      Reproductive/Obstetrics negative OB ROS                             Anesthesia Physical Anesthesia Plan  ASA: III  Anesthesia Plan: General LMA   Post-op Pain Management:    Induction: Intravenous  PONV Risk Score and Plan: Dexamethasone, Ondansetron, Midazolam and Treatment may vary due to age or medical condition  Airway Management Planned: LMA  Additional Equipment:   Intra-op Plan:   Post-operative Plan: Extubation in OR  Informed Consent: I have reviewed the patients History and Physical, chart, labs and discussed the procedure including the risks, benefits and alternatives for the proposed anesthesia with the patient or authorized representative who has indicated his/her understanding and acceptance.     Dental Advisory Given  Plan Discussed with: Anesthesiologist, CRNA and Surgeon  Anesthesia Plan Comments: (Patient consented for risks of anesthesia including but not limited to:  - adverse reactions to medications - damage to teeth, lips or other oral mucosa -  sore throat or hoarseness - Damage to heart, brain, lungs or loss of life  Patient voiced understanding.)        Anesthesia Quick Evaluation

## 2019-01-02 NOTE — H&P (Signed)
Paper H&P to be scanned into permanent record. H&P reviewed and patient re-examined. No changes. 

## 2019-01-02 NOTE — Anesthesia Post-op Follow-up Note (Signed)
Anesthesia QCDR form completed.        

## 2019-01-02 NOTE — Anesthesia Postprocedure Evaluation (Signed)
Anesthesia Post Note  Patient: Raymond [name]  Procedure(s) Performed: CARPAL TUNNEL RELEASE ENDOSCOPIC (Right Wrist)  Patient location during evaluation: PACU Anesthesia Type: General Level of consciousness: awake and alert Pain management: pain level controlled Vital Signs Assessment: post-procedure vital signs reviewed and stable Respiratory status: spontaneous breathing, nonlabored ventilation, respiratory function stable and patient connected to nasal cannula oxygen Cardiovascular status: blood pressure returned to baseline and stable Postop Assessment: no apparent nausea or vomiting Anesthetic complications: no     Last Vitals:  Vitals:   01/02/19 0849 01/02/19 0900  BP: (!) 137/92 139/70  Pulse: 65 65  Resp: 20 16  Temp: 36.9 C 36.6 C  SpO2: 98% 95%    Last Pain:  Vitals:   01/02/19 0900  TempSrc: Temporal  PainSc: 0-No pain                 Precious Haws Antwonette Feliz

## 2019-01-02 NOTE — Transfer of Care (Signed)
Immediate Anesthesia Transfer of Care Note  Patient: RODRICUS DEDRICK  Procedure(s) Performed: CARPAL TUNNEL RELEASE ENDOSCOPIC (Right Wrist)  Patient Location: PACU  Anesthesia Type:General  Level of Consciousness: drowsy  Airway & Oxygen Therapy: Patient Spontanous Breathing and Patient connected to nasal cannula oxygen  Post-op Assessment: Report given to RN and Post -op Vital signs reviewed and stable  Post vital signs: Reviewed and stable  Last Vitals:  Vitals Value Taken Time  BP 119/77 01/02/19 0819  Temp    Pulse 57 01/02/19 0820  Resp 14 01/02/19 0820  SpO2 94 % 01/02/19 0820  Vitals shown include unvalidated device data.  Last Pain:  Vitals:   01/02/19 0819  TempSrc:   PainSc: (P) Asleep         Complications: No apparent anesthesia complications

## 2019-01-02 NOTE — Anesthesia Procedure Notes (Signed)
Procedure Name: LMA Insertion Date/Time: 01/02/2019 7:38 AM Performed by: Eben Burow, CRNA Pre-anesthesia Checklist: Patient identified, Emergency Drugs available, Suction available and Patient being monitored Patient Re-evaluated:Patient Re-evaluated prior to induction Oxygen Delivery Method: Circle system utilized Preoxygenation: Pre-oxygenation with 100% oxygen Induction Type: IV induction Ventilation: Mask ventilation without difficulty LMA: LMA inserted LMA Size: 5.0 Number of attempts: 1 Placement Confirmation: positive ETCO2 and breath sounds checked- equal and bilateral Tube secured with: Tape Dental Injury: Teeth and Oropharynx as per pre-operative assessment

## 2019-01-02 NOTE — Op Note (Signed)
01/02/2019  8:26 AM  Patient:   Raymond [name]  Pre-Op Diagnosis:   Right carpal tunnel syndrome.  Post-Op Diagnosis:   Same.  Procedure:   Endoscopic right carpal tunnel release.  Surgeon:   Pascal Lux, MD  Assistant:   Larrie Kass, PA-S  Anesthesia:   General LMA  Findings:   As above.  Complications:   None  EBL:   0 cc  Fluids:   400 cc crystalloid  TT:   15 minutes at 250 mmHg  Drains:   None  Closure:   4-0 Prolene interrupted sutures  Brief Clinical Note:   The patient is a 63 year old male with a long history of progressively worsening pain and paresthesias to his right hand. His symptoms have progressed despite medications, activity modification, etc. His history and examination are consistent with carpal tunnel syndrome, confirmed by EMG. The patient presents at this time for an endoscopic right carpal tunnel release.   Procedure:   The patient was brought into the operating room and lain in the supine position. After adequate general laryngeal mask anesthesia was obtained, the right hand and upper extremity were prepped with ChloraPrep solution before being draped sterilely. Preoperative antibiotics were administered. A timeout was performed to verify the appropriate surgical site before the limb was exsanguinated with an Esmarch and the tourniquet inflated to 250 mmHg. An approximately 1.5-2 cm incision was made over the volar wrist flexion crease, centered over the palmaris longus tendon. The incision was carried down through the subcutaneous tissues with care taken to identify and protect any neurovascular structures. The distal forearm fascia was penetrated just proximal to the transverse carpal ligament. The soft tissues were released off the superficial and deep surfaces of the distal forearm fascia and this was released proximally for 3-4 cm under direct visualization.  Attention was directed distally. The Soil scientist was passed beneath the transverse  carpal ligament along the ulnar aspect of the carpal tunnel and used to release any adhesions as well as to remove any adherent synovial tissue before first the smaller then the larger of the two dilators were passed beneath the transverse carpal ligament along the ulnar margin of the carpal tunnel. The slotted cannula was introduced and the endoscope was placed into the slotted cannula and the undersurface of the transverse carpal ligament visualized. The distal margin of the transverse carpal ligament was marked by placing a 25-gauge needle percutaneously at [place] cardinal point so that it entered the distal portion of the slotted cannula. Under endoscopic visualization, the transverse carpal ligament was released from proximal to distal using the end-cutting blade. A second pass was performed to ensure complete release of the ligament. The adequacy of release was verified both endoscopically and by palpation using the freer elevator.  The wound was irrigated thoroughly with sterile saline solution before being closed using 4-0 Prolene interrupted sutures. A total of 10 cc of 0.5% plain Sensorcaine was injected in and around the incision before a sterile bulky dressing was applied to the wound. The patient was placed into a volar wrist splint before being awakened and returned to the recovery room in satisfactory condition after tolerating the procedure well.

## 2019-01-03 ENCOUNTER — Encounter: Payer: Self-pay | Admitting: Surgery

## 2019-02-27 ENCOUNTER — Other Ambulatory Visit: Payer: Self-pay

## 2019-02-27 ENCOUNTER — Encounter (INDEPENDENT_AMBULATORY_CARE_PROVIDER_SITE_OTHER): Payer: Self-pay | Admitting: Vascular Surgery

## 2019-02-27 ENCOUNTER — Ambulatory Visit (INDEPENDENT_AMBULATORY_CARE_PROVIDER_SITE_OTHER): Payer: BC Managed Care – PPO | Admitting: Vascular Surgery

## 2019-02-27 VITALS — BP 128/78 | HR 66 | Resp 16 | Ht 73.0 in | Wt 257.0 lb

## 2019-02-27 DIAGNOSIS — I83812 Varicose veins of left lower extremities with pain: Secondary | ICD-10-CM | POA: Diagnosis not present

## 2019-02-27 NOTE — Progress Notes (Signed)
    MRN : [id number]  DRAXTON [name] is a 64 y.o. (04-19-1955) male who presents with chief complaint of painful varicose veins.    The patient's left lower extremity was sterilely prepped and draped.  The ultrasound machine was used to visualize the left great saphenous vein throughout its course.  A segment of the great saphenous vein at the knee was selected for access.  The saphenous vein was accessed without difficulty using ultrasound guidance with a micropuncture needle.   An 0.018  wire was placed beyond the saphenofemoral junction through the sheath and the microneedle was removed.  The 65 cm sheath was then placed over the wire and the wire and dilator were removed.  The laser fiber was placed through the sheath and its tip was placed approximately 2 cm below the saphenofemoral junction.  Tumescent anesthesia was then created with a dilute lidocaine solution.  Laser energy was then delivered with constant withdrawal of the sheath and laser fiber.  Approximately 2136 joules of energy were delivered over a length of 37 cm.  Sterile dressings were placed.  The patient tolerated the procedure well without complications.

## 2019-03-03 ENCOUNTER — Other Ambulatory Visit (INDEPENDENT_AMBULATORY_CARE_PROVIDER_SITE_OTHER): Payer: Self-pay | Admitting: Vascular Surgery

## 2019-03-03 ENCOUNTER — Ambulatory Visit (INDEPENDENT_AMBULATORY_CARE_PROVIDER_SITE_OTHER): Payer: BC Managed Care – PPO

## 2019-03-03 ENCOUNTER — Other Ambulatory Visit: Payer: Self-pay

## 2019-03-03 DIAGNOSIS — I83812 Varicose veins of left lower extremities with pain: Secondary | ICD-10-CM | POA: Diagnosis not present

## 2019-03-14 ENCOUNTER — Telehealth: Payer: Self-pay | Admitting: Family Medicine

## 2019-03-14 DIAGNOSIS — R42 Dizziness and giddiness: Secondary | ICD-10-CM | POA: Diagnosis not present

## 2019-03-14 DIAGNOSIS — H9312 Tinnitus, left ear: Secondary | ICD-10-CM | POA: Diagnosis not present

## 2019-03-14 DIAGNOSIS — H903 Sensorineural hearing loss, bilateral: Secondary | ICD-10-CM | POA: Diagnosis not present

## 2019-03-14 NOTE — Telephone Encounter (Signed)
Hey if he can't walk he needs to go to urgent care or even ER.

## 2019-03-14 NOTE — Telephone Encounter (Signed)
Patient lost hearing in left ear and is not experiencing nausea, head is spinning, no balance and could not walk.

## 2019-03-14 NOTE — Telephone Encounter (Signed)
Informed patient through voicemail to go to ER.

## 2019-03-20 ENCOUNTER — Other Ambulatory Visit (HOSPITAL_COMMUNITY): Payer: Self-pay | Admitting: Otolaryngology

## 2019-03-20 ENCOUNTER — Other Ambulatory Visit: Payer: Self-pay | Admitting: Otolaryngology

## 2019-03-20 ENCOUNTER — Ambulatory Visit
Admission: RE | Admit: 2019-03-20 | Discharge: 2019-03-20 | Disposition: A | Discharge status: Home / Self Care | Payer: BC Managed Care – PPO | Source: Ambulatory Visit | Attending: Otolaryngology | Admitting: Otolaryngology

## 2019-03-20 ENCOUNTER — Other Ambulatory Visit: Payer: Self-pay

## 2019-03-20 DIAGNOSIS — R110 Nausea: Secondary | ICD-10-CM | POA: Diagnosis not present

## 2019-03-20 DIAGNOSIS — H903 Sensorineural hearing loss, bilateral: Secondary | ICD-10-CM | POA: Diagnosis not present

## 2019-03-20 DIAGNOSIS — R42 Dizziness and giddiness: Secondary | ICD-10-CM

## 2019-03-20 DIAGNOSIS — H90A22 Sensorineural hearing loss, unilateral, left ear, with restricted hearing on the contralateral side: Secondary | ICD-10-CM | POA: Diagnosis not present

## 2019-03-20 DIAGNOSIS — H9122 Sudden idiopathic hearing loss, left ear: Secondary | ICD-10-CM | POA: Diagnosis not present

## 2019-03-20 LAB — POCT I-STAT CREATININE: Creatinine, Ser: 1.3 mg/dL — ABNORMAL HIGH (ref 0.61–1.24)

## 2019-03-20 MED ORDER — GADOBUTROL 1 MMOL/ML IV SOLN
10.0000 mL | Freq: Once | INTRAVENOUS | Status: AC | PRN
  Administered 2019-03-20: 16:00:00 10 mL via INTRAVENOUS

## 2019-03-24 DIAGNOSIS — E119 Type 2 diabetes mellitus without complications: Secondary | ICD-10-CM | POA: Diagnosis not present

## 2019-03-26 ENCOUNTER — Ambulatory Visit (INDEPENDENT_AMBULATORY_CARE_PROVIDER_SITE_OTHER): Payer: BC Managed Care – PPO

## 2019-03-26 ENCOUNTER — Other Ambulatory Visit (INDEPENDENT_AMBULATORY_CARE_PROVIDER_SITE_OTHER): Payer: Self-pay | Admitting: Otolaryngology

## 2019-03-26 ENCOUNTER — Other Ambulatory Visit: Payer: Self-pay

## 2019-03-26 DIAGNOSIS — R42 Dizziness and giddiness: Secondary | ICD-10-CM | POA: Diagnosis not present

## 2019-03-26 DIAGNOSIS — H9122 Sudden idiopathic hearing loss, left ear: Secondary | ICD-10-CM | POA: Diagnosis not present

## 2019-04-03 DIAGNOSIS — H9122 Sudden idiopathic hearing loss, left ear: Secondary | ICD-10-CM | POA: Diagnosis not present

## 2019-04-11 DIAGNOSIS — H903 Sensorineural hearing loss, bilateral: Secondary | ICD-10-CM | POA: Diagnosis not present

## 2019-04-11 DIAGNOSIS — H9122 Sudden idiopathic hearing loss, left ear: Secondary | ICD-10-CM | POA: Diagnosis not present

## 2019-04-21 ENCOUNTER — Other Ambulatory Visit: Payer: Self-pay | Admitting: Family Medicine

## 2019-04-21 DIAGNOSIS — E119 Type 2 diabetes mellitus without complications: Secondary | ICD-10-CM | POA: Diagnosis not present

## 2019-04-21 DIAGNOSIS — G6189 Other inflammatory polyneuropathies: Secondary | ICD-10-CM

## 2019-04-30 ENCOUNTER — Encounter: Payer: Self-pay | Admitting: Family Medicine

## 2019-04-30 ENCOUNTER — Ambulatory Visit (INDEPENDENT_AMBULATORY_CARE_PROVIDER_SITE_OTHER): Payer: BC Managed Care – PPO | Admitting: Family Medicine

## 2019-04-30 ENCOUNTER — Other Ambulatory Visit: Payer: Self-pay

## 2019-04-30 VITALS — BP 120/70 | HR 68 | Ht 73.0 in | Wt 255.0 lb

## 2019-04-30 DIAGNOSIS — E119 Type 2 diabetes mellitus without complications: Secondary | ICD-10-CM | POA: Diagnosis not present

## 2019-04-30 DIAGNOSIS — R69 Illness, unspecified: Secondary | ICD-10-CM | POA: Diagnosis not present

## 2019-04-30 NOTE — Progress Notes (Signed)
Date:  04/30/2019   Name:  Raymond [name]   DOB:  1955/02/28   MRN:  [id number]   Chief Complaint: Diabetes  Diabetes He presents for his follow-up diabetic visit. He has type 2 diabetes mellitus. His disease course has been stable. There are no hypoglycemic associated symptoms. Pertinent negatives for hypoglycemia include no dizziness, headaches or nervousness/anxiousness. There are no diabetic associated symptoms. Pertinent negatives for diabetes include no blurred vision, no chest pain, no fatigue, no foot paresthesias, no foot ulcerations, no polydipsia, no polyphagia, no polyuria, no visual change, no weakness and no weight loss. There are no hypoglycemic complications. Symptoms are stable. There are no diabetic complications. There are no known risk factors for coronary artery disease. Current diabetic treatment includes oral agent (dual therapy). He is compliant with treatment some of the time. His weight is stable. He is following a generally healthy diet. Meal planning includes avoidance of concentrated sweets and carbohydrate counting. He participates in exercise intermittently. His home blood glucose trend is fluctuating minimally. His breakfast blood glucose is taken between 8-9 am. His breakfast blood glucose range is generally >200 mg/dl. An ACE inhibitor/angiotensin II receptor blocker is being taken.    Lab Results  Component Value Date   CREATININE 1.30 (H) 03/20/2019   BUN 14 11/07/2018   NA 139 11/07/2018   K 4.0 11/07/2018   CL 98 11/07/2018   CO2 24 11/07/2018   Lab Results  Component Value Date   CHOL 200 (H) 11/07/2018   HDL 51 11/07/2018   LDLCALC 127 (H) 11/07/2018   TRIG 126 11/07/2018   CHOLHDL 3.3 07/24/2016   Lab Results  Component Value Date   TSH 2.620 11/07/2018   Lab Results  Component Value Date   HGBA1C 6.8 (H) 11/07/2018     Review of Systems  Constitutional: Negative for chills, fatigue, fever and weight loss.  HENT: Negative for  drooling, ear discharge, ear pain, postnasal drip and sore throat.   Eyes: Negative for blurred vision.  Respiratory: Negative for cough, shortness of breath and wheezing.   Cardiovascular: Negative for chest pain, palpitations and leg swelling.  Gastrointestinal: Negative for abdominal pain, blood in stool, constipation, diarrhea and nausea.  Endocrine: Negative for polydipsia, polyphagia and polyuria.  Genitourinary: Negative for dysuria, frequency, hematuria and urgency.  Musculoskeletal: Negative for back pain, myalgias and neck pain.  Skin: Negative for rash.  Allergic/Immunologic: Negative for environmental allergies.  Neurological: Negative for dizziness, weakness and headaches.  Hematological: Does not bruise/bleed easily.  Psychiatric/Behavioral: Negative for suicidal ideas. The patient is not nervous/anxious.     Patient Active Problem List   Diagnosis Date Noted  . Varicose veins of leg with pain, left 12/30/2018  . Carpal tunnel syndrome, right 08/09/2018  . Primary osteoarthritis of left shoulder 08/09/2018  . Rotator cuff tendinitis, left 08/09/2018  . Strain of left knee 08/06/2017  . Obesity (BMI 35.0-39.9 without comorbidity) 08/03/2017  . Status post arthroscopy of left knee 08/03/2017  . Complex tear of medial meniscus of left knee as current injury 07/26/2017  . Chest pain 07/24/2016  . Uncontrolled type 2 diabetes mellitus without complication, without long-term current use of insulin 07/24/2016  . Status post total hip replacement, left 01/25/2016  . Benign essential HTN 07/21/2015  . Depression 07/21/2015  . Hyperlipidemia, mixed 07/21/2015  . GERD (gastroesophageal reflux disease) 12/04/2014  . Biceps tendinitis 07/21/2014  . Impingement syndrome of shoulder 07/21/2014  . Primary osteoarthritis of right knee 08/14/2013  .  Neuritis or radiculitis due to rupture of lumbar intervertebral disc 08/14/2013    No Known Allergies  Past Surgical History:    Procedure Laterality Date  . CARPAL TUNNEL RELEASE Right 01/02/2019   Procedure: CARPAL TUNNEL RELEASE ENDOSCOPIC;  Surgeon: Corky Mull, MD;  Location: [facility];  Service: Orthopedics;  Laterality: Right;  . CHOLECYSTECTOMY    . CHONDROPLASTY  10/05/09   knee- shaving patellofemoral joint  . COLONOSCOPY  2009?  . ENDOVENOUS ABLATION SAPHENOUS VEIN W/ LASER    . JOINT REPLACEMENT Left    hip  . KNEE ARTHROSCOPY Left 07/25/2017   Procedure: Arthroscopic partial medial meniscectomy; arthroscopic debridement; and arthroscopic abrasion chondroplasty of medial femoral condyle, lateral tibial plateau, and femoral trochlea; left knee.;  Surgeon: Corky Mull, MD;  Location: [place];  Service: Orthopedics;  Laterality: Left;  . KNEE ARTHROSCOPY WITH MEDIAL MENISECTOMY Right    Partial medial and lateral menisectomy  . SHOULDER ARTHROSCOPY Left 08/10/2014   Procedure: ARTHROSCOPY SHOULDER WITH LIMITED DEBRIDEMENT, REMOVAL OF LOOSE BODY AND RELEASE OF LONG END BICEPS TENDON;  Surgeon: Leanor Kail, MD;  Location: [place];  Service: Orthopedics;  Laterality: Left;  . TOTAL HIP ARTHROPLASTY Left 01/25/2016   Procedure: TOTAL HIP ARTHROPLASTY;  Surgeon: Corky Mull, MD;  Location: [facility];  Service: Orthopedics;  Laterality: Left;    Social History   Tobacco Use  . Smoking status: Never Smoker  . Smokeless tobacco: Never Used  Substance Use Topics  . Alcohol use: Yes    Comment: rare  . Drug use: No     Medication list has been reviewed and updated.  Current Meds  Medication Sig  . ACCU-CHEK AVIVA PLUS test strip TEST AS DIRECTED  . amLODipine (NORVASC) 10 MG tablet Take 1 tablet (10 mg total) by mouth daily.  Marland Kitchen aspirin 81 MG tablet Take 1 tablet (81 mg total) by mouth daily. AM  . buPROPion (WELLBUTRIN) 75 MG tablet Take 1 tablet (75 mg total) by mouth 2 (two) times daily.  . Cholecalciferol 1000 UNITS capsule Take 1,000 Units by mouth daily. AM  .  escitalopram (LEXAPRO) 20 MG tablet Take 20 mg by mouth daily.  Marland Kitchen esomeprazole (NEXIUM) 40 MG capsule TAKE 1 CAPSULE BY MOUTH EVERY DAY (Patient taking differently: Take 40 mg by mouth daily. )  . gabapentin (NEURONTIN) 300 MG capsule 3 capsules tid (Patient taking differently: Take 300 mg by mouth 3 (three) times daily. )  . glipiZIDE (GLUCOTROL) 5 MG tablet TAKE 1 TABLET BY MOUTH 2 TIMES DAILY BEFORE A MEAL  . glucosamine-chondroitin 500-400 MG tablet Take 1 tablet by mouth 3 (three) times daily.  Marland Kitchen losartan (COZAAR) 100 MG tablet Take 0.5 tablets (50 mg total) by mouth daily.  . meloxicam (MOBIC) 15 MG tablet Take 15 mg by mouth daily. [name]  . metFORMIN (GLUCOPHAGE) 500 MG tablet TAKE 1 TABLET BY MOUTH TWICE A DAY WITH MEALS (Patient taking differently: Take 500 mg by mouth 2 (two) times daily with a meal. )  . omega-3 acid ethyl esters (LOVAZA) 1 g capsule Take 1 g by mouth daily.   . sertraline (ZOLOFT) 50 MG tablet Take 1 tablet (50 mg total) by mouth daily.  . simvastatin (ZOCOR) 40 MG tablet 1 tablet daily (Patient taking differently: Take 40 mg by mouth daily. )  . tiZANidine (ZANAFLEX) 2 MG tablet Take 2 mg by mouth at bedtime.  . triamterene-hydrochlorothiazide (DYAZIDE) 37.5-25 MG capsule Take 1 capsule by mouth daily.  PHQ 2/9 Scores 04/30/2019 11/07/2018 04/09/2018 03/04/2018  PHQ - 2 Score 0 0 0 0  PHQ- 9 Score 0 1 0 1    BP Readings from Last 3 Encounters:  04/30/19 120/70  02/27/19 128/78  01/02/19 139/70    Physical Exam Vitals and nursing note reviewed.  HENT:     Head: Normocephalic.     Right Ear: Tympanic membrane, ear canal and external ear normal.     Left Ear: Tympanic membrane, ear canal and external ear normal.     Nose: Nose normal. No congestion or rhinorrhea.     Mouth/Throat:     Mouth: Mucous membranes are moist.  Eyes:     General: No scleral icterus.       Right eye: No discharge.        Left eye: No discharge.     Conjunctiva/sclera:  Conjunctivae normal.     Pupils: Pupils are equal, round, and reactive to light.  Neck:     Thyroid: No thyromegaly.     Vascular: No JVD.     Trachea: No tracheal deviation.  Cardiovascular:     Rate and Rhythm: Normal rate and regular rhythm.     Heart sounds: Normal heart sounds. No murmur. No friction rub. No gallop.   Pulmonary:     Effort: No respiratory distress.     Breath sounds: Normal breath sounds. No wheezing, rhonchi or rales.  Abdominal:     General: Bowel sounds are normal.     Palpations: Abdomen is soft. There is no mass.     Tenderness: There is no abdominal tenderness. There is no guarding or rebound.  Musculoskeletal:        General: No tenderness. Normal range of motion.     Cervical back: Normal range of motion and neck supple.  Lymphadenopathy:     Cervical: No cervical adenopathy.  Skin:    General: Skin is warm.     Findings: No rash.  Neurological:     Mental Status: He is alert and oriented to person, place, and time.     Cranial Nerves: No cranial nerve deficit.     Deep Tendon Reflexes: Reflexes are normal and symmetric.     Wt Readings from Last 3 Encounters:  04/30/19 255 lb (115.7 kg)  02/27/19 257 lb (116.6 kg)  01/02/19 263 lb (119.3 kg)    BP 120/70   Pulse 68   Ht 6\' 1"  (1.854 m)   Wt 255 lb (115.7 kg)   BMI 33.64 kg/m    Assessment and Plan: 1. Taking medication for chronic disease Patient on medication which may affect GFR and potassium particularly the potassium sparing diuretic which was given to him by ear nose and throat.  Will check basic metabolic panel and potassium level. - Basic metabolic panel - Potassium  2. Type 2 diabetes mellitus without complication, without long-term current use of insulin (HCC) Chronic.  Controlled.  Stable.  Patient is currently on combination of glipizide 5 mg twice a day and Metformin 500 mg twice a day.  Patient had a fasting glucose this morning of over 200.  This is likely suggestive that  he is not can be controlled with this and has not been able to afford the Jardiance.  We will refer to endocrinology pending hemoglobin A1c. - Hemoglobin [id number] - Basic metabolic panel

## 2019-05-01 ENCOUNTER — Other Ambulatory Visit: Payer: Self-pay

## 2019-05-01 ENCOUNTER — Encounter: Payer: Self-pay | Admitting: Family Medicine

## 2019-05-01 DIAGNOSIS — Z8673 Personal history of transient ischemic attack (TIA), and cerebral infarction without residual deficits: Secondary | ICD-10-CM | POA: Diagnosis not present

## 2019-05-01 DIAGNOSIS — G939 Disorder of brain, unspecified: Secondary | ICD-10-CM | POA: Diagnosis not present

## 2019-05-01 DIAGNOSIS — E559 Vitamin D deficiency, unspecified: Secondary | ICD-10-CM | POA: Diagnosis not present

## 2019-05-01 DIAGNOSIS — E119 Type 2 diabetes mellitus without complications: Secondary | ICD-10-CM

## 2019-05-01 DIAGNOSIS — E538 Deficiency of other specified B group vitamins: Secondary | ICD-10-CM | POA: Diagnosis not present

## 2019-05-01 DIAGNOSIS — R0789 Other chest pain: Secondary | ICD-10-CM | POA: Diagnosis not present

## 2019-05-01 DIAGNOSIS — M792 Neuralgia and neuritis, unspecified: Secondary | ICD-10-CM | POA: Diagnosis not present

## 2019-05-01 LAB — HEMOGLOBIN A1C
Est. average glucose Bld gHb Est-mCnc: 220 mg/dL
Hgb A1c MFr Bld: 9.3 % — ABNORMAL HIGH (ref 4.8–5.6)

## 2019-05-01 LAB — BASIC METABOLIC PANEL
BUN/Creatinine Ratio: 23 (ref 10–24)
BUN: 20 mg/dL (ref 8–27)
CO2: 23 mmol/L (ref 20–29)
Calcium: 10 mg/dL (ref 8.6–10.2)
Chloride: 97 mmol/L (ref 96–106)
Creatinine, Ser: 0.86 mg/dL (ref 0.76–1.27)
GFR calc Af Amer: 106 mL/min/{1.73_m2} (ref >59)
GFR calc non Af Amer: 92 mL/min/{1.73_m2} (ref >59)
Glucose: 246 mg/dL — ABNORMAL HIGH (ref 65–99)
Potassium: 4.1 mmol/L (ref 3.5–5.2)
Sodium: 138 mmol/L (ref 134–144)

## 2019-05-01 NOTE — Progress Notes (Unsigned)
Ref placed.

## 2019-05-14 DIAGNOSIS — E782 Mixed hyperlipidemia: Secondary | ICD-10-CM | POA: Diagnosis not present

## 2019-05-14 DIAGNOSIS — I10 Essential (primary) hypertension: Secondary | ICD-10-CM | POA: Diagnosis not present

## 2019-05-14 DIAGNOSIS — I679 Cerebrovascular disease, unspecified: Secondary | ICD-10-CM | POA: Diagnosis not present

## 2019-05-14 DIAGNOSIS — I639 Cerebral infarction, unspecified: Secondary | ICD-10-CM | POA: Diagnosis not present

## 2019-05-14 DIAGNOSIS — I6523 Occlusion and stenosis of bilateral carotid arteries: Secondary | ICD-10-CM | POA: Diagnosis not present

## 2019-05-15 ENCOUNTER — Ambulatory Visit: Payer: BC Managed Care – PPO | Attending: Internal Medicine

## 2019-05-15 DIAGNOSIS — Z23 Encounter for immunization: Secondary | ICD-10-CM

## 2019-05-15 NOTE — Progress Notes (Signed)
   Covid-19 Vaccination Clinic  Name:  Raymond [name]    MRN: [id number] DOB: Apr 21, 1955  05/15/2019  [name] was observed post Covid-19 immunization for 15 minutes without incident. He was provided with Vaccine Information Sheet and instruction to access the V-Safe system.   [name] was instructed to call 911 with any severe reactions post vaccine: Marland Kitchen Difficulty breathing  . Swelling of face and throat  . A fast heartbeat  . A bad rash all over body  . Dizziness and weakness   Immunizations Administered    Name Date Dose VIS Date Route   Pfizer COVID-19 Vaccine 05/15/2019  8:56 AM 0.3 mL 01/31/2019 Intramuscular   Manufacturer: [place]   Lot: [id number]   [place]: [id number]

## 2019-05-21 ENCOUNTER — Other Ambulatory Visit: Payer: Self-pay | Admitting: Family Medicine

## 2019-05-21 DIAGNOSIS — E119 Type 2 diabetes mellitus without complications: Secondary | ICD-10-CM

## 2019-05-22 DIAGNOSIS — E119 Type 2 diabetes mellitus without complications: Secondary | ICD-10-CM | POA: Diagnosis not present

## 2019-05-23 ENCOUNTER — Other Ambulatory Visit: Payer: Self-pay | Admitting: Family Medicine

## 2019-05-23 DIAGNOSIS — I10 Essential (primary) hypertension: Secondary | ICD-10-CM

## 2019-05-27 DIAGNOSIS — M79601 Pain in right arm: Secondary | ICD-10-CM | POA: Diagnosis not present

## 2019-06-10 ENCOUNTER — Ambulatory Visit: Payer: BC Managed Care – PPO | Attending: Internal Medicine

## 2019-06-10 DIAGNOSIS — Z23 Encounter for immunization: Secondary | ICD-10-CM

## 2019-06-10 NOTE — Progress Notes (Signed)
   Covid-19 Vaccination Clinic  Name:  Raymond [name]    MRN: [id number] DOB: 04-18-1955  06/10/2019  [name] was observed post Covid-19 immunization for 15 minutes without incident. He was provided with Vaccine Information Sheet and instruction to access the V-Safe system.   [name] was instructed to call 911 with any severe reactions post vaccine: Marland Kitchen Difficulty breathing  . Swelling of face and throat  . A fast heartbeat  . A bad rash all over body  . Dizziness and weakness   Immunizations Administered    Name Date Dose VIS Date Route   Pfizer COVID-19 Vaccine 06/10/2019  8:36 AM 0.3 mL 04/16/2018 Intramuscular   Manufacturer: [employer]   Lot: [id number]   [place]: [id number]

## 2019-06-14 ENCOUNTER — Other Ambulatory Visit: Payer: Self-pay | Admitting: Family Medicine

## 2019-06-14 DIAGNOSIS — E119 Type 2 diabetes mellitus without complications: Secondary | ICD-10-CM

## 2019-06-14 NOTE — Telephone Encounter (Signed)
Requested Prescriptions  Pending Prescriptions Disp Refills  . metFORMIN (GLUCOPHAGE) 500 MG tablet [Pharmacy Med Name: METFORMIN HCL 500 MG TABLET] 180 tablet 1    Sig: TAKE 1 TABLET (500 MG TOTAL) BY MOUTH 2 (TWO) TIMES DAILY WITH A MEAL.     Endocrinology:  Diabetes - Biguanides Failed - 06/14/2019 10:31 AM      Failed - HBA1C is between 0 and 7.9 and within 180 days    Hgb A1c MFr Bld  Date Value Ref Range Status  04/30/2019 9.3 (H) 4.8 - 5.6 % Final    Comment:             Prediabetes: 5.7 - 6.4          Diabetes: >6.4          Glycemic control for adults with diabetes: <7.0          Passed - Cr in normal range and within 360 days    Creatinine, Ser  Date Value Ref Range Status  04/30/2019 0.86 0.76 - 1.27 mg/dL Final         Passed - eGFR in normal range and within 360 days    GFR calc Af Amer  Date Value Ref Range Status  04/30/2019 106 >59 mL/min/1.73 Final   GFR calc non Af Amer  Date Value Ref Range Status  04/30/2019 92 >59 mL/min/1.73 Final         Passed - Valid encounter within last 6 months    Recent Outpatient Visits          1 month ago Taking medication for chronic disease   [facility] Juline Patch, MD   7 months ago Type 2 diabetes mellitus without complication, without long-term current use of insulin ([place])   [facility] Juline Patch, MD   1 year ago Type 2 diabetes mellitus without complication, without long-term current use of insulin ([place])   [facility] Juline Patch, MD   1 year ago History of posttraumatic stress disorder (PTSD)   [facility] Juline Patch, MD   1 year ago Essential hypertension   [facility] Juline Patch, MD

## 2019-06-16 ENCOUNTER — Other Ambulatory Visit: Payer: Self-pay | Admitting: Family Medicine

## 2019-06-16 DIAGNOSIS — F3341 Major depressive disorder, recurrent, in partial remission: Secondary | ICD-10-CM

## 2019-06-16 DIAGNOSIS — Z8659 Personal history of other mental and behavioral disorders: Secondary | ICD-10-CM

## 2019-06-16 DIAGNOSIS — F419 Anxiety disorder, unspecified: Secondary | ICD-10-CM

## 2019-06-16 NOTE — Telephone Encounter (Signed)
Requested Prescriptions  Pending Prescriptions Disp Refills  . sertraline (ZOLOFT) 50 MG tablet [Pharmacy Med Name: SERTRALINE HCL 50 MG TABLET] 90 tablet 1    Sig: TAKE 1 TABLET BY MOUTH EVERY DAY     Psychiatry:  Antidepressants - SSRI Passed - 06/16/2019  8:18 AM      Passed - Completed PHQ-2 or PHQ-9 in the last 360 days.      Passed - Valid encounter within last 6 months    Recent Outpatient Visits          1 month ago Taking medication for chronic disease   [facility] Juline Patch, MD   7 months ago Type 2 diabetes mellitus without complication, without long-term current use of insulin ([place])   [facility] Juline Patch, MD   1 year ago Type 2 diabetes mellitus without complication, without long-term current use of insulin ([place])   [facility] Juline Patch, MD   1 year ago History of posttraumatic stress disorder (PTSD)   [facility] Juline Patch, MD   1 year ago Essential hypertension   [facility] Juline Patch, MD

## 2019-06-21 DIAGNOSIS — E119 Type 2 diabetes mellitus without complications: Secondary | ICD-10-CM | POA: Diagnosis not present

## 2019-07-04 ENCOUNTER — Other Ambulatory Visit: Payer: Self-pay | Admitting: Family Medicine

## 2019-07-04 DIAGNOSIS — E782 Mixed hyperlipidemia: Secondary | ICD-10-CM

## 2019-07-14 DIAGNOSIS — E782 Mixed hyperlipidemia: Secondary | ICD-10-CM | POA: Diagnosis not present

## 2019-07-14 DIAGNOSIS — I679 Cerebrovascular disease, unspecified: Secondary | ICD-10-CM | POA: Diagnosis not present

## 2019-07-14 DIAGNOSIS — I10 Essential (primary) hypertension: Secondary | ICD-10-CM | POA: Diagnosis not present

## 2019-07-14 DIAGNOSIS — G5601 Carpal tunnel syndrome, right upper limb: Secondary | ICD-10-CM | POA: Diagnosis not present

## 2019-07-14 DIAGNOSIS — I6523 Occlusion and stenosis of bilateral carotid arteries: Secondary | ICD-10-CM | POA: Diagnosis not present

## 2019-07-14 DIAGNOSIS — Z79899 Other long term (current) drug therapy: Secondary | ICD-10-CM | POA: Diagnosis not present

## 2019-07-17 DIAGNOSIS — I6523 Occlusion and stenosis of bilateral carotid arteries: Secondary | ICD-10-CM | POA: Diagnosis not present

## 2019-07-17 DIAGNOSIS — E782 Mixed hyperlipidemia: Secondary | ICD-10-CM | POA: Diagnosis not present

## 2019-07-17 DIAGNOSIS — I679 Cerebrovascular disease, unspecified: Secondary | ICD-10-CM | POA: Diagnosis not present

## 2019-07-17 DIAGNOSIS — I10 Essential (primary) hypertension: Secondary | ICD-10-CM | POA: Diagnosis not present

## 2019-07-22 DIAGNOSIS — E119 Type 2 diabetes mellitus without complications: Secondary | ICD-10-CM | POA: Diagnosis not present

## 2019-08-21 DIAGNOSIS — E119 Type 2 diabetes mellitus without complications: Secondary | ICD-10-CM | POA: Diagnosis not present

## 2019-09-03 DIAGNOSIS — M5030 Other cervical disc degeneration, unspecified cervical region: Secondary | ICD-10-CM | POA: Diagnosis not present

## 2019-09-03 DIAGNOSIS — G5601 Carpal tunnel syndrome, right upper limb: Secondary | ICD-10-CM | POA: Diagnosis not present

## 2019-09-03 DIAGNOSIS — M792 Neuralgia and neuritis, unspecified: Secondary | ICD-10-CM | POA: Diagnosis not present

## 2019-09-21 DIAGNOSIS — E119 Type 2 diabetes mellitus without complications: Secondary | ICD-10-CM | POA: Diagnosis not present

## 2019-10-08 ENCOUNTER — Other Ambulatory Visit: Payer: Self-pay | Admitting: Family Medicine

## 2019-10-08 DIAGNOSIS — F3341 Major depressive disorder, recurrent, in partial remission: Secondary | ICD-10-CM

## 2019-10-08 NOTE — Telephone Encounter (Signed)
Requested Prescriptions  Pending Prescriptions Disp Refills  . buPROPion (WELLBUTRIN) 75 MG tablet [Pharmacy Med Name: BUPROPION HCL 75 MG TABLET] 180 tablet 0    Sig: TAKE 1 TABLET BY MOUTH TWICE A DAY     Psychiatry: Antidepressants - bupropion Passed - 10/08/2019  1:21 AM      Passed - Completed PHQ-2 or PHQ-9 in the last 360 days.      Passed - Last BP in normal range    BP Readings from Last 1 Encounters:  04/30/19 120/70         Passed - Valid encounter within last 6 months    Recent Outpatient Visits          5 months ago Taking medication for chronic disease   [facility] Juline Patch, MD   11 months ago Type 2 diabetes mellitus without complication, without long-term current use of insulin ([place])   [facility] Juline Patch, MD   1 year ago Type 2 diabetes mellitus without complication, without long-term current use of insulin ([place])   [facility] Juline Patch, MD   1 year ago History of posttraumatic stress disorder (PTSD)   [facility] Juline Patch, MD   1 year ago Essential hypertension   [facility] Juline Patch, MD

## 2019-10-09 ENCOUNTER — Other Ambulatory Visit: Payer: Self-pay | Admitting: Family Medicine

## 2019-10-09 DIAGNOSIS — I10 Essential (primary) hypertension: Secondary | ICD-10-CM

## 2019-10-14 DIAGNOSIS — S60221A Contusion of right hand, initial encounter: Secondary | ICD-10-CM | POA: Diagnosis not present

## 2019-10-14 DIAGNOSIS — M25541 Pain in joints of right hand: Secondary | ICD-10-CM | POA: Diagnosis not present

## 2019-10-22 DIAGNOSIS — E119 Type 2 diabetes mellitus without complications: Secondary | ICD-10-CM | POA: Diagnosis not present

## 2019-11-21 DIAGNOSIS — E119 Type 2 diabetes mellitus without complications: Secondary | ICD-10-CM | POA: Diagnosis not present

## 2019-11-21 LAB — HM DIABETES EYE EXAM

## 2019-11-26 DIAGNOSIS — M1711 Unilateral primary osteoarthritis, right knee: Secondary | ICD-10-CM | POA: Diagnosis not present

## 2019-11-26 DIAGNOSIS — M47816 Spondylosis without myelopathy or radiculopathy, lumbar region: Secondary | ICD-10-CM | POA: Diagnosis not present

## 2019-11-26 DIAGNOSIS — M25561 Pain in right knee: Secondary | ICD-10-CM | POA: Diagnosis not present

## 2019-11-28 DIAGNOSIS — M48061 Spinal stenosis, lumbar region without neurogenic claudication: Secondary | ICD-10-CM | POA: Diagnosis not present

## 2019-11-28 DIAGNOSIS — G8929 Other chronic pain: Secondary | ICD-10-CM | POA: Diagnosis not present

## 2019-11-28 DIAGNOSIS — M5441 Lumbago with sciatica, right side: Secondary | ICD-10-CM | POA: Diagnosis not present

## 2019-12-01 DIAGNOSIS — M48061 Spinal stenosis, lumbar region without neurogenic claudication: Secondary | ICD-10-CM | POA: Diagnosis not present

## 2019-12-01 DIAGNOSIS — M5441 Lumbago with sciatica, right side: Secondary | ICD-10-CM | POA: Diagnosis not present

## 2019-12-01 DIAGNOSIS — E088 Diabetes mellitus due to underlying condition with unspecified complications: Secondary | ICD-10-CM | POA: Diagnosis not present

## 2019-12-04 DIAGNOSIS — G5602 Carpal tunnel syndrome, left upper limb: Secondary | ICD-10-CM | POA: Diagnosis not present

## 2019-12-09 DIAGNOSIS — E119 Type 2 diabetes mellitus without complications: Secondary | ICD-10-CM | POA: Diagnosis not present

## 2019-12-11 ENCOUNTER — Other Ambulatory Visit: Payer: Self-pay | Admitting: Surgery

## 2019-12-12 ENCOUNTER — Other Ambulatory Visit: Payer: Self-pay

## 2019-12-15 DIAGNOSIS — G8929 Other chronic pain: Secondary | ICD-10-CM | POA: Diagnosis not present

## 2019-12-15 DIAGNOSIS — M5441 Lumbago with sciatica, right side: Secondary | ICD-10-CM | POA: Diagnosis not present

## 2019-12-15 DIAGNOSIS — M48061 Spinal stenosis, lumbar region without neurogenic claudication: Secondary | ICD-10-CM | POA: Diagnosis not present

## 2019-12-16 ENCOUNTER — Other Ambulatory Visit: Payer: Self-pay | Admitting: Family Medicine

## 2019-12-16 ENCOUNTER — Other Ambulatory Visit: Payer: Self-pay

## 2019-12-16 ENCOUNTER — Encounter
Admission: RE | Admit: 2019-12-16 | Discharge: 2019-12-16 | Disposition: A | Discharge status: Home / Self Care | Payer: BC Managed Care – PPO | Source: Ambulatory Visit | Attending: Surgery | Admitting: Surgery

## 2019-12-16 DIAGNOSIS — Z8659 Personal history of other mental and behavioral disorders: Secondary | ICD-10-CM

## 2019-12-16 DIAGNOSIS — Z01812 Encounter for preprocedural laboratory examination: Secondary | ICD-10-CM | POA: Diagnosis not present

## 2019-12-16 DIAGNOSIS — F3341 Major depressive disorder, recurrent, in partial remission: Secondary | ICD-10-CM

## 2019-12-16 DIAGNOSIS — F419 Anxiety disorder, unspecified: Secondary | ICD-10-CM

## 2019-12-16 DIAGNOSIS — E119 Type 2 diabetes mellitus without complications: Secondary | ICD-10-CM

## 2019-12-16 HISTORY — DX: Cerebral infarction, unspecified: I63.9

## 2019-12-16 LAB — CBC WITH DIFFERENTIAL/PLATELET
Abs Immature Granulocytes: 0.03 10*3/uL (ref 0.00–0.07)
Basophils Absolute: 0.1 10*3/uL (ref 0.0–0.1)
Basophils Relative: 1 %
Eosinophils Absolute: 0.3 10*3/uL (ref 0.0–0.5)
Eosinophils Relative: 3 %
HCT: 41.4 % (ref 39.0–52.0)
Hemoglobin: 14.8 g/dL (ref 13.0–17.0)
Immature Granulocytes: 0 %
Lymphocytes Relative: 16 %
Lymphs Abs: 1.3 10*3/uL (ref 0.7–4.0)
MCH: 30.1 pg (ref 26.0–34.0)
MCHC: 35.7 g/dL (ref 30.0–36.0)
MCV: 84.3 fL (ref 80.0–100.0)
Monocytes Absolute: 0.8 10*3/uL (ref 0.1–1.0)
Monocytes Relative: 9 %
Neutro Abs: 5.8 10*3/uL (ref 1.7–7.7)
Neutrophils Relative %: 71 %
Platelets: 203 10*3/uL (ref 150–400)
RBC: 4.91 MIL/uL (ref 4.22–5.81)
RDW: 12.5 % (ref 11.5–15.5)
WBC: 8.2 10*3/uL (ref 4.0–10.5)
nRBC: 0 % (ref 0.0–0.2)

## 2019-12-16 LAB — TYPE AND SCREEN
ABO/RH(D): AB POS
Antibody Screen: NEGATIVE

## 2019-12-16 LAB — URINALYSIS, ROUTINE W REFLEX MICROSCOPIC
Bilirubin Urine: NEGATIVE
Glucose, UA: NEGATIVE mg/dL
Hgb urine dipstick: NEGATIVE
Ketones, ur: NEGATIVE mg/dL
Leukocytes,Ua: NEGATIVE
Nitrite: NEGATIVE
Protein, ur: NEGATIVE mg/dL
Specific Gravity, Urine: 1.006 (ref 1.005–1.030)
pH: 5 (ref 5.0–8.0)

## 2019-12-16 LAB — COMPREHENSIVE METABOLIC PANEL
ALT: 19 U/L (ref 0–44)
AST: 19 U/L (ref 15–41)
Albumin: 4.2 g/dL (ref 3.5–5.0)
Alkaline Phosphatase: 60 U/L (ref 38–126)
Anion gap: 9 (ref 5–15)
BUN: 18 mg/dL (ref 8–23)
CO2: 27 mmol/L (ref 22–32)
Calcium: 9.4 mg/dL (ref 8.9–10.3)
Chloride: 99 mmol/L (ref 98–111)
Creatinine, Ser: 1.13 mg/dL (ref 0.61–1.24)
GFR, Estimated: >60 mL/min (ref >60)
Glucose, Bld: 183 mg/dL — ABNORMAL HIGH (ref 70–99)
Potassium: 3.5 mmol/L (ref 3.5–5.1)
Sodium: 135 mmol/L (ref 135–145)
Total Bilirubin: 1.2 mg/dL (ref 0.3–1.2)
Total Protein: 7.6 g/dL (ref 6.5–8.1)

## 2019-12-16 LAB — SURGICAL PCR SCREEN
MRSA, PCR: NEGATIVE
Staphylococcus aureus: NEGATIVE

## 2019-12-16 NOTE — Patient Instructions (Signed)
Your procedure is scheduled on: Tues 11/2 Report to Day Surgery. To find out your arrival time please call [phone] between 1PM - 3PM on Mon 11/1.  Remember: Instructions that are not followed completely may result in serious medical risk,  up to and including death, or upon the discretion of your surgeon and anesthesiologist your  surgery may need to be rescheduled.     _X__ 1. Do not eat food after midnight the night before your procedure.                 No chewing gum or hard candies. You may drink clear liquids up to 2 hours                 before you are scheduled to arrive for your surgery- DO not drink clear                 liquids within 2 hours of the start of your surgery.                 Clear Liquids include:  water,  G2 or                  Gatorade Zero  Black Coffee or Tea (Do not add                 anything to coffee or tea). __x___2.   Complete the G 2 2 hours before arrival.   __X__2.  On the morning of surgery brush your teeth with toothpaste and water, you                may rinse your mouth with mouthwash if you wish.  Do not swallow any toothpaste of mouthwash.     ___ 3.  No Alcohol for 24 hours before or after surgery.   ___ 4.  Do Not Smoke or use e-cigarettes For 24 Hours Prior to Your Surgery.                 Do not use any chewable tobacco products for at least 6 hours prior to                 Surgery.  ___  5.  Do not use any recreational drugs (marijuana, cocaine, heroin, ecstasy, MDMA or other)                For at least one week prior to your surgery.  Combination of these drugs with anesthesia                May have life threatening results.  ____  6.  Bring all medications with you on the day of surgery if instructed.   __x__  7.  Notify your doctor if there is any change in your medical condition      (cold, fever, infections).     Do not wear jewelry, Do not wear lotions, You may wear deodorant. Do not shave 48  hours prior to surgery. Men may shave face and neck. Do not bring valuables to the hospital.    [facility] is not responsible for any belongings or valuables.  Contacts, dentures or bridgework may not be worn into surgery. Leave your suitcase in the car. After surgery it may be brought to your room. For patients admitted to the hospital, discharge time is determined by your treatment team.   Patients discharged the day of surgery will not be allowed to drive home.  Make arrangements for someone to be with you for the first 24 hours of your Same Day Discharge.    Please read over the following fact sheets that you were given:   Incentive spirometer    _x___ Take these medicines the morning of surgery with A SIP OF WATER:    1. amLODipine (NORVASC) 10 MG tablet  2. buPROPion (WELLBUTRIN) 75 MG tablet  3. esomeprazole (NEXIUM) 20 MG capsule  Night before and morning of surgery  4.gabapentin (NEURONTIN) 300 MG capsule  5.rosuvastatin (CRESTOR) 10 MG tablet  6.sertraline (ZOLOFT) 50 MG tablet  ____ Fleet Enema (as directed)   __x__ Use CHG Soap (or wipes) as directed  ____ Use Benzoyl Peroxide Gel as instructed  ____ Use inhalers on the day of surgery  _x___ Stop metformin 2 days prior to surgery   Last dose 10/30    ____ Take 1/2 of usual insulin dose the night before surgery. No insulin the morning          of surgery.   __x__ Stop aspirin tomorrow  __x__ Stop Anti-inflammatories no ibuprofen, aleve     May take tylenol    __x__ Stop supplements until after surgery.  Omega-3 Fatty Acids (FISH OIL) 1200 MG CAPS  ____ Bring C-Pap to the hospital.    If you have any questions regarding your pre-procedure instructions,  Please call Pre-admit Testing at [place]

## 2019-12-17 ENCOUNTER — Encounter: Payer: Self-pay | Admitting: Surgery

## 2019-12-17 NOTE — Progress Notes (Signed)
[facility] Perioperative Services  Pre-Admission/Anesthesia Testing Clinical Review  Date: 12/17/19  Patient Demographics:  Name: Raymond [name] DOB:   01-Oct-1955 MRN:   [id number]  Planned Surgical Procedure(s):    Case: [id number] Date/Time: 12/23/19 0715   Procedure: TOTAL KNEE ARTHROPLASTY (Right Knee)   Anesthesia type: Choice   Pre-op diagnosis: Primary osteoarthritis of right knee M17.11   Location: [facility] OR ROOM 03 / [facility] FOR ANESTHESIA GROUP   Surgeons: Corky Mull, MD      NOTE: Available PAT nursing documentation and vital signs have been reviewed. Clinical nursing staff has updated patient's PMH/PSHx, current medication list, and drug allergies/intolerances to ensure comprehensive history available to assist in medical decision making as it pertains to the aforementioned surgical procedure and anticipated anesthetic course.   Clinical Discussion:  Raymond [name] is a 64 y.o. male who is submitted for pre-surgical anesthesia review and clearance prior to him undergoing the above procedure. Patient has never been a smoker. Pertinent PMH includes: angina, carotid stenosis, PVD, HTN, HLD, T2DM, CVA, OSA (has PAP therapy device but does not use), asthma, GERD (on daily PPI therapy), OA, depression.  Patient is followed by cardiology [name], MD). He was last seen in the cardiology clinic on 07/17/2019; notes reviewed.  At the time of his clinic visit, patient doing well overall from a cardiovascular perspective.  Patient denies any angina, shortness of breath, PND, orthopnea, peripheral edema, palpitations, vertiginous symptoms, or presyncope/syncope.  Patient underwent myocardial perfusion imaging in 06/2019 that revealed no evidence of stress-induced myocardial ischemia; LVEF 55%.  Last TTE revealed normal LV systolic function; LVEF >27% (see full interpretation of cardiovascular testing below). H Hypertension loosely controlled on current CCB, ARB, and  diuretic therapy.  Decision was made to increase ARB (losartan) dose.  T2DM also loosely controlled on current biguanide (metformin) and sulfonylurea (glipizide); last hemoglobin A1c 9.3% in 04/2019.  Patient is on a statin for his HLD.  Functional capacity per DASI is documented as >4 METS.  Patient scheduled to follow-up with outpatient cardiology in 6 months.  Patient scheduled to undergo elective knee replacement on 12/23/2019 with Dr. Milagros Evener.  Given patient's past medical history, presurgical cardiac clearance was sought by the PAT team.  Per cardiology, Per cardiology, "this patient is optimized for surgery and may proceed with the planned procedural course with a LOW risk stratification". This patient is on daily antiplatelet therapy. He has been instructed on recommendations for holding his daily low-dose ASA for 5 days prior to his procedure. The patient has been instructed that his last dose of his ASA will be on 12/17/2019.  He denies previous perioperative complications with anesthesia. He underwent a general anesthetic course here (ASA III) in 12/2018 with no documented complications.   Vitals with BMI 12/16/2019 04/30/2019 02/27/2019  Height 6\' 1"  6\' 1"  6\' 1"   Weight 254 lbs 255 lbs 257 lbs  BMI 33.52 78.24 23.53  Systolic 614 431 540  Diastolic 79 70 78  Pulse 66 68 66    Providers/Specialists:   NOTE: Primary physician provider listed below. Patient may have been seen by APP or partner within same practice.   PROVIDER ROLE LAST OV  Poggi, Marshall Cork, MD Orthopedics (Surgeon) 11/26/2019  Juline Patch, MD Primary Care Provider 04/30/2019  Serafina Royals, MD Cardiology 07/17/2019   Allergies:  Patient has no known allergies.  Current Home Medications:   No current facility-administered medications for this encounter.   Marland Kitchen amLODipine (NORVASC) 10  MG tablet  . amoxicillin (AMOXIL) 500 MG capsule  . aspirin 81 MG chewable tablet  . buPROPion (WELLBUTRIN) 75 MG tablet  .  cyclobenzaprine (FLEXERIL) 10 MG tablet  . esomeprazole (NEXIUM) 20 MG capsule  . gabapentin (NEURONTIN) 300 MG capsule  . glipiZIDE (GLUCOTROL) 5 MG tablet  . losartan (COZAAR) 100 MG tablet  . Omega-3 Fatty Acids (FISH OIL) 1200 MG CAPS  . rosuvastatin (CRESTOR) 10 MG tablet  . tiZANidine (ZANAFLEX) 2 MG tablet  . triamterene-hydrochlorothiazide (DYAZIDE) 37.5-25 MG capsule  . ACCU-CHEK AVIVA PLUS test strip  . metFORMIN (GLUCOPHAGE) 500 MG tablet  . sertraline (ZOLOFT) 50 MG tablet   History:   Past Medical History:  Diagnosis Date  . Allergy   . Arthritis   . Asthma    mild  . Carotid stenosis   . Carpal tunnel syndrome    bilateral  . Depression   . Diabetes mellitus without complication ([place])    Type II  . GERD (gastroesophageal reflux disease)   . Hyperlipidemia   . Hypertension    neg stress test 10 yrs ago - Dr Humphrey Rolls  . PVD (peripheral vascular disease) ([place])   . Sleep apnea    has BiPAP -doesn't use  . Stroke [facility])     no residual effects  . Varicose vein    treated by Dr. Delana Meyer   Past Surgical History:  Procedure Laterality Date  . CARPAL TUNNEL RELEASE Right 01/02/2019   Procedure: CARPAL TUNNEL RELEASE ENDOSCOPIC;  Surgeon: Corky Mull, MD;  Location: [facility] ORS;  Service: Orthopedics;  Laterality: Right;  . CHOLECYSTECTOMY    . CHONDROPLASTY  10/05/09   knee- shaving patellofemoral joint  . COLONOSCOPY  2009?  . ENDOVENOUS ABLATION SAPHENOUS VEIN W/ LASER    . JOINT REPLACEMENT Left    hip  . KNEE ARTHROSCOPY Left 07/25/2017   Procedure: Arthroscopic partial medial meniscectomy; arthroscopic debridement; and arthroscopic abrasion chondroplasty of medial femoral condyle, lateral tibial plateau, and femoral trochlea; left knee.;  Surgeon: Corky Mull, MD;  Location: [place];  Service: Orthopedics;  Laterality: Left;  . KNEE ARTHROSCOPY WITH MEDIAL MENISECTOMY Right    Partial medial and lateral menisectomy  . SHOULDER ARTHROSCOPY Left  08/10/2014   Procedure: ARTHROSCOPY SHOULDER WITH LIMITED DEBRIDEMENT, REMOVAL OF LOOSE BODY AND RELEASE OF LONG END BICEPS TENDON;  Surgeon: Leanor Kail, MD;  Location: [place];  Service: Orthopedics;  Laterality: Left;  . TOTAL HIP ARTHROPLASTY Left 01/25/2016   Procedure: TOTAL HIP ARTHROPLASTY;  Surgeon: Corky Mull, MD;  Location: [facility] ORS;  Service: Orthopedics;  Laterality: Left;   No family history on file. Social History   Tobacco Use  . Smoking status: Never Smoker  . Smokeless tobacco: Never Used  Vaping Use  . Vaping Use: Never used  Substance Use Topics  . Alcohol use: Yes    Comment: rare  . Drug use: No    Pertinent Clinical Results:  LABS: Labs reviewed: Acceptable for surgery.  Hospital Outpatient Visit on 12/16/2019  Component Date Value Ref Range Status  . WBC 12/16/2019 8.2  4.0 - 10.5 K/uL Final  . RBC 12/16/2019 4.91  4.22 - 5.81 MIL/uL Final  . Hemoglobin 12/16/2019 14.8  13.0 - 17.0 g/dL Final  . HCT 12/16/2019 41.4  39 - 52 % Final  . MCV 12/16/2019 84.3  80.0 - 100.0 fL Final  . MCH 12/16/2019 30.1  26.0 - 34.0 pg Final  . MCHC 12/16/2019 35.7  30.0 -  36.0 g/dL Final  . RDW 12/16/2019 12.5  11.5 - 15.5 % Final  . Platelets 12/16/2019 203  150 - 400 K/uL Final  . nRBC 12/16/2019 0.0  0.0 - 0.2 % Final  . Neutrophils Relative % 12/16/2019 71  % Final  . Neutro Abs 12/16/2019 5.8  1.7 - 7.7 K/uL Final  . Lymphocytes Relative 12/16/2019 16  % Final  . Lymphs Abs 12/16/2019 1.3  0.7 - 4.0 K/uL Final  . Monocytes Relative 12/16/2019 9  % Final  . Monocytes Absolute 12/16/2019 0.8  0.1 - 1.0 K/uL Final  . Eosinophils Relative 12/16/2019 3  % Final  . Eosinophils Absolute 12/16/2019 0.3  0.0 - 0.5 K/uL Final  . Basophils Relative 12/16/2019 1  % Final  . Basophils Absolute 12/16/2019 0.1  0.0 - 0.1 K/uL Final  . Immature Granulocytes 12/16/2019 0  % Final  . Abs Immature Granulocytes 12/16/2019 0.03  0.00 - 0.07 K/uL Final   Performed at  [facility], [address] [place], [place] [address]  . Sodium 12/16/2019 135  135 - 145 mmol/L Final  . Potassium 12/16/2019 3.5  3.5 - 5.1 mmol/L Final  . Chloride 12/16/2019 99  98 - 111 mmol/L Final  . CO2 12/16/2019 27  22 - 32 mmol/L Final  . Glucose, Bld 12/16/2019 183* 70 - 99 mg/dL Final   Glucose reference range applies only to samples taken after fasting for at least 8 hours.  . BUN 12/16/2019 18  8 - 23 mg/dL Final  . Creatinine, Ser 12/16/2019 1.13  0.61 - 1.24 mg/dL Final  . Calcium 12/16/2019 9.4  8.9 - 10.3 mg/dL Final  . Total Protein 12/16/2019 7.6  6.5 - 8.1 g/dL Final  . Albumin 12/16/2019 4.2  3.5 - 5.0 g/dL Final  . AST 12/16/2019 19  15 - 41 U/L Final  . ALT 12/16/2019 19  0 - 44 U/L Final  . Alkaline Phosphatase 12/16/2019 60  38 - 126 U/L Final  . Total Bilirubin 12/16/2019 1.2  0.3 - 1.2 mg/dL Final  . GFR, Estimated 12/16/2019 >60  >60 mL/min Final   Comment: (NOTE) Calculated using the CKD-EPI Creatinine Equation (2021)   . Anion gap 12/16/2019 9  5 - 15 Final   Performed at [facility], [address] [place], [place] [address]  . ABO/RH(D) 12/16/2019 AB POS   Final  . Antibody Screen 12/16/2019 NEG   Final  . Sample Expiration 12/16/2019 12/30/2019,2359   Final  . Extend sample reason 12/16/2019    Final                   Value:NO TRANSFUSIONS OR PREGNANCY IN THE PAST 3 MONTHS Performed at [facility], [address] [place], [place] [address]   . Color, Urine 12/16/2019 STRAW* YELLOW Final  . APPearance 12/16/2019 CLEAR* CLEAR Final  . Specific Gravity, Urine 12/16/2019 1.006  1.005 - 1.030 Final  . pH 12/16/2019 5.0  5.0 - 8.0 Final  . Glucose, UA 12/16/2019 NEGATIVE  NEGATIVE mg/dL Final  . Hgb urine dipstick 12/16/2019 NEGATIVE  NEGATIVE Final  . Bilirubin Urine 12/16/2019 NEGATIVE  NEGATIVE Final  . Ketones, ur 12/16/2019 NEGATIVE  NEGATIVE mg/dL Final  . Protein, ur 12/16/2019 NEGATIVE  NEGATIVE mg/dL Final   . Nitrite 12/16/2019 NEGATIVE  NEGATIVE Final  . Chalmers Guest 12/16/2019 NEGATIVE  NEGATIVE Final   Performed at [facility], [address] [place],  [address]  . MRSA, PCR 12/16/2019  NEGATIVE  NEGATIVE Final  . Staphylococcus aureus 12/16/2019 NEGATIVE  NEGATIVE Final   Comment: (NOTE) The Xpert SA Assay (FDA approved for NASAL specimens in patients 68 years of age and older), is one component of a comprehensive surveillance program. It is not intended to diagnose infection nor to guide or monitor treatment. Performed at [facility], [address] [place], [place] [address]      ECG: Date: 07/17/2019 Rhythm: normal sinus ST segment and T wave changes: No evidence of acute ST segment elevation or depression Comparison: Similar to previous tracing obtained on 05/14/2019 NOTE: Tracing obtained at [facility]; unable for review. Above based on cardiologist's interpretation.    IMAGING / PROCEDURES: LEXISCAN done on 07/17/2019 1. LVEF 55% 2. Regional wall motion reveals normal myocardial thickening and wall motion 3. Overall quality of the study is good 4. No artifacts noted 5. Normal left ventricular cavity 6. No evidence of stress-induced myocardial ischemia  ECHOCARDIOGRAM WITH BUBBLE STUDY done on 07/17/2019 1. LVEF >55% 2. Normal LV systolic function 3. Normal RV systolic function 4. Mild tricuspid, mitral, and aortic valve insufficiency 5. No valvular stenosis 6. Sclerotic aortic valve 7. Mild RV enlargement 8. Mild biatrial enlargement 9. Negative bubble study  BILATERAL CAROTID DUPLEX done on 03/26/2019 1. Right Carotid: Velocities in the right ICA are consistent with a 40-59% stenosis. 2. Left Carotid: Velocities in the left ICA are consistent with a 1-39% stenosis.  3. Vertebrals:  Bilateral vertebral arteries demonstrate antegrade flow.  4. Subclavians: Normal flow hemodynamics were seen in bilateral subclavian arteries.      Impression and Plan:  NAIM MURTHA has been referred for pre-anesthesia review and clearance prior to him undergoing the planned anesthetic and procedural courses. Available labs, pertinent testing, and imaging results were personally reviewed by me. This patient has been appropriately cleared by cardiology with a LOW risk stratification.   Labs performed during patient's PAT appointment revealed an elevated glucose level of 183 mg/dL. Review of past history reveals patient has a T2DM diagnosis; last A1c 9.3% in 04/2019; goal </= 7.0%.  Will recheck hemoglobin A1c level today. If significantly elevated above goal of 7.0%, will communicate results to attending surgeon to determine if glycemic control is adequate enough to proceed with elective intervention at this time.  Otherwise, based on clinical review performed today (12/17/19), barring any significant acute changes in the patient's overall condition, it is anticipated that he will be able to proceed with the planned surgical intervention. Any acute changes in clinical condition may necessitate his procedure being postponed and/or cancelled. Pre-surgical instructions were reviewed with the patient during his PAT appointment and questions were fielded by PAT clinical staff.  ADDENDUM: 3419 on 12/18/2019 - Repeat Hgb A1c resulted at 6.9% (goal </= 7.0%). Patient's glycemic control has improved overall when compared to A1c obtained in March of this year. Risk of SSI/PJI reduced with adequate glycemic control. Patient to proceed to surgery as planned.   Honor Loh, MSN, APRN, FNP-C, CEN [facility]  Peri-operative Services Nurse Practitioner Phone: [phone] 12/17/19 9:02 AM  NOTE: This note has been prepared using Dragon dictation software. Despite my best ability to proofread, there is always the potential that unintentional transcriptional errors may still occur from this process.

## 2019-12-18 LAB — HEMOGLOBIN A1C
Hgb A1c MFr Bld: 6.9 % — ABNORMAL HIGH (ref 4.8–5.6)
Mean Plasma Glucose: 151 mg/dL

## 2019-12-19 ENCOUNTER — Other Ambulatory Visit: Payer: Self-pay

## 2019-12-19 ENCOUNTER — Other Ambulatory Visit
Admission: RE | Admit: 2019-12-19 | Discharge: 2019-12-19 | Disposition: A | Discharge status: Home / Self Care | Payer: BC Managed Care – PPO | Source: Ambulatory Visit | Attending: Surgery | Admitting: Surgery

## 2019-12-19 DIAGNOSIS — Z20822 Contact with and (suspected) exposure to covid-19: Secondary | ICD-10-CM | POA: Insufficient documentation

## 2019-12-19 DIAGNOSIS — Z01812 Encounter for preprocedural laboratory examination: Secondary | ICD-10-CM | POA: Insufficient documentation

## 2019-12-20 LAB — SARS CORONAVIRUS 2 (TAT 6-24 HRS): SARS Coronavirus 2: NEGATIVE

## 2019-12-22 ENCOUNTER — Ambulatory Visit (INDEPENDENT_AMBULATORY_CARE_PROVIDER_SITE_OTHER): Payer: BC Managed Care – PPO | Admitting: Family Medicine

## 2019-12-22 ENCOUNTER — Other Ambulatory Visit: Payer: Self-pay

## 2019-12-22 ENCOUNTER — Encounter: Payer: Self-pay | Admitting: Family Medicine

## 2019-12-22 VITALS — BP 110/70 | HR 80 | Ht 73.0 in | Wt 254.0 lb

## 2019-12-22 DIAGNOSIS — F3341 Major depressive disorder, recurrent, in partial remission: Secondary | ICD-10-CM

## 2019-12-22 DIAGNOSIS — E119 Type 2 diabetes mellitus without complications: Secondary | ICD-10-CM | POA: Diagnosis not present

## 2019-12-22 DIAGNOSIS — G6189 Other inflammatory polyneuropathies: Secondary | ICD-10-CM

## 2019-12-22 DIAGNOSIS — Z8659 Personal history of other mental and behavioral disorders: Secondary | ICD-10-CM

## 2019-12-22 DIAGNOSIS — Z23 Encounter for immunization: Secondary | ICD-10-CM | POA: Diagnosis not present

## 2019-12-22 DIAGNOSIS — E7801 Familial hypercholesterolemia: Secondary | ICD-10-CM | POA: Diagnosis not present

## 2019-12-22 DIAGNOSIS — F419 Anxiety disorder, unspecified: Secondary | ICD-10-CM

## 2019-12-22 DIAGNOSIS — I10 Essential (primary) hypertension: Secondary | ICD-10-CM | POA: Diagnosis not present

## 2019-12-22 DIAGNOSIS — E78019 Familial hypercholesterolemia, unspecified: Secondary | ICD-10-CM

## 2019-12-22 MED ORDER — LOSARTAN POTASSIUM 100 MG PO TABS: 50.0000 mg | ORAL_TABLET | Freq: Every day | ORAL | 1 refills | Status: DC

## 2019-12-22 MED ORDER — SODIUM CHLORIDE 0.9 % IV SOLN: INTRAVENOUS | Status: DC

## 2019-12-22 MED ORDER — TRIAMTERENE-HCTZ 37.5-25 MG PO CAPS: 1.0000 | ORAL_CAPSULE | Freq: Every day | ORAL | 1 refills | Status: DC

## 2019-12-22 MED ORDER — SERTRALINE HCL 50 MG PO TABS: 50.0000 mg | ORAL_TABLET | Freq: Every day | ORAL | 1 refills | Status: DC

## 2019-12-22 MED ORDER — CEFAZOLIN SODIUM-DEXTROSE 2-4 GM/100ML-% IV SOLN
2.0000 g | INTRAVENOUS | Status: AC
  Administered 2019-12-23: 2 g via INTRAVENOUS

## 2019-12-22 MED ORDER — ROSUVASTATIN CALCIUM 10 MG PO TABS
10.0000 mg | ORAL_TABLET | Freq: Every day | ORAL | 1 refills | Status: DC
Start: 2019-12-22 — End: 2020-07-12

## 2019-12-22 MED ORDER — CHLORHEXIDINE GLUCONATE 0.12 % MT SOLN: 15.0000 mL | Freq: Once | OROMUCOSAL | Status: AC

## 2019-12-22 MED ORDER — BUPROPION HCL 75 MG PO TABS: 75.0000 mg | ORAL_TABLET | Freq: Two times a day (BID) | ORAL | 1 refills | Status: DC

## 2019-12-22 MED ORDER — ORAL CARE MOUTH RINSE: 15.0000 mL | Freq: Once | OROMUCOSAL | Status: AC

## 2019-12-22 MED ORDER — PANTOPRAZOLE SODIUM 40 MG PO TBEC
40.0000 mg | DELAYED_RELEASE_TABLET | Freq: Every day | ORAL | 1 refills | Status: DC
Start: 2019-12-22 — End: 2020-07-06

## 2019-12-22 MED ORDER — AMLODIPINE BESYLATE 10 MG PO TABS: 10.0000 mg | ORAL_TABLET | Freq: Every day | ORAL | 1 refills | Status: DC

## 2019-12-22 MED ORDER — GLIPIZIDE 5 MG PO TABS: ORAL_TABLET | ORAL | 1 refills | Status: DC

## 2019-12-22 MED ORDER — METFORMIN HCL 500 MG PO TABS: 500.0000 mg | ORAL_TABLET | Freq: Two times a day (BID) | ORAL | 1 refills | Status: DC

## 2019-12-22 NOTE — Patient Instructions (Signed)
Carbohydrate Counting for Diabetes Mellitus, Adult  Carbohydrate counting is a method of keeping track of how many carbohydrates you eat. Eating carbohydrates naturally increases the amount of sugar (glucose) in the blood. Counting how many carbohydrates you eat helps keep your blood glucose within normal limits, which helps you manage your diabetes (diabetes mellitus). It is important to know how many carbohydrates you can safely have in each meal. This is different for every person. A diet and nutrition specialist (registered dietitian) can help you make a meal plan and calculate how many carbohydrates you should have at each meal and snack. Carbohydrates are found in the following foods:  Grains, such as breads and cereals.  Dried beans and soy products.  Starchy vegetables, such as potatoes, peas, and corn.  Fruit and fruit juices.  Milk and yogurt.  Sweets and snack foods, such as cake, cookies, candy, chips, and soft drinks. How do I count carbohydrates? There are two ways to count carbohydrates in food. You can use either of the methods or a combination of both. Reading "Nutrition Facts" on packaged food The "Nutrition Facts" list is included on the labels of almost all packaged foods and beverages in the [place] It includes:  The serving size.  Information about nutrients in each serving, including the grams (g) of carbohydrate per serving. To use the "Nutrition Facts":  Decide how many servings you will have.  Multiply the number of servings by the number of carbohydrates per serving.  The resulting number is the total amount of carbohydrates that you will be having. Learning standard serving sizes of other foods When you eat carbohydrate foods that are not packaged or do not include "Nutrition Facts" on the label, you need to measure the servings in order to count the amount of carbohydrates:  Measure the foods that you will eat with a food scale or measuring cup, if  needed.  Decide how many standard-size servings you will eat.  Multiply the number of servings by 15. Most carbohydrate-rich foods have about 15 g of carbohydrates per serving. ? For example, if you eat 8 oz (170 g) of strawberries, you will have eaten 2 servings and 30 g of carbohydrates (2 servings x 15 g = 30 g).  For foods that have more than one food mixed, such as soups and casseroles, you must count the carbohydrates in each food that is included. The following list contains standard serving sizes of common carbohydrate-rich foods. Each of these servings has about 15 g of carbohydrates:   hamburger bun or  English muffin.   oz (15 mL) syrup.   oz (14 g) jelly.  1 slice of bread.  1 six-inch tortilla.  3 oz (85 g) cooked rice or pasta.  4 oz (113 g) cooked dried beans.  4 oz (113 g) starchy vegetable, such as peas, corn, or potatoes.  4 oz (113 g) hot cereal.  4 oz (113 g) mashed potatoes or  of a large baked potato.  4 oz (113 g) canned or frozen fruit.  4 oz (120 mL) fruit juice.  4-6 crackers.  6 chicken nuggets.  6 oz (170 g) unsweetened dry cereal.  6 oz (170 g) plain fat-free yogurt or yogurt sweetened with artificial sweeteners.  8 oz (240 mL) milk.  8 oz (170 g) fresh fruit or one small piece of fruit.  24 oz (680 g) popped popcorn. Example of carbohydrate counting Sample meal  3 oz (85 g) chicken breast.  6 oz (170 g)   brown rice.  4 oz (113 g) corn.  8 oz (240 mL) milk.  8 oz (170 g) strawberries with sugar-free whipped topping. Carbohydrate calculation 1. Identify the foods that contain carbohydrates: ? Rice. ? Corn. ? Milk. ? Strawberries. 2. Calculate how many servings you have of each food: ? 2 servings rice. ? 1 serving corn. ? 1 serving milk. ? 1 serving strawberries. 3. Multiply each number of servings by 15 g: ? 2 servings rice x 15 g = 30 g. ? 1 serving corn x 15 g = 15 g. ? 1 serving milk x 15 g = 15 g. ? 1  serving strawberries x 15 g = 15 g. 4. Add together all of the amounts to find the total grams of carbohydrates eaten: ? 30 g + 15 g + 15 g + 15 g = 75 g of carbohydrates total. Summary  Carbohydrate counting is a method of keeping track of how many carbohydrates you eat.  Eating carbohydrates naturally increases the amount of sugar (glucose) in the blood.  Counting how many carbohydrates you eat helps keep your blood glucose within normal limits, which helps you manage your diabetes.  A diet and nutrition specialist (registered dietitian) can help you make a meal plan and calculate how many carbohydrates you should have at each meal and snack. This information is not intended to replace advice given to you by your health care provider. Make sure you discuss any questions you have with your health care provider. Document Revised: 08/31/2016 Document Reviewed: 07/21/2015 Elsevier Patient Education  2020 [employer]  

## 2019-12-22 NOTE — Progress Notes (Signed)
Date:  12/22/2019   Name:  Raymond [name]   DOB:  10-01-1955   MRN:  [id number]   Chief Complaint: Hypertension, Diabetes (foot exam), Depression, Hyperlipidemia, Flu Vaccine, and Gastroesophageal Reflux  Hypertension This is a chronic problem. The current episode started more than 1 year ago. The problem has been gradually improving since onset. The problem is controlled. Pertinent negatives include no anxiety, blurred vision, chest pain, headaches, malaise/fatigue, neck pain, orthopnea, palpitations, peripheral edema, PND, shortness of breath or sweats. There are no associated agents to hypertension. There are no known risk factors for coronary artery disease. Past treatments include angiotensin blockers, calcium channel blockers and diuretics. The current treatment provides moderate improvement. There are no compliance problems.  There is no history of angina, kidney disease, CAD/MI, CVA, heart failure, left ventricular hypertrophy, PVD or retinopathy. There is no history of chronic renal disease, a hypertension causing med or renovascular disease.  Diabetes He presents for his follow-up diabetic visit. He has type 2 diabetes mellitus. His disease course has been stable. There are no hypoglycemic associated symptoms. Pertinent negatives for hypoglycemia include no dizziness, headaches, nervousness/anxiousness or sweats. Pertinent negatives for diabetes include no blurred vision, no chest pain, no fatigue, no polydipsia, no polyphagia, no polyuria and no weight loss. There are no hypoglycemic complications. Symptoms are stable. Pertinent negatives for diabetic complications include no CVA, PVD or retinopathy. His weight is fluctuating minimally. Meal planning includes avoidance of concentrated sweets and carbohydrate counting. His breakfast blood glucose is taken between 8-9 am. His breakfast blood glucose range is generally 140-180 mg/dl. An ACE inhibitor/angiotensin II receptor blocker is being  taken.  Depression        This is a chronic problem.  The current episode started more than 1 year ago.   The problem has been gradually improving since onset.  Associated symptoms include no decreased concentration, no fatigue, no helplessness, no hopelessness, does not have insomnia, not irritable, no restlessness, no decreased interest, no appetite change, no body aches, no myalgias, no headaches, no indigestion, not sad and no suicidal ideas.  Past treatments include SSRIs - Selective serotonin reuptake inhibitors.  Previous treatment provided mild relief.   Pertinent negatives include no hypothyroidism and no anxiety. Hyperlipidemia This is a chronic problem. The current episode started more than 1 year ago. The problem is controlled. Recent lipid tests were reviewed and are normal. He has no history of chronic renal disease, diabetes, hypothyroidism, liver disease or obesity. Pertinent negatives include no chest pain, myalgias or shortness of breath. Current antihyperlipidemic treatment includes statins. The current treatment provides moderate improvement of lipids. There are no compliance problems.   Gastroesophageal Reflux He reports no abdominal pain, no chest pain, no coughing, no dysphagia, no heartburn, no nausea, no sore throat or no wheezing. This is a chronic problem. Pertinent negatives include no fatigue or weight loss.    Lab Results  Component Value Date   CREATININE 1.13 12/16/2019   BUN 18 12/16/2019   NA 135 12/16/2019   K 3.5 12/16/2019   CL 99 12/16/2019   CO2 27 12/16/2019   Lab Results  Component Value Date   CHOL 200 (H) 11/07/2018   HDL 51 11/07/2018   LDLCALC 127 (H) 11/07/2018   TRIG 126 11/07/2018   CHOLHDL 3.3 07/24/2016   Lab Results  Component Value Date   TSH 2.620 11/07/2018   Lab Results  Component Value Date   HGBA1C 6.9 (H) 12/16/2019   Lab Results  Component Value Date   WBC 8.2 12/16/2019   HGB 14.8 12/16/2019   HCT 41.4 12/16/2019   MCV  84.3 12/16/2019   PLT 203 12/16/2019   Lab Results  Component Value Date   ALT 19 12/16/2019   AST 19 12/16/2019   ALKPHOS 60 12/16/2019   BILITOT 1.2 12/16/2019     Review of Systems  Constitutional: Negative for appetite change, chills, fatigue, fever, malaise/fatigue and weight loss.  HENT: Negative for drooling, ear discharge, ear pain and sore throat.   Eyes: Negative for blurred vision.  Respiratory: Negative for cough, shortness of breath and wheezing.   Cardiovascular: Negative for chest pain, palpitations, orthopnea, leg swelling and PND.  Gastrointestinal: Negative for abdominal pain, blood in stool, constipation, diarrhea, dysphagia, heartburn and nausea.  Endocrine: Negative for polydipsia, polyphagia and polyuria.  Genitourinary: Negative for dysuria, frequency, hematuria and urgency.  Musculoskeletal: Negative for back pain, myalgias and neck pain.  Skin: Negative for rash.  Allergic/Immunologic: Negative for environmental allergies.  Neurological: Negative for dizziness and headaches.  Hematological: Does not bruise/bleed easily.  Psychiatric/Behavioral: Positive for depression. Negative for decreased concentration and suicidal ideas. The patient is not nervous/anxious and does not have insomnia.     Patient Active Problem List   Diagnosis Date Noted  . Varicose veins of leg with pain, left 12/30/2018  . Carpal tunnel syndrome, right 08/09/2018  . Primary osteoarthritis of left shoulder 08/09/2018  . Rotator cuff tendinitis, left 08/09/2018  . Strain of left knee 08/06/2017  . Obesity (BMI 35.0-39.9 without comorbidity) 08/03/2017  . Status post arthroscopy of left knee 08/03/2017  . Complex tear of medial meniscus of left knee as current injury 07/26/2017  . Chest pain 07/24/2016  . Uncontrolled type 2 diabetes mellitus without complication, without long-term current use of insulin 07/24/2016  . Status post total hip replacement, left 01/25/2016  . Benign  essential HTN 07/21/2015  . Depression 07/21/2015  . Hyperlipidemia, mixed 07/21/2015  . GERD (gastroesophageal reflux disease) 12/04/2014  . Biceps tendinitis 07/21/2014  . Impingement syndrome of shoulder 07/21/2014  . Primary osteoarthritis of right knee 08/14/2013  . Neuritis or radiculitis due to rupture of lumbar intervertebral disc 08/14/2013    No Known Allergies  Past Surgical History:  Procedure Laterality Date  . CARPAL TUNNEL RELEASE Right 01/02/2019   Procedure: CARPAL TUNNEL RELEASE ENDOSCOPIC;  Surgeon: Corky Mull, MD;  Location: [facility];  Service: Orthopedics;  Laterality: Right;  . CHOLECYSTECTOMY    . CHONDROPLASTY  10/05/09   knee- shaving patellofemoral joint  . COLONOSCOPY  2009?  . ENDOVENOUS ABLATION SAPHENOUS VEIN W/ LASER    . JOINT REPLACEMENT Left    hip  . KNEE ARTHROSCOPY Left 07/25/2017   Procedure: Arthroscopic partial medial meniscectomy; arthroscopic debridement; and arthroscopic abrasion chondroplasty of medial femoral condyle, lateral tibial plateau, and femoral trochlea; left knee.;  Surgeon: Corky Mull, MD;  Location: [place];  Service: Orthopedics;  Laterality: Left;  . KNEE ARTHROSCOPY WITH MEDIAL MENISECTOMY Right    Partial medial and lateral menisectomy  . SHOULDER ARTHROSCOPY Left 08/10/2014   Procedure: ARTHROSCOPY SHOULDER WITH LIMITED DEBRIDEMENT, REMOVAL OF LOOSE BODY AND RELEASE OF LONG END BICEPS TENDON;  Surgeon: Leanor Kail, MD;  Location: [place];  Service: Orthopedics;  Laterality: Left;  . TOTAL HIP ARTHROPLASTY Left 01/25/2016   Procedure: TOTAL HIP ARTHROPLASTY;  Surgeon: Corky Mull, MD;  Location: [facility];  Service: Orthopedics;  Laterality: Left;    Social History  Tobacco Use  . Smoking status: Never Smoker  . Smokeless tobacco: Never Used  Vaping Use  . Vaping Use: Never used  Substance Use Topics  . Alcohol use: Yes    Comment: rare  . Drug use: No     Medication list has been  reviewed and updated.  Current Meds  Medication Sig  . ACCU-CHEK AVIVA PLUS test strip TEST AS DIRECTED  . amLODipine (NORVASC) 10 MG tablet TAKE 1 TABLET BY MOUTH EVERY DAY (Patient taking differently: Take 10 mg by mouth daily. )  . aspirin 81 MG chewable tablet Chew 81 mg by mouth daily.  Marland Kitchen buPROPion (WELLBUTRIN) 75 MG tablet TAKE 1 TABLET BY MOUTH TWICE A DAY (Patient taking differently: Take 75 mg by mouth 2 (two) times daily. )  . cyclobenzaprine (FLEXERIL) 10 MG tablet Take 10 mg by mouth 2 (two) times daily as needed for muscle spasms.  Marland Kitchen esomeprazole (NEXIUM) 20 MG capsule Take 20 mg by mouth daily at 12 noon.  . gabapentin (NEURONTIN) 300 MG capsule 3 capsules tid (Patient taking differently: Take 900 mg by mouth 3 (three) times daily. )  . glipiZIDE (GLUCOTROL) 5 MG tablet TAKE 1 TABLET BY MOUTH 2 TIMES DAILY BEFORE A MEAL (Patient taking differently: Take 5 mg by mouth 2 (two) times daily before a meal. )  . losartan (COZAAR) 100 MG tablet TAKE 1/2 TABLET BY MOUTH DAILY (Patient taking differently: Take 100 mg by mouth daily. )  . metFORMIN (GLUCOPHAGE) 500 MG tablet TAKE 1 TABLET (500 MG TOTAL) BY MOUTH 2 (TWO) TIMES DAILY WITH A MEAL.  Marland Kitchen Omega-3 Fatty Acids (FISH OIL) 1200 MG CAPS Take 1,200 mg by mouth daily.  . rosuvastatin (CRESTOR) 10 MG tablet Take 10 mg by mouth daily.  . sertraline (ZOLOFT) 50 MG tablet Take 1 tablet (50 mg total) by mouth daily.  Marland Kitchen tiZANidine (ZANAFLEX) 2 MG tablet Take 2 mg by mouth at bedtime as needed for muscle spasms.   Marland Kitchen triamterene-hydrochlorothiazide (DYAZIDE) 37.5-25 MG capsule Take 1 capsule by mouth daily.    PHQ 2/9 Scores 12/22/2019 04/30/2019 11/07/2018 04/09/2018  PHQ - 2 Score 0 0 0 0  PHQ- 9 Score 0 0 1 0    GAD 7 : Generalized Anxiety Score 12/22/2019 04/30/2019 11/07/2018  Nervous, Anxious, on Edge 0 0 0  Control/stop worrying 0 0 0  Worry too much - different things 0 0 0  Trouble relaxing 0 0 0  Restless 0 0 0  Easily annoyed or  irritable 0 0 0  Afraid - awful might happen 0 0 0  Total GAD 7 Score 0 0 0    BP Readings from Last 3 Encounters:  12/22/19 110/70  12/16/19 (!) 142/79  04/30/19 120/70    Physical Exam Vitals and nursing note reviewed.  Constitutional:      General: He is not irritable. HENT:     Head: Normocephalic.     Right Ear: Tympanic membrane, ear canal and external ear normal.     Left Ear: Tympanic membrane, ear canal and external ear normal.     Nose: Nose normal. No congestion or rhinorrhea.  Eyes:     General: No scleral icterus.       Right eye: No discharge.        Left eye: No discharge.     Conjunctiva/sclera: Conjunctivae normal.     Pupils: Pupils are equal, round, and reactive to light.  Neck:     Thyroid: No thyromegaly.  Vascular: No carotid bruit or JVD.     Trachea: No tracheal deviation.  Cardiovascular:     Rate and Rhythm: Normal rate and regular rhythm.     Pulses:          Dorsalis pedis pulses are 2+ on the right side and 2+ on the left side.       Posterior tibial pulses are 2+ on the right side and 2+ on the left side.     Heart sounds: Normal heart sounds. No murmur heard.  No friction rub. No gallop.   Pulmonary:     Effort: No respiratory distress.     Breath sounds: Normal breath sounds. No wheezing, rhonchi or rales.  Abdominal:     General: Abdomen is flat. Bowel sounds are normal.     Palpations: Abdomen is soft. There is no mass.     Tenderness: There is no abdominal tenderness. There is no guarding or rebound.  Musculoskeletal:        General: No tenderness. Normal range of motion.     Cervical back: Normal range of motion and neck supple.  Feet:     Right foot:     Protective Sensation: 10 sites tested. 10 sites sensed.     Skin integrity: Dry skin present. No ulcer, blister, skin breakdown, erythema, warmth, callus or fissure.     Left foot:     Protective Sensation: 10 sites tested. 10 sites sensed.     Skin integrity: Dry skin  present. No ulcer, blister, skin breakdown, erythema, warmth, callus or fissure.  Lymphadenopathy:     Cervical: No cervical adenopathy.  Skin:    General: Skin is warm.     Findings: No bruising, erythema or rash.  Neurological:     Mental Status: He is alert and oriented to person, place, and time.     Cranial Nerves: No cranial nerve deficit.     Deep Tendon Reflexes: Reflexes are normal and symmetric.     Wt Readings from Last 3 Encounters:  12/22/19 254 lb (115.2 kg)  12/16/19 254 lb (115.2 kg)  04/30/19 255 lb (115.7 kg)    BP 110/70   Pulse 80   Ht 6\' 1"  (1.854 m)   Wt 254 lb (115.2 kg)   BMI 33.51 kg/m   Assessment and Plan: 1. Type 2 diabetes mellitus without complication, without long-term current use of insulin ([place]) .  Controlled.  Stable.  Continue glipizide 5 mg twice a day and Metformin 500 mg twice a day will check micro albuminuria. - glipiZIDE (GLUCOTROL) 5 MG tablet; TAKE 1 TABLET BY MOUTH 2 TIMES DAILY BEFORE A MEAL  Dispense: 180 tablet; Refill: 1 - metFORMIN (GLUCOPHAGE) 500 MG tablet; Take 1 tablet (500 mg total) by mouth 2 (two) times daily with a meal.  Dispense: 90 tablet; Refill: 1 - Microalbumin, urine  2. Essential hypertension blood pressure 110/70. Chronic.  Controlled.  Stable.  Blood pressure 110/20.  Continue amlodipine 10 mg, losartan 100 mg 1/2 tablet daily.  And Dyazide 1 daily. - amLODipine (NORVASC) 10 MG tablet; Take 1 tablet (10 mg total) by mouth daily.  Dispense: 90 tablet; Refill: 1 - losartan (COZAAR) 100 MG tablet; Take 0.5 tablets (50 mg total) by mouth daily.  Dispense: 45 tablet; Refill: 1 - triamterene-hydrochlorothiazide (DYAZIDE) 37.5-25 MG capsule; Take 1 each (1 capsule total) by mouth daily.  Dispense: 90 capsule; Refill: 1  3. Recurrent major depressive disorder, in partial remission (HCC) Chronic.  Controlled.  Stable.  PHQ 0 gad score 0.  We will continue bupropion 75 mg twice a day and sertraline 50 mg once a day. -  buPROPion (WELLBUTRIN) 75 MG tablet; Take 1 tablet (75 mg total) by mouth 2 (two) times daily.  Dispense: 180 tablet; Refill: 1 - sertraline (ZOLOFT) 50 MG tablet; Take 1 tablet (50 mg total) by mouth daily.  Dispense: 90 tablet; Refill: 1  4. Other inflammatory polyneuropathies (HCC) Continue gabapentin 300 mg 3 times a day.  5. History of posttraumatic stress disorder (PTSD) Chronic.  Controlled.  Stable.  PHQ score 0 continue sertraline 50 mg 1 a day. - sertraline (ZOLOFT) 50 MG tablet; Take 1 tablet (50 mg total) by mouth daily.  Dispense: 90 tablet; Refill: 1  6. Anxiety As noted above continue sertraline 50 mg 1 a day. - sertraline (ZOLOFT) 50 MG tablet; Take 1 tablet (50 mg total) by mouth daily.  Dispense: 90 tablet; Refill: 1  7. Familial hypercholesterolemia Review of labs notes system occasional mild elevation of LDL we will continue with dietary control. - Lipid Panel With LDL/HDL Ratio  8. Need for immunization against influenza Discussed and administered. - Flu Vaccine QUAD High Dose(Fluad)

## 2019-12-23 ENCOUNTER — Ambulatory Visit
Admission: RE | Admit: 2019-12-23 | Discharge: 2019-12-23 | Disposition: A | Discharge status: Home / Self Care | Payer: BC Managed Care – PPO | Attending: Surgery | Admitting: Surgery

## 2019-12-23 ENCOUNTER — Encounter: Payer: Self-pay | Admitting: Surgery

## 2019-12-23 ENCOUNTER — Encounter
Admission: RE | Disposition: A | Discharge status: Home / Self Care | Payer: Self-pay | Source: Home / Self Care | Attending: Surgery

## 2019-12-23 ENCOUNTER — Ambulatory Visit: Payer: BC Managed Care – PPO | Admitting: Urgent Care

## 2019-12-23 ENCOUNTER — Ambulatory Visit: Payer: BC Managed Care – PPO

## 2019-12-23 DIAGNOSIS — I10 Essential (primary) hypertension: Secondary | ICD-10-CM | POA: Diagnosis not present

## 2019-12-23 DIAGNOSIS — M170 Bilateral primary osteoarthritis of knee: Secondary | ICD-10-CM | POA: Insufficient documentation

## 2019-12-23 DIAGNOSIS — Z471 Aftercare following joint replacement surgery: Secondary | ICD-10-CM | POA: Diagnosis not present

## 2019-12-23 DIAGNOSIS — Z96651 Presence of right artificial knee joint: Secondary | ICD-10-CM | POA: Diagnosis not present

## 2019-12-23 DIAGNOSIS — M1711 Unilateral primary osteoarthritis, right knee: Secondary | ICD-10-CM | POA: Diagnosis not present

## 2019-12-23 DIAGNOSIS — G4730 Sleep apnea, unspecified: Secondary | ICD-10-CM | POA: Diagnosis not present

## 2019-12-23 DIAGNOSIS — E119 Type 2 diabetes mellitus without complications: Secondary | ICD-10-CM | POA: Diagnosis not present

## 2019-12-23 HISTORY — PX: INJECTION KNEE: SHX2446

## 2019-12-23 HISTORY — PX: TOTAL KNEE ARTHROPLASTY: SHX125

## 2019-12-23 HISTORY — DX: Occlusion and stenosis of unspecified carotid artery: I65.29

## 2019-12-23 HISTORY — DX: Peripheral vascular disease, unspecified: I73.9

## 2019-12-23 LAB — LIPID PANEL WITH LDL/HDL RATIO
Cholesterol, Total: 175 mg/dL (ref 100–199)
HDL: 42 mg/dL (ref >39)
LDL Chol Calc (NIH): 115 mg/dL — ABNORMAL HIGH (ref 0–99)
LDL/HDL Ratio: 2.7 ratio (ref 0.0–3.6)
Triglycerides: 96 mg/dL (ref 0–149)
VLDL Cholesterol Cal: 18 mg/dL (ref 5–40)

## 2019-12-23 LAB — GLUCOSE, CAPILLARY
Glucose-Capillary: 193 mg/dL — ABNORMAL HIGH (ref 70–99)
Glucose-Capillary: 212 mg/dL — ABNORMAL HIGH (ref 70–99)

## 2019-12-23 LAB — MICROALBUMIN, URINE: Microalbumin, Urine: 19.4 ug/mL

## 2019-12-23 SURGERY — ARTHROPLASTY, KNEE, TOTAL
Anesthesia: Spinal | Site: Knee | Laterality: Right

## 2019-12-23 MED ORDER — KETOROLAC TROMETHAMINE 15 MG/ML IJ SOLN
INTRAMUSCULAR | Status: AC
  Administered 2019-12-23: 15 mg via INTRAVENOUS
  Filled 2019-12-23: qty 1

## 2019-12-23 MED ORDER — SODIUM CHLORIDE (PF) 0.9 % IJ SOLN
INTRAMUSCULAR | Status: DC | PRN
  Administered 2019-12-23: 40 mL

## 2019-12-23 MED ORDER — DEXAMETHASONE SODIUM PHOSPHATE 10 MG/ML IJ SOLN
INTRAMUSCULAR | Status: DC | PRN
  Administered 2019-12-23: 10 mg via INTRAVENOUS

## 2019-12-23 MED ORDER — ONDANSETRON HCL 4 MG/2ML IJ SOLN: 4.0000 mg | Freq: Four times a day (QID) | INTRAMUSCULAR | Status: DC | PRN

## 2019-12-23 MED ORDER — METOCLOPRAMIDE HCL 5 MG/ML IJ SOLN: 5.0000 mg | Freq: Three times a day (TID) | INTRAMUSCULAR | Status: DC | PRN

## 2019-12-23 MED ORDER — PROPOFOL 10 MG/ML IV BOLUS
INTRAVENOUS | Status: AC
  Filled 2019-12-23: qty 20

## 2019-12-23 MED ORDER — TRANEXAMIC ACID 1000 MG/10ML IV SOLN
INTRAVENOUS | Status: DC | PRN
  Administered 2019-12-23: 1000 mg via TOPICAL

## 2019-12-23 MED ORDER — ACETAMINOPHEN 10 MG/ML IV SOLN
INTRAVENOUS | Status: AC
  Filled 2019-12-23: qty 100

## 2019-12-23 MED ORDER — ONDANSETRON HCL 4 MG/2ML IJ SOLN
INTRAMUSCULAR | Status: AC
  Filled 2019-12-23: qty 2

## 2019-12-23 MED ORDER — PHENYLEPHRINE HCL (PRESSORS) 10 MG/ML IV SOLN
INTRAVENOUS | Status: AC
  Filled 2019-12-23: qty 1

## 2019-12-23 MED ORDER — CEFAZOLIN SODIUM-DEXTROSE 2-4 GM/100ML-% IV SOLN
INTRAVENOUS | Status: AC
  Filled 2019-12-23: qty 100

## 2019-12-23 MED ORDER — PROPOFOL 500 MG/50ML IV EMUL
INTRAVENOUS | Status: DC | PRN
  Administered 2019-12-23: 100 ug/kg/min via INTRAVENOUS

## 2019-12-23 MED ORDER — ACETAMINOPHEN 500 MG PO TABS
ORAL_TABLET | ORAL | Status: AC
  Filled 2019-12-23: qty 2

## 2019-12-23 MED ORDER — FENTANYL CITRATE (PF) 100 MCG/2ML IJ SOLN
INTRAMUSCULAR | Status: AC
  Filled 2019-12-23: qty 2

## 2019-12-23 MED ORDER — BUPIVACAINE HCL (PF) 0.5 % IJ SOLN
INTRAMUSCULAR | Status: AC
  Filled 2019-12-23: qty 30

## 2019-12-23 MED ORDER — FENTANYL CITRATE (PF) 100 MCG/2ML IJ SOLN: 25.0000 ug | INTRAMUSCULAR | Status: DC | PRN

## 2019-12-23 MED ORDER — FENTANYL CITRATE (PF) 100 MCG/2ML IJ SOLN
INTRAMUSCULAR | Status: DC | PRN
  Administered 2019-12-23: 50 ug via INTRAVENOUS

## 2019-12-23 MED ORDER — PROPOFOL 500 MG/50ML IV EMUL
INTRAVENOUS | Status: AC
  Filled 2019-12-23: qty 50

## 2019-12-23 MED ORDER — ACETAMINOPHEN 500 MG PO TABS
1000.0000 mg | ORAL_TABLET | Freq: Four times a day (QID) | ORAL | Status: DC
  Administered 2019-12-23: 1000 mg via ORAL

## 2019-12-23 MED ORDER — BUPIVACAINE HCL (PF) 0.5 % IJ SOLN
INTRAMUSCULAR | Status: DC | PRN
  Administered 2019-12-23: 8 mL

## 2019-12-23 MED ORDER — KETOROLAC TROMETHAMINE 15 MG/ML IJ SOLN: 15.0000 mg | Freq: Once | INTRAMUSCULAR | Status: AC

## 2019-12-23 MED ORDER — EPINEPHRINE PF 1 MG/ML IJ SOLN
INTRAMUSCULAR | Status: AC
  Filled 2019-12-23: qty 2

## 2019-12-23 MED ORDER — OXYCODONE HCL 5 MG PO TABS
5.0000 mg | ORAL_TABLET | ORAL | 0 refills | Status: DC | PRN
Start: 2019-12-23 — End: 2020-07-12

## 2019-12-23 MED ORDER — ONDANSETRON HCL 4 MG PO TABS: 4.0000 mg | ORAL_TABLET | Freq: Four times a day (QID) | ORAL | Status: DC | PRN

## 2019-12-23 MED ORDER — SUCCINYLCHOLINE CHLORIDE 200 MG/10ML IV SOSY
PREFILLED_SYRINGE | INTRAVENOUS | Status: AC
  Filled 2019-12-23: qty 10

## 2019-12-23 MED ORDER — ONDANSETRON HCL 4 MG/2ML IJ SOLN
INTRAMUSCULAR | Status: DC | PRN
  Administered 2019-12-23: 4 mg via INTRAVENOUS

## 2019-12-23 MED ORDER — APIXABAN 2.5 MG PO TABS: 2.5000 mg | ORAL_TABLET | Freq: Two times a day (BID) | ORAL | 0 refills | Status: DC

## 2019-12-23 MED ORDER — OXYCODONE HCL 5 MG PO TABS
5.0000 mg | ORAL_TABLET | ORAL | Status: DC | PRN
  Administered 2019-12-23: 5 mg via ORAL
  Filled 2019-12-23 (×2): qty 2

## 2019-12-23 MED ORDER — METOCLOPRAMIDE HCL 10 MG PO TABS: 5.0000 mg | ORAL_TABLET | Freq: Three times a day (TID) | ORAL | Status: DC | PRN

## 2019-12-23 MED ORDER — TRIAMCINOLONE ACETONIDE 40 MG/ML IJ SUSP
INTRAMUSCULAR | Status: DC | PRN
  Administered 2019-12-23: 10 mL via SUBCUTANEOUS

## 2019-12-23 MED ORDER — TRANEXAMIC ACID 1000 MG/10ML IV SOLN
INTRAVENOUS | Status: AC
  Filled 2019-12-23: qty 20

## 2019-12-23 MED ORDER — SODIUM CHLORIDE 0.9 % BOLUS PEDS
250.0000 mL | Freq: Once | INTRAVENOUS | Status: AC
  Administered 2019-12-23: 250 mL via INTRAVENOUS

## 2019-12-23 MED ORDER — OXYCODONE HCL 5 MG PO TABS
ORAL_TABLET | ORAL | Status: AC
  Filled 2019-12-23: qty 1

## 2019-12-23 MED ORDER — LIDOCAINE HCL (PF) 2 % IJ SOLN
INTRAMUSCULAR | Status: AC
  Filled 2019-12-23: qty 5

## 2019-12-23 MED ORDER — MIDAZOLAM HCL 5 MG/5ML IJ SOLN
INTRAMUSCULAR | Status: DC | PRN
  Administered 2019-12-23 (×2): 1 mg via INTRAVENOUS

## 2019-12-23 MED ORDER — ACETAMINOPHEN 10 MG/ML IV SOLN
INTRAVENOUS | Status: DC | PRN
  Administered 2019-12-23: 1000 mg via INTRAVENOUS

## 2019-12-23 MED ORDER — SODIUM CHLORIDE 0.9 % IV SOLN
INTRAVENOUS | Status: DC | PRN
  Administered 2019-12-23: 60 mL

## 2019-12-23 MED ORDER — SODIUM CHLORIDE (PF) 0.9 % IJ SOLN
INTRAMUSCULAR | Status: AC
  Filled 2019-12-23: qty 100

## 2019-12-23 MED ORDER — GLYCOPYRROLATE 0.2 MG/ML IJ SOLN
INTRAMUSCULAR | Status: DC | PRN
  Administered 2019-12-23: .2 mg via INTRAVENOUS

## 2019-12-23 MED ORDER — CEFAZOLIN SODIUM-DEXTROSE 2-4 GM/100ML-% IV SOLN
2.0000 g | Freq: Four times a day (QID) | INTRAVENOUS | Status: DC
  Administered 2019-12-23: 2 g via INTRAVENOUS

## 2019-12-23 MED ORDER — SODIUM CHLORIDE 0.9 % IV SOLN: INTRAVENOUS | Status: DC

## 2019-12-23 MED ORDER — TRIAMCINOLONE ACETONIDE 40 MG/ML IJ SUSP
INTRAMUSCULAR | Status: AC
  Filled 2019-12-23: qty 2

## 2019-12-23 MED ORDER — MIDAZOLAM HCL 2 MG/2ML IJ SOLN
INTRAMUSCULAR | Status: AC
  Filled 2019-12-23: qty 2

## 2019-12-23 MED ORDER — BUPIVACAINE HCL (PF) 0.5 % IJ SOLN
INTRAMUSCULAR | Status: AC
  Filled 2019-12-23: qty 10

## 2019-12-23 MED ORDER — CHLORHEXIDINE GLUCONATE 0.12 % MT SOLN
OROMUCOSAL | Status: AC
  Administered 2019-12-23: 15 mL via OROMUCOSAL
  Filled 2019-12-23: qty 15

## 2019-12-23 MED ORDER — BUPIVACAINE HCL (PF) 0.5 % IJ SOLN
INTRAMUSCULAR | Status: DC | PRN
  Administered 2019-12-23: 2.6 mL

## 2019-12-23 MED ORDER — BUPIVACAINE HCL (PF) 0.5 % IJ SOLN
INTRAMUSCULAR | Status: AC
  Filled 2019-12-23: qty 60

## 2019-12-23 MED ORDER — BUPIVACAINE-EPINEPHRINE (PF) 0.5% -1:200000 IJ SOLN
INTRAMUSCULAR | Status: DC | PRN
  Administered 2019-12-23: 30 mL

## 2019-12-23 MED ORDER — BUPIVACAINE LIPOSOME 1.3 % IJ SUSP
INTRAMUSCULAR | Status: AC
  Filled 2019-12-23: qty 40

## 2019-12-23 MED ORDER — SODIUM CHLORIDE 0.9 % IV SOLN
INTRAVENOUS | Status: DC | PRN
  Administered 2019-12-23: 20 ug/min via INTRAVENOUS

## 2019-12-23 MED ORDER — ONDANSETRON HCL 4 MG/2ML IJ SOLN: 4.0000 mg | Freq: Once | INTRAMUSCULAR | Status: DC | PRN

## 2019-12-23 MED ORDER — KETOROLAC TROMETHAMINE 15 MG/ML IJ SOLN: 15.0000 mg | Freq: Four times a day (QID) | INTRAMUSCULAR | Status: DC

## 2019-12-23 SURGICAL SUPPLY — 60 items
APL PRP STRL LF DISP 70% ISPRP (MISCELLANEOUS) ×2
BIT DRILL QUICK REL 1/8 2PK SL (DRILL) ×2 IMPLANT
BLADE SAW SAG 25X90X1.19 (BLADE) ×4 IMPLANT
BLADE SURG SZ20 CARB STEEL (BLADE) ×4 IMPLANT
BNDG CMPR STD VLCR NS LF 5.8X6 (GAUZE/BANDAGES/DRESSINGS) ×2
BNDG ELASTIC 6X5.8 VLCR NS LF (GAUZE/BANDAGES/DRESSINGS) ×4 IMPLANT
BRNG TIB 79X10 ANT STAB MDLR (Insert) ×2 IMPLANT
CANISTER SUCT 1200ML W/VALVE (MISCELLANEOUS) ×4 IMPLANT
CANISTER SUCT 3000ML PPV (MISCELLANEOUS) ×4 IMPLANT
CEMENT BONE R 1X40 (Cement) ×8 IMPLANT
CEMENT VACUUM MIXING SYSTEM (MISCELLANEOUS) ×4 IMPLANT
CHLORAPREP W/TINT 26 (MISCELLANEOUS) ×4 IMPLANT
COMP FEMORAL CRUC RIGHT 75MM (Joint) ×4 IMPLANT
COMPONENT FEMRL CRUC RT 75MM (Joint) ×2 IMPLANT
COOLER POLAR GLACIER W/PUMP (MISCELLANEOUS) ×4 IMPLANT
COVER MAYO STAND REUSABLE (DRAPES) ×4 IMPLANT
COVER WAND RF STERILE (DRAPES) ×4 IMPLANT
CUFF TOURN SGL QUICK 34 (TOURNIQUET CUFF) ×4
CUFF TRNQT CYL 34X4.125X (TOURNIQUET CUFF) ×2 IMPLANT
DRAPE 3/4 80X56 (DRAPES) ×4 IMPLANT
DRAPE IMP U-DRAPE 54X76 (DRAPES) ×4 IMPLANT
DRILL QUICK RELEASE 1/8 INCH (DRILL) ×2
DRSG OPSITE POSTOP 4X10 (GAUZE/BANDAGES/DRESSINGS) ×4 IMPLANT
DRSG OPSITE POSTOP 4X8 (GAUZE/BANDAGES/DRESSINGS) ×4 IMPLANT
ELECT REM PT RETURN 9FT ADLT (ELECTROSURGICAL) ×4
ELECTRODE REM PT RTRN 9FT ADLT (ELECTROSURGICAL) ×2 IMPLANT
GLOVE BIO SURGEON STRL SZ7.5 (GLOVE) ×16 IMPLANT
GLOVE BIO SURGEON STRL SZ8 (GLOVE) ×16 IMPLANT
GLOVE BIOGEL PI IND STRL 8 (GLOVE) ×2 IMPLANT
GLOVE BIOGEL PI INDICATOR 8 (GLOVE) ×2
GLOVE INDICATOR 8.0 STRL GRN (GLOVE) ×4 IMPLANT
GOWN STRL REUS W/ TWL LRG LVL3 (GOWN DISPOSABLE) ×2 IMPLANT
GOWN STRL REUS W/ TWL XL LVL3 (GOWN DISPOSABLE) ×2 IMPLANT
GOWN STRL REUS W/TWL LRG LVL3 (GOWN DISPOSABLE) ×4
GOWN STRL REUS W/TWL XL LVL3 (GOWN DISPOSABLE) ×4
HOOD PEEL AWAY FLYTE STAYCOOL (MISCELLANEOUS) ×12 IMPLANT
INSERT TIB BEARING 79X10 (Insert) ×4 IMPLANT
KIT TURNOVER KIT A (KITS) ×4 IMPLANT
NEEDLE SPNL 20GX3.5 QUINCKE YW (NEEDLE) ×4 IMPLANT
NS IRRIG 1000ML POUR BTL (IV SOLUTION) ×4 IMPLANT
PACK TOTAL KNEE (MISCELLANEOUS) ×4 IMPLANT
PAD WRAPON POLAR KNEE (MISCELLANEOUS) ×2 IMPLANT
PEG PATELLA SERIES A 37MMX10MM (Orthopedic Implant) ×4 IMPLANT
PENCIL SMOKE EVACUATOR (MISCELLANEOUS) ×4 IMPLANT
PLATE KNEE TIBIAL 79MM FIXED (Plate) ×4 IMPLANT
PULSAVAC PLUS IRRIG FAN TIP (DISPOSABLE) ×4
SOL .9 NS 3000ML IRR  AL (IV SOLUTION) ×2
SOL .9 NS 3000ML IRR AL (IV SOLUTION) ×2
SOL .9 NS 3000ML IRR UROMATIC (IV SOLUTION) ×2 IMPLANT
STAPLER SKIN PROX 35W (STAPLE) ×4 IMPLANT
SUCTION FRAZIER HANDLE 10FR (MISCELLANEOUS) ×2
SUCTION TUBE FRAZIER 10FR DISP (MISCELLANEOUS) ×2 IMPLANT
SUT VIC AB 0 CT1 36 (SUTURE) ×12 IMPLANT
SUT VIC AB 2-0 CT1 27 (SUTURE) ×16
SUT VIC AB 2-0 CT1 TAPERPNT 27 (SUTURE) ×8 IMPLANT
SYR 10ML LL (SYRINGE) ×4 IMPLANT
SYR 20ML LL LF (SYRINGE) ×4 IMPLANT
SYR 30ML LL (SYRINGE) ×4 IMPLANT
TIP FAN IRRIG PULSAVAC PLUS (DISPOSABLE) ×2 IMPLANT
WRAPON POLAR PAD KNEE (MISCELLANEOUS) ×4

## 2019-12-23 NOTE — Evaluation (Signed)
Physical Therapy Evaluation Patient Details Name: Raymond [name] MRN: [id number] DOB: [birth year] Today's Date: 12/23/2019   History of Present Illness  seen in post-op area s/p R TKR, WBAT (12/23/19)  Clinical Impression  Upon evaluation, patient alert and oriented; follows commands and eager for participation with session.  Wife at bedside; engaged and supportive throughout.  Patient endorses good pain control to R LE; mild sensory deficit remains over distal tibia, otherwise grossly WFL.  Demonstrates good post-op strength (at least 3-/5) and ROM (4-83 degrees) to R knee; good isolated control and movement with assessment and therex.  Able to complete bed mobility with mod indep; sit/stand, basic transfers and gait (125') with RW, cga/close sup.  Demonstrates slow and guarded with deliberate R LE advancement, but demonstrates reciprocal stepping pattern with fair/good R knee mechanics, fair/good R LE step height/length.  Good control and comfort with gait trials; minimal pain reported. Educated in role of PT and mobility progression; issued handout with written/pictorial descriptions of LE therex for use as HEP; reviewed positioning, use of polar care, car transfer techniques and recs for continued use of RW (with education in adjustment) at all times.  Patient/wife voiced understanding and agreement with all information.  Comfortable with patient performance, level of assist required and upcoming discharge home; no further questions at this time. Would benefit from skilled PT to address above deficits and promote optimal return to PLOF.; Recommend transition to HHPT upon discharge from acute hospitalization.       Follow Up Recommendations Home health PT    Equipment Recommendations   (has RW)    Recommendations for Other Services       Precautions / Restrictions Precautions Precautions: Fall Restrictions Weight Bearing Restrictions: Yes RLE Weight Bearing: Weight bearing as tolerated       Mobility  Bed Mobility Overal bed mobility: Modified Independent                  Transfers Overall transfer level: Needs assistance Equipment used: Rolling walker (2 wheeled) Transfers: Sit to/from Stand Sit to Stand: Supervision         General transfer comment: cuing for hand placement, R LE placement (to maximize pain control)  Ambulation/Gait Ambulation/Gait assistance: Supervision Gait Distance (Feet): 125 Feet Assistive device: Rolling walker (2 wheeled)       General Gait Details: slow and guarded with deliberate R LE advancement, but demonstrates reciprocal stepping pattern with fair/good R knee mechanics, fair/good R LE step height/length.  Good control and comfort with gait trials; minimal pain reported  Stairs Stairs: Yes Stairs assistance: Supervision;Min guard Stair Management: With walker Number of Stairs: 3 General stair comments: educated in stair negotiation with RW (ascending backwards); patient with good return demonstration of task.  Wife present to observe and voices understanding of technique, safety needs  Wheelchair Mobility    Modified Rankin (Stroke Patients Only)       Balance Overall balance assessment: Needs assistance Sitting-balance support: No upper extremity supported;Feet supported Sitting balance-Leahy Scale: Good     Standing balance support: Bilateral upper extremity supported Standing balance-Leahy Scale: Fair                               Pertinent Vitals/Pain Pain Assessment: Faces Faces Pain Scale: Hurts a little bit Pain Location: R knee Pain Descriptors / Indicators: Guarding;Aching Pain Intervention(s): Limited activity within patient's tolerance;Monitored during session;Repositioned;Premedicated before session    Home Living Family/patient expects  to be discharged to:: Private residence Living Arrangements: Spouse/significant other Available Help at Discharge: Family;Available  PRN/intermittently Type of Home: House Home Access: Stairs to enter Entrance Stairs-Rails: None Entrance Stairs-Number of Steps: 3 Home Layout: Two level;Bed/bath upstairs;Able to live on main level with bedroom/bathroom Home Equipment: Gilford Rile - 2 wheels Additional Comments: Plans to stay on main level of home immediately upon discharge    Prior Function Level of Independence: Independent         Comments: Indep with ADLs, household and community mobilization without assist device; denies fall history.     Hand Dominance        Extremity/Trunk Assessment   Upper Extremity Assessment Upper Extremity Assessment: Overall WFL for tasks assessed    Lower Extremity Assessment Lower Extremity Assessment:  (R knee strength at least 3-/5; ROM grossly 4-83 degrees, limited by pain.  Otherwise, bilat LEs grossly WFL)       Communication   Communication: No difficulties  Cognition Arousal/Alertness: Awake/alert Behavior During Therapy: WFL for tasks assessed/performed Overall Cognitive Status: Within Functional Limits for tasks assessed                                        General Comments      Exercises Total Joint Exercises Goniometric ROM: R knee: 4-83 degrees, act assist Other Exercises Other Exercises: Educated in role of PT and mobility progression; issued handout with written/pictorial descriptions of LE therex for use as HEP; reviewed positioning, use of polar care, car transfer techniques and recs for continued use of RW (with education in adjustment) at all times.  Patient/wife voiced understanding and agreement with all information   Assessment/Plan    PT Assessment Patient needs continued PT services  PT Problem List Decreased strength;Decreased range of motion;Decreased activity tolerance;Decreased balance;Decreased mobility;Decreased knowledge of use of DME;Decreased safety awareness;Decreased knowledge of precautions;Pain;Decreased skin  integrity       PT Treatment Interventions DME instruction;Gait training;Functional mobility training;Therapeutic activities;Stair training;Therapeutic exercise;Balance training;Patient/family education    PT Goals (Current goals can be found in the Care Plan section)  Acute Rehab PT Goals Patient Stated Goal: to go home! PT Goal Formulation: With patient/family Time For Goal Achievement: 01/06/20 Potential to Achieve Goals: Good    Frequency BID   Barriers to discharge        Co-evaluation               AM-PAC PT "6 Clicks" Mobility  Outcome Measure Help needed turning from your back to your side while in a flat bed without using bedrails?: None Help needed moving from lying on your back to sitting on the side of a flat bed without using bedrails?: None Help needed moving to and from a bed to a chair (including a wheelchair)?: A Little Help needed standing up from a chair using your arms (e.g., wheelchair or bedside chair)?: A Little Help needed to walk in hospital room?: A Little Help needed climbing 3-5 steps with a railing? : A Little 6 Click Score: 20    End of Session Equipment Utilized During Treatment: Gait belt Activity Tolerance: Patient tolerated treatment well Patient left: in chair;with call bell/phone within reach;with nursing/sitter in room;with family/visitor present Nurse Communication: Mobility status PT Visit Diagnosis: Muscle weakness (generalized) (M62.81);Other abnormalities of gait and mobility (R26.89);Pain Pain - Right/Left: Right Pain - part of body: Knee    Time: 8563-1497 PT Time Calculation (  min) (ACUTE ONLY): 41 min   Charges:   PT Evaluation $PT Eval Moderate Complexity: 1 Mod PT Treatments $Therapeutic Activity: 23-37 mins        Hallee Mckenny H. Owens Shark, PT, DPT, NCS 12/23/19, 5:32 PM [phone]

## 2019-12-23 NOTE — Transfer of Care (Signed)
Immediate Anesthesia Transfer of Care Note  Patient: Raymond [name]  Procedure(s) Performed: TOTAL KNEE ARTHROPLASTY (Right Knee) KNEE INJECTION (Left Knee)  Patient Location: PACU  Anesthesia Type:General and Spinal  Level of Consciousness: awake and patient cooperative  Airway & Oxygen Therapy: Patient Spontanous Breathing and Patient connected to face mask oxygen  Post-op Assessment: Report given to RN and Post -op Vital signs reviewed and stable  Post vital signs: Reviewed and stable  Last Vitals:  Vitals Value Taken Time  BP 105/73 12/23/19 1023  Temp    Pulse 71 12/23/19 1024  Resp 13 12/23/19 1024  SpO2 99 % 12/23/19 1024  Vitals shown include unvalidated device data.  Last Pain:  Vitals:   12/23/19 1023  TempSrc:   PainSc: (P) Asleep         Complications: No complications documented.

## 2019-12-23 NOTE — Anesthesia Procedure Notes (Signed)
Spinal  Patient location during procedure: OR Start time: 12/23/2019 7:35 AM End time: 12/23/2019 7:46 AM Staffing Performed: anesthesiologist  Anesthesiologist: Emmie Niemann, MD Preanesthetic Checklist Completed: patient identified, IV checked, site marked, risks and benefits discussed, surgical consent, monitors and equipment checked, pre-op evaluation and timeout performed Spinal Block Patient position: sitting Prep: ChloraPrep Patient monitoring: heart rate, continuous pulse ox and blood pressure Approach: midline Location: L4-5 Injection technique: single-shot Needle Needle type: Introducer and Pencil-Tip  Needle gauge: 24 G Needle length: 9 cm Additional Notes Negative paresthesia. Negative blood return. Positive free-flowing CSF. Expiration date of kit checked and confirmed. Patient tolerated procedure well, without complications.

## 2019-12-23 NOTE — Op Note (Addendum)
12/23/2019  10:27 AM  Patient:   Raymond [name]  Pre-Op Diagnosis:   1. Degenerative joint disease, right knee. 2. Degenerative joint disease left knee.  Post-Op Diagnosis:   Same  Procedure:   1. Right TKA using all-cemented [employer] Vanguard system with a 75 mm PCR femur, a 79 mm tibial tray with a 10 mm anterior stabilized E-poly insert, and a 37 x 10 mm all-poly 3-pegged domed patella. 2. Steroid injection, left knee.  Surgeon:   Pascal Lux, MD  Assistant:   Cameron Proud, PA-C; Sherlene Shams, PA-S  Anesthesia:   Spinal  Findings:   As above  Complications:   None  EBL:   20 cc  Fluids:   700 cc crystalloid  UOP:   None  TT:   105 minutes at 300 mmHg  Drains:   None  Closure:   Staples  Implants:   As above  Brief Clinical Note:   The patient is a 64 year old male with a long history of progressively worsening right knee pain. The patient's symptoms have progressed despite medications, activity modification, injections, etc. The patient's history and examination were consistent with advanced degenerative joint disease of the right knee confirmed by plain radiographs. The patient presents at this time for a right total knee arthroplasty.  Procedure:   The patient was brought into the operating room. After adequate spinal anesthesia was obtained, the patient was lain in the supine position. The right lower extremity was prepped with ChloraPrep solution and draped sterilely. Preoperative antibiotics were administered. After verifying the proper laterality with a surgical timeout, the limb was exsanguinated with an Esmarch and the tourniquet inflated to 300 mmHg.   A standard anterior approach to the knee was made through an approximately 7 inch incision. The incision was carried down through the subcutaneous tissues to expose superficial retinaculum. This was split the length of the incision and the medial flap elevated sufficiently to expose the medial retinaculum. The medial  retinaculum was incised, leaving a 3-4 mm cuff of tissue on the patella. This was extended distally along the medial border of the patellar tendon and proximally through the medial third of the quadriceps tendon. A subtotal fat pad excision was performed before the soft tissues were elevated off the anteromedial and anterolateral aspects of the proximal tibia to the level of the collateral ligaments. The anterior portions of the medial and lateral menisci were removed, as was the anterior cruciate ligament. With the knee flexed to 90, the external tibial guide was positioned and the appropriate proximal tibial cut made. This piece was taken to the back table where it was measured and found to be optimally replicated by a 79 mm component.  Attention was directed to the distal femur. The intramedullary canal was accessed through a 3/8" drill hole. The intramedullary guide was inserted and positioned in order to obtain a neutral flexion gap. The intercondylar block was positioned with care taken to avoid notching the anterior cortex of the femur. The appropriate cut was made. Next, the distal cutting block was placed at 5 of valgus alignment. Using the 9 mm slot, the distal cut was made. The distal femur was measured and found to be optimally replicated by the 75 mm component. The 75 mm 4-in-1 cutting block was positioned and first the posterior, then the posterior chamfer, the anterior chamfer, and finally the anterior cuts were made. At this point, the posterior portions medial and lateral menisci were removed. A trial reduction was performed using  the appropriate femoral and tibial components with the 10 mm insert. This demonstrated excellent stability to varus and valgus stressing both in flexion and extension while permitting full extension. Patella tracking was assessed and found to be excellent. Therefore, the tibial guide position was marked on the proximal tibia. The patella thickness was measured and  found to be 23 mm. Therefore, the appropriate cut was made. The patellar surface was measured and found to be optimally replicated by the 37 mm component. The three peg holes were drilled in place before the trial button was inserted. Patella tracking was assessed and found to be excellent, passing the "no thumb test". The lug holes were drilled into the distal femur before the trial component was removed, leaving only the tibial tray. The keel was then created using the appropriate tower, reamer, and punch.  The bony surfaces were prepared for cementing by irrigating them thoroughly with bacitracin saline solution via the jet lavage system. A bone plug was fashioned from some of the bone that had been removed previously and used to plug the distal femoral canal. In addition, 20 cc of Exparel diluted out to 60 cc with normal saline and 30 cc of 0.5% Sensorcaine were injected into the postero-medial and postero-lateral aspects of the knee, the medial and lateral gutter regions, and the peri-incisional tissues to help with postoperative analgesia. Meanwhile, the cement was being mixed on the back table. When it was ready, the tibial tray was cemented in first. The excess cement was removed using [employer] fast streamer. Next, the femoral component was impacted into place. Again, the excess cement was removed using [employer] fast streamer. The 10 mm trial insert was positioned and the knee brought into extension while the cement hardened. Finally, the patella was cemented into place and secured using the patellar clamp. Again, the excess cement was removed using [employer] fast streamer. Once the cement had hardened, the knee was placed through a range of motion with the findings as described above. Therefore, the trial insert was removed and, after verifying that no cement had been retained posteriorly, the permanent 10 mm anterior stabilized E-polyethylene insert was positioned and secured using the appropriate key locking mechanism. Again  the knee was placed through a range of motion with the findings as described above.  The wound was copiously irrigated with sterile saline solution using the jet lavage system before the quadriceps tendon and retinacular layer were reapproximated using #0 Vicryl interrupted sutures. The superficial retinacular layer also was closed using a running #0 Vicryl suture. A total of 10 cc of transexemic acid (TXA) was injected intra-articularly before the subcutaneous tissues were closed in several layers using 2-0 Vicryl interrupted sutures. The skin was closed using staples. A sterile honeycomb dressing was applied to the skin before the leg was wrapped with an Ace wrap to accommodate the Polar Care device.   At the conclusion of the case, the left knee was injected sterilely using a solution of 2 cc of Kenalog-40 and 8 cc of 0.5% Sensorcaine with epinephrine per the patient's request. The patient was then awakened and returned to the recovery room in satisfactory condition after tolerating the procedure well.

## 2019-12-23 NOTE — Discharge Instructions (Addendum)
Orthopedic discharge instructions: May shower with intact op-site dressing. Apply ice frequently to knee or use Polar Care. Take Eliquis 2.5 mg BID for 2 weeks, then start aspirin 325 mg daily for 4 weeks.   BID = twice daily (every 12 hours) Take oxycodone as prescribed when needed.  May supplement with ES Tylenol if necessary.               TYLENOL 1000 MG GIVEN AT HOSPITAL AT 3:28 PM. May weight-bear as tolerated - use walker or crutches as needed for balance and support. Follow-up in 10-14 days or as scheduled.  Bupivacaine Liposomal Suspension for Injection What is this medicine? BUPIVACAINE LIPOSOMAL (bue PIV a kane LIP oh som al) is an anesthetic. It causes loss of feeling in the skin or other tissues. It is used to prevent and to treat pain from some procedures. This medicine may be used for other purposes; ask your health care provider or pharmacist if you have questions. COMMON BRAND NAME(S): EXPAREL What should I tell my health care provider before I take this medicine? They need to know if you have any of these conditions:  G6PD deficiency  heart disease  kidney disease  liver disease  low blood pressure  lung or breathing disease, like asthma  an unusual or allergic reaction to bupivacaine, other medicines, foods, dyes, or preservatives  pregnant or trying to get pregnant  breast-feeding How should I use this medicine? This medicine is for injection into the affected area. It is given by a health care professional in a hospital or clinic setting. Talk to your pediatrician regarding the use of this medicine in children. Special care may be needed. Overdosage: If you think you have taken too much of this medicine contact a poison control center or emergency room at once. NOTE: This medicine is only for you. Do not share this medicine with others. What if I miss a dose? This does not apply. What may interact with this medicine? This medicine may interact with the  following medications:  acetaminophen  certain antibiotics like dapsone, nitrofurantoin, aminosalicylic acid, sulfonamides  certain medicines for seizures like phenobarbital, phenytoin, valproic acid  chloroquine  cyclophosphamide  flutamide  hydroxyurea  ifosfamide  metoclopramide  nitric oxide  nitroglycerin  nitroprusside  nitrous oxide  other local anesthetics like lidocaine, pramoxine, tetracaine  primaquine  quinine  rasburicase  sulfasalazine This list may not describe all possible interactions. Give your health care provider a list of all the medicines, herbs, non-prescription drugs, or dietary supplements you use. Also tell them if you smoke, drink alcohol, or use illegal drugs. Some items may interact with your medicine. What should I watch for while using this medicine? Your condition will be monitored carefully while you are receiving this medicine. Be careful to avoid injury while the area is numb, and you are not aware of pain. What side effects may I notice from receiving this medicine? Side effects that you should report to your doctor or health care professional as soon as possible:  allergic reactions like skin rash, itching or hives, swelling of the face, lips, or tongue  seizures  signs and symptoms of a dangerous change in heartbeat or heart rhythm like chest pain; dizziness; fast, irregular heartbeat; palpitations; feeling faint or lightheaded; falls; breathing problems  signs and symptoms of methemoglobinemia such as pale, gray, or blue colored skin; headache; fast heartbeat; shortness of breath; feeling faint or lightheaded, falls; tiredness Side effects that usually do not require medical attention (  report to your doctor or health care professional if they continue or are bothersome):  anxious  back pain  changes in taste  changes in vision  constipation  dizziness  fever  nausea, vomiting This list may not describe all  possible side effects. Call your doctor for medical advice about side effects. You may report side effects to FDA at 1-800-FDA-1088. Where should I keep my medicine? This drug is given in a hospital or clinic and will not be stored at home. NOTE: This sheet is a summary. It may not cover all possible information. If you have questions about this medicine, talk to your doctor, pharmacist, or health care provider.  2020 Elsevier/Gold Standard (2018-11-19 10:48:23)   AMBULATORY SURGERY  DISCHARGE INSTRUCTIONS   1) The drugs that you were given will stay in your system until tomorrow so for the next 24 hours you should not:  A) Drive an automobile B) Make any legal decisions C) Drink any alcoholic beverage   2) You may resume regular meals tomorrow.  Today it is better to start with liquids and gradually work up to solid foods.  You may eat anything you prefer, but it is better to start with liquids, then soup and crackers, and gradually work up to solid foods.   3) Please notify your doctor immediately if you have any unusual bleeding, trouble breathing, redness and pain at the surgery site, drainage, fever, or pain not relieved by medication.    4) Additional Instructions:        Please contact your physician with any problems or Same Day Surgery at [phone], Monday through Friday 6 am to 4 pm, or  at [facility] number at [phone].

## 2019-12-23 NOTE — H&P (Signed)
History of Present Illness:  Raymond [name] is a 64 y.o. male who presents for follow-up of his right knee pain secondary to advanced degenerative joint disease. The patient was last seen for the symptoms by me over 2 years ago, shortly after completing a series of viscosupplementation injections. The patient notes that these injections did provide temporary partial relief of his symptoms. However, he again is having moderate pain in his knee which he rates as high as 6/10. The symptoms are aggravated by any prolonged standing or ambulation. He also has difficulty reciprocating stairs. He has been taking Mobic on a daily basis, as well as additional over-the-counter medications as needed with temporary partial relief of his symptoms. He denies any reinjury to the knee, and denies any numbness or paresthesias down his leg to his foot. He is becoming increasingly frustrated by his persistent symptoms and function limitations, and is ready to consider more aggressive treatment options.  Current Outpatient Medications: . ALPRAZolam (XANAX) 0.5 MG tablet TAKE 1ST TAB 1 HOUR PRIOR TO PROCEDURE. TAKE 2ND TAB UPON ARRIVAL  . amLODIPine (NORVASC) 10 MG tablet Take 10 mg by mouth once daily.  Marland Kitchen amoxicillin (AMOXIL) 500 MG capsule TAKE 4 CAPSULES BY MOUTH 1 HOUR PRIOR TO DENTAL TREATMENT  . aspirin 81 MG EC tablet Take 81 mg by mouth once daily.  Marland Kitchen buPROPion (WELLBUTRIN) 75 MG tablet Take 75 mg by mouth 2 (two) times daily.  . cyclobenzaprine (FLEXERIL) 10 MG tablet Take 1 tablet (10 mg total) by mouth 2 (two) times daily as needed for Muscle spasms 45 tablet 3  . cyclobenzaprine (FLEXERIL) 10 MG tablet TAKE 1 TABLET BY MOUTH TWICE A DAY AS NEEDED FOR MUSCLE SPASMS  . esomeprazole (NEXIUM) 40 MG DR capsule Take 40 mg by mouth once daily.  Marland Kitchen gabapentin (NEURONTIN) 300 MG capsule Take 1 capsule (300 mg total) by mouth 3 (three) times daily for 90 days 270 capsule 3  . glipiZIDE (GLUCOTROL) 5 MG tablet Take 5 mg by  mouth 2 (two) times daily before meals  . losartan (COZAAR) 100 MG tablet Take 1 tablet (100 mg total) by mouth once daily 90 tablet 4  . meclizine (ANTIVERT) 25 mg tablet TAKE 1/2 TO 1 TABLET BY MOUTH EVERY 8 HOURS AS NEEDED FOR DIZZINESS  . meloxicam (MOBIC) 15 MG tablet TAKE 1 TABLET BY MOUTH EVERY DAY 30 tablet 1  . metFORMIN (GLUCOPHAGE) 500 MG tablet TAKE 1 TABLET BY MOUTH TWICE A DAY WITH MEALS  . promethazine (PHENERGAN) 25 MG tablet TAKE 1 TABLET BY MOUTH EVERY 8 HOURS AS NEEDED FOR NAUSEA  . rosuvastatin (CRESTOR) 10 MG tablet  . sertraline (ZOLOFT) 50 MG tablet Take 50 mg by mouth once daily  . tiZANidine (ZANAFLEX) 2 MG tablet TAKE 1 TABLET (2 MG TOTAL) BY MOUTH NIGHTLY AS NEEDED 30 tablet 1  . triamterene-hydrochlorothiazide (DYAZIDE) 37.5-25 mg capsule Take 1 capsule by mouth once daily   No current Epic-ordered facility-administered medications on file.   Allergies: No Known Allergies  Past Medical History:  . Arthrosis of knee 08/14/2013  . Benign essential HTN 07/26/2016  . Bicipital tendinitis of left shoulder 07/21/2014  . Chickenpox  . Depression  . Diabetes mellitus type 2, uncomplicated (CMS-HCC)  . GERD (gastroesophageal reflux disease) 00/00/0000  . Hyperlipidemia, mixed 07/26/2016  . Hypertension  . Impingement syndrome of left shoulder 07/21/2014  . Lumbar radiculitis 08/14/2013  . Primary osteoarthritis of left hip 11/03/2015  . Primary osteoarthritis of right knee 01/24/2017  .  Sleep apnea  . Stable angina pectoris (CMS-HCC) 07/26/2016  . Status post total hip replacement, left 01/26/2016  . Uncontrolled type 2 diabetes mellitus without complication, without long-term current use of insulin 09/12/2016   Past Surgical History:  . Arthroscopic debridement of chondral lesions left shoulder along with arthroscopic removal of loose body and arthroscopic release of the long head of the biceps tendon Left 08/10/2014 Dr. Jefm Bryant  . Arthroscopic partial medial menscectomy,  arthroscopic debridement, and arthroscopic abrasion chondroplasty of medial femoral condyle, lateral tibial plateau, and femoral trochlea,left knee Left 07/25/2017 Dr.Gerard Cantara  . CARPAL TUNNEL RELEASE 12/22/2018  . CHOLECYSTECTOMY 1988  . Endoscopic right carpal tunnel release. Right 01/02/2019 Dr.Nazareth Norenberg  . ENDOVENOUS ABLATION SAPHENOUS VEIN W/ LASER  . JOINT REPLACEMENT Left total hip  . KNEE ARTHROSCOPY Right  DIAGNOSTIC AND OPERATIVE ARTHROSCOPY WITH PARTIAL MEDIAL AND LATERAL MENISECTOMY  . KNEE CHONDROPLASTIC SHAVING PATELLOFEMORAL JOINT 10/05/2009  . left shoulder Left 08/10/2014   Family History:  Marland Kitchen Myocardial Infarction (Heart attack) Mother  . High blood pressure (Hypertension) Mother  . Clotting disorder Mother  . Coronary Artery Disease (Blocked arteries around heart) Mother  . Dementia Mother  . Stroke Mother  . High blood pressure (Hypertension) Father  . Dementia Father  . Myocardial Infarction (Heart attack) Brother  . No Known Problems Maternal Grandmother  . No Known Problems Maternal Grandfather  . No Known Problems Paternal Grandmother  . Diabetes Paternal Grandfather  . Alcohol abuse Brother  . No Known Problems Daughter  . Asthma Son   Social History:   Socioeconomic History:  Marland Kitchen Marital status: Married  Spouse name: Not on file  . Number of children: 2  . Years of education: Not on file  . Highest education level: Not on file  Occupational History  . Occupation: Garment/textile technologist  Tobacco Use  . Smoking status: Never Smoker  . Smokeless tobacco: Never Used  Vaping Use  . Vaping Use: Never used  Substance and Sexual Activity  . Alcohol use: Yes  Alcohol/week: 0.0 standard drinks  Comment: once in a while  . Drug use: Never  . Sexual activity: Yes  Partners: Female  Birth control/protection: Surgical, None  Other Topics Concern  . Not on file  Social History Narrative  . Not on file   Social Determinants of Health:   Financial Resource Strain:  .  Difficulty of Paying Living Expenses:  Food Insecurity:  . Worried About Charity fundraiser in the Last Year:  . Arboriculturist in the Last Year:  Transportation Needs:  . Film/video editor (Medical):  Marland Kitchen Lack of Transportation (Non-Medical):   Review of Systems:  A comprehensive 14 point ROS was performed, reviewed, and the pertinent orthopaedic findings are documented in the HPI.  Physical Exam: Vitals:  11/26/19 1025  BP: 136/80  Weight: (!) 117 kg (258 lb)  Height: 185.4 cm (6\' 1" )  PainSc: 6  PainLoc: Knee   General/Constitutional: The patient appears to be well-nourished, well-developed, and in no acute distress. Neuro/Psych: Normal mood and affect, oriented to person, place and time. Eyes: Non-icteric. Pupils are equal, round, and reactive to light, and exhibit synchronous movement. ENT: Unremarkable. Lymphatic: No palpable adenopathy. Respiratory: Lungs clear to auscultation, Normal chest excursion, No wheezes and Non-labored breathing Cardiovascular: Regular rate and rhythm. No murmurs. and No edema, swelling or tenderness, except as noted in detailed exam. Integumentary: No impressive skin lesions present, except as noted in detailed exam. Musculoskeletal: Unremarkable, except as noted in detailed  exam.  Right knee exam: GAIT: moderate limp and uses no assistive devices. ALIGNMENT: moderate varus SKIN: unremarkable SWELLING: mild EFFUSION: small WARMTH: no warmth TENDERNESS: moderate over the medial joint line, mild tenderness along lateral joint line ROM: 10 to 100 degrees with moderate pain in maximal flexion McMURRAY'S: equivocal PATELLOFEMORAL: normal tracking with no peri-patellar tenderness and negative apprehension sign CREPITUS: no LACHMAN'S: negative PIVOT SHIFT: negative ANTERIOR DRAWER: negative POSTERIOR DRAWER: negative VARUS/VALGUS: positive pseudolaxity to varus stressing  He is neurovascularly intact to the right lower extremity and  foot.  X-rays/MRI/Lab data:  Standing AP and lateral x-rays of the right knee, as well as a sunrise view, are obtained. These films demonstrate severe degenerative changes, primarily involving the medial compartment with 100% medial joint space narrowing. Overall alignment is moderate varus. No fractures, lytic lesions, or abnormal calcifications are noted.  Assessment: Primary osteoarthritis of right knee.  Plan: The treatment options were discussed with the patient. In addition, patient educational materials were provided regarding the diagnosis and treatment options. The patient is quite frustrated by his persistent symptoms and function limitations, and is ready to consider more aggressive treatment options. Therefore, I have recommended a surgical procedure, specifically a right total knee arthroplasty. The procedure was discussed with the patient, as were the potential risks (including bleeding, infection, nerve and/or blood vessel injury, persistent or recurrent pain, loosening and/or failure of the components, dislocation, need for further surgery, blood clots, strokes, heart attacks and/or arhythmias, pneumonia, etc.) and benefits. The patient states his/her understanding and wishes to proceed. All of the patient's questions and concerns were answered. He can call any time with further concerns. He will follow up post-surgery, routine.   H&P reviewed and patient re-examined. No changes.

## 2019-12-23 NOTE — OR Nursing (Addendum)
Dr. Roland Rack in to see pt/spouse 143 pm; advises he doesn't need to see pt again in postop prior to discharge. He's aware pt got 2nd dose of Ancef; advises that if pt ready to d/c prior to 2nd toradol dose and 2nd tylenol dose ok to discharge to home without receiving.  Physical Therapy advises they will be up to work with patient soon.  Pt advises he has rolling walker at home already and that spouse is going to get raised toilet seat (refused BSC).    Update 1430 - PT advises "running late".  Update 1445 - PT in for eval.

## 2019-12-23 NOTE — Anesthesia Postprocedure Evaluation (Signed)
Anesthesia Post Note  Patient: Raymond [name]  Procedure(s) Performed: TOTAL KNEE ARTHROPLASTY (Right Knee) KNEE INJECTION (Left Knee)  Patient location during evaluation: PACU Anesthesia Type: Spinal Level of consciousness: oriented and awake and alert Pain management: pain level controlled Vital Signs Assessment: post-procedure vital signs reviewed and stable Respiratory status: spontaneous breathing, respiratory function stable and nonlabored ventilation Cardiovascular status: blood pressure returned to baseline and stable Postop Assessment: no headache, no backache and spinal receding Anesthetic complications: no   No complications documented.   Last Vitals:  Vitals:   12/23/19 1355 12/23/19 1432  BP: (!) 119/56 124/65  Pulse: 64 (!) 56  Resp: 16   Temp:    SpO2: 98% 99%    Last Pain:  Vitals:   12/23/19 1432  TempSrc:   PainSc: 2                  Morena Mckissack

## 2019-12-23 NOTE — OR Nursing (Signed)
PT in for evaluation @ 1445.

## 2019-12-23 NOTE — Anesthesia Preprocedure Evaluation (Addendum)
Anesthesia Evaluation  Patient identified by MRN, date of birth, ID band Patient awake    Reviewed: Allergy & Precautions, NPO status , Patient's Chart, lab work & pertinent test results  History of Anesthesia Complications Negative for: history of anesthetic complications  Airway Mallampati: III  TM Distance: >3 FB Neck ROM: Full    Dental  (+) Poor Dentition   Pulmonary asthma , sleep apnea and Continuous Positive Airway Pressure Ventilation ,    breath sounds clear to auscultation- rhonchi (-) wheezing      Cardiovascular hypertension, Pt. on medications (-) CAD, (-) Past MI, (-) Cardiac Stents and (-) CABG  Rhythm:Regular Rate:Normal - Systolic murmurs and - Diastolic murmurs    Neuro/Psych neg Seizures PSYCHIATRIC DISORDERS Depression CVA, No Residual Symptoms    GI/Hepatic Neg liver ROS, GERD  ,  Endo/Other  diabetes, Oral Hypoglycemic Agents  Renal/GU negative Renal ROS     Musculoskeletal  (+) Arthritis ,   Abdominal (+) + obese,   Peds  Hematology negative hematology ROS (+)   Anesthesia Other Findings Past Medical History: No date: Allergy No date: Arthritis No date: Asthma     Comment:  mild No date: Carotid stenosis No date: Carpal tunnel syndrome     Comment:  bilateral No date: Depression No date: Diabetes mellitus without complication (HCC)     Comment:  Type II No date: GERD (gastroesophageal reflux disease) No date: Hyperlipidemia No date: Hypertension     Comment:  neg stress test 10 yrs ago - Dr Humphrey Rolls No date: PVD (peripheral vascular disease) ([place]) No date: Sleep apnea     Comment:  has BiPAP -doesn't use No date: Stroke [facility])     Comment:   no residual effects No date: Varicose vein     Comment:  treated by Dr. Delana Meyer   Reproductive/Obstetrics                             Lab Results  Component Value Date   WBC 8.2 12/16/2019   HGB 14.8 12/16/2019   HCT  41.4 12/16/2019   MCV 84.3 12/16/2019   PLT 203 12/16/2019    Anesthesia Physical Anesthesia Plan  ASA: III  Anesthesia Plan: Spinal   Post-op Pain Management:    Induction:   PONV Risk Score and Plan: 1 and Propofol infusion  Airway Management Planned: Natural Airway  Additional Equipment:   Intra-op Plan:   Post-operative Plan:   Informed Consent: I have reviewed the patients History and Physical, chart, labs and discussed the procedure including the risks, benefits and alternatives for the proposed anesthesia with the patient or authorized representative who has indicated his/her understanding and acceptance.     Dental advisory given  Plan Discussed with: CRNA and Anesthesiologist  Anesthesia Plan Comments:         Anesthesia Quick Evaluation

## 2019-12-24 DIAGNOSIS — I10 Essential (primary) hypertension: Secondary | ICD-10-CM | POA: Diagnosis not present

## 2019-12-24 DIAGNOSIS — Z96651 Presence of right artificial knee joint: Secondary | ICD-10-CM | POA: Diagnosis not present

## 2019-12-24 DIAGNOSIS — E782 Mixed hyperlipidemia: Secondary | ICD-10-CM | POA: Diagnosis not present

## 2019-12-24 DIAGNOSIS — Z471 Aftercare following joint replacement surgery: Secondary | ICD-10-CM | POA: Diagnosis not present

## 2019-12-24 DIAGNOSIS — G4730 Sleep apnea, unspecified: Secondary | ICD-10-CM | POA: Diagnosis not present

## 2019-12-24 DIAGNOSIS — Z7901 Long term (current) use of anticoagulants: Secondary | ICD-10-CM | POA: Diagnosis not present

## 2019-12-24 DIAGNOSIS — F32A Depression, unspecified: Secondary | ICD-10-CM | POA: Diagnosis not present

## 2019-12-24 DIAGNOSIS — Z7984 Long term (current) use of oral hypoglycemic drugs: Secondary | ICD-10-CM | POA: Diagnosis not present

## 2019-12-24 DIAGNOSIS — K219 Gastro-esophageal reflux disease without esophagitis: Secondary | ICD-10-CM | POA: Diagnosis not present

## 2019-12-24 DIAGNOSIS — E119 Type 2 diabetes mellitus without complications: Secondary | ICD-10-CM | POA: Diagnosis not present

## 2019-12-26 DIAGNOSIS — E119 Type 2 diabetes mellitus without complications: Secondary | ICD-10-CM | POA: Diagnosis not present

## 2019-12-26 DIAGNOSIS — Z7901 Long term (current) use of anticoagulants: Secondary | ICD-10-CM | POA: Diagnosis not present

## 2019-12-26 DIAGNOSIS — E782 Mixed hyperlipidemia: Secondary | ICD-10-CM | POA: Diagnosis not present

## 2019-12-26 DIAGNOSIS — G4730 Sleep apnea, unspecified: Secondary | ICD-10-CM | POA: Diagnosis not present

## 2019-12-26 DIAGNOSIS — K219 Gastro-esophageal reflux disease without esophagitis: Secondary | ICD-10-CM | POA: Diagnosis not present

## 2019-12-26 DIAGNOSIS — I10 Essential (primary) hypertension: Secondary | ICD-10-CM | POA: Diagnosis not present

## 2019-12-26 DIAGNOSIS — Z7984 Long term (current) use of oral hypoglycemic drugs: Secondary | ICD-10-CM | POA: Diagnosis not present

## 2019-12-26 DIAGNOSIS — Z96651 Presence of right artificial knee joint: Secondary | ICD-10-CM | POA: Diagnosis not present

## 2019-12-26 DIAGNOSIS — Z471 Aftercare following joint replacement surgery: Secondary | ICD-10-CM | POA: Diagnosis not present

## 2019-12-26 DIAGNOSIS — F32A Depression, unspecified: Secondary | ICD-10-CM | POA: Diagnosis not present

## 2019-12-29 DIAGNOSIS — Z7984 Long term (current) use of oral hypoglycemic drugs: Secondary | ICD-10-CM | POA: Diagnosis not present

## 2019-12-29 DIAGNOSIS — Z7901 Long term (current) use of anticoagulants: Secondary | ICD-10-CM | POA: Diagnosis not present

## 2019-12-29 DIAGNOSIS — Z96651 Presence of right artificial knee joint: Secondary | ICD-10-CM | POA: Diagnosis not present

## 2019-12-29 DIAGNOSIS — E119 Type 2 diabetes mellitus without complications: Secondary | ICD-10-CM | POA: Diagnosis not present

## 2019-12-29 DIAGNOSIS — Z471 Aftercare following joint replacement surgery: Secondary | ICD-10-CM | POA: Diagnosis not present

## 2019-12-29 DIAGNOSIS — K219 Gastro-esophageal reflux disease without esophagitis: Secondary | ICD-10-CM | POA: Diagnosis not present

## 2019-12-29 DIAGNOSIS — I10 Essential (primary) hypertension: Secondary | ICD-10-CM | POA: Diagnosis not present

## 2019-12-29 DIAGNOSIS — G4730 Sleep apnea, unspecified: Secondary | ICD-10-CM | POA: Diagnosis not present

## 2019-12-29 DIAGNOSIS — E782 Mixed hyperlipidemia: Secondary | ICD-10-CM | POA: Diagnosis not present

## 2019-12-29 DIAGNOSIS — F32A Depression, unspecified: Secondary | ICD-10-CM | POA: Diagnosis not present

## 2019-12-31 DIAGNOSIS — Z7984 Long term (current) use of oral hypoglycemic drugs: Secondary | ICD-10-CM | POA: Diagnosis not present

## 2019-12-31 DIAGNOSIS — I10 Essential (primary) hypertension: Secondary | ICD-10-CM | POA: Diagnosis not present

## 2019-12-31 DIAGNOSIS — Z7901 Long term (current) use of anticoagulants: Secondary | ICD-10-CM | POA: Diagnosis not present

## 2019-12-31 DIAGNOSIS — E782 Mixed hyperlipidemia: Secondary | ICD-10-CM | POA: Diagnosis not present

## 2019-12-31 DIAGNOSIS — Z96651 Presence of right artificial knee joint: Secondary | ICD-10-CM | POA: Diagnosis not present

## 2019-12-31 DIAGNOSIS — F32A Depression, unspecified: Secondary | ICD-10-CM | POA: Diagnosis not present

## 2019-12-31 DIAGNOSIS — E119 Type 2 diabetes mellitus without complications: Secondary | ICD-10-CM | POA: Diagnosis not present

## 2019-12-31 DIAGNOSIS — Z471 Aftercare following joint replacement surgery: Secondary | ICD-10-CM | POA: Diagnosis not present

## 2019-12-31 DIAGNOSIS — G4730 Sleep apnea, unspecified: Secondary | ICD-10-CM | POA: Diagnosis not present

## 2019-12-31 DIAGNOSIS — K219 Gastro-esophageal reflux disease without esophagitis: Secondary | ICD-10-CM | POA: Diagnosis not present

## 2020-01-02 DIAGNOSIS — F32A Depression, unspecified: Secondary | ICD-10-CM | POA: Diagnosis not present

## 2020-01-02 DIAGNOSIS — Z96651 Presence of right artificial knee joint: Secondary | ICD-10-CM | POA: Diagnosis not present

## 2020-01-02 DIAGNOSIS — I10 Essential (primary) hypertension: Secondary | ICD-10-CM | POA: Diagnosis not present

## 2020-01-02 DIAGNOSIS — E782 Mixed hyperlipidemia: Secondary | ICD-10-CM | POA: Diagnosis not present

## 2020-01-02 DIAGNOSIS — E119 Type 2 diabetes mellitus without complications: Secondary | ICD-10-CM | POA: Diagnosis not present

## 2020-01-02 DIAGNOSIS — Z7901 Long term (current) use of anticoagulants: Secondary | ICD-10-CM | POA: Diagnosis not present

## 2020-01-02 DIAGNOSIS — G4730 Sleep apnea, unspecified: Secondary | ICD-10-CM | POA: Diagnosis not present

## 2020-01-02 DIAGNOSIS — K219 Gastro-esophageal reflux disease without esophagitis: Secondary | ICD-10-CM | POA: Diagnosis not present

## 2020-01-02 DIAGNOSIS — Z471 Aftercare following joint replacement surgery: Secondary | ICD-10-CM | POA: Diagnosis not present

## 2020-01-02 DIAGNOSIS — Z7984 Long term (current) use of oral hypoglycemic drugs: Secondary | ICD-10-CM | POA: Diagnosis not present

## 2020-01-05 DIAGNOSIS — Z471 Aftercare following joint replacement surgery: Secondary | ICD-10-CM | POA: Diagnosis not present

## 2020-01-05 DIAGNOSIS — K219 Gastro-esophageal reflux disease without esophagitis: Secondary | ICD-10-CM | POA: Diagnosis not present

## 2020-01-05 DIAGNOSIS — G4730 Sleep apnea, unspecified: Secondary | ICD-10-CM | POA: Diagnosis not present

## 2020-01-05 DIAGNOSIS — I10 Essential (primary) hypertension: Secondary | ICD-10-CM | POA: Diagnosis not present

## 2020-01-05 DIAGNOSIS — E119 Type 2 diabetes mellitus without complications: Secondary | ICD-10-CM | POA: Diagnosis not present

## 2020-01-05 DIAGNOSIS — F32A Depression, unspecified: Secondary | ICD-10-CM | POA: Diagnosis not present

## 2020-01-05 DIAGNOSIS — E782 Mixed hyperlipidemia: Secondary | ICD-10-CM | POA: Diagnosis not present

## 2020-01-05 DIAGNOSIS — Z7984 Long term (current) use of oral hypoglycemic drugs: Secondary | ICD-10-CM | POA: Diagnosis not present

## 2020-01-05 DIAGNOSIS — Z96651 Presence of right artificial knee joint: Secondary | ICD-10-CM | POA: Diagnosis not present

## 2020-01-05 DIAGNOSIS — Z7901 Long term (current) use of anticoagulants: Secondary | ICD-10-CM | POA: Diagnosis not present

## 2020-01-06 DIAGNOSIS — Z96651 Presence of right artificial knee joint: Secondary | ICD-10-CM | POA: Diagnosis not present

## 2020-01-06 DIAGNOSIS — M25661 Stiffness of right knee, not elsewhere classified: Secondary | ICD-10-CM | POA: Diagnosis not present

## 2020-01-06 DIAGNOSIS — M6281 Muscle weakness (generalized): Secondary | ICD-10-CM | POA: Diagnosis not present

## 2020-01-06 DIAGNOSIS — M25561 Pain in right knee: Secondary | ICD-10-CM | POA: Diagnosis not present

## 2020-01-09 DIAGNOSIS — M6281 Muscle weakness (generalized): Secondary | ICD-10-CM | POA: Diagnosis not present

## 2020-01-09 DIAGNOSIS — M25561 Pain in right knee: Secondary | ICD-10-CM | POA: Diagnosis not present

## 2020-01-09 DIAGNOSIS — Z96651 Presence of right artificial knee joint: Secondary | ICD-10-CM | POA: Diagnosis not present

## 2020-01-09 DIAGNOSIS — M25661 Stiffness of right knee, not elsewhere classified: Secondary | ICD-10-CM | POA: Diagnosis not present

## 2020-01-11 ENCOUNTER — Other Ambulatory Visit: Payer: Self-pay | Admitting: Family Medicine

## 2020-01-11 DIAGNOSIS — I10 Essential (primary) hypertension: Secondary | ICD-10-CM

## 2020-01-12 DIAGNOSIS — M25561 Pain in right knee: Secondary | ICD-10-CM | POA: Diagnosis not present

## 2020-01-12 DIAGNOSIS — M6281 Muscle weakness (generalized): Secondary | ICD-10-CM | POA: Diagnosis not present

## 2020-01-12 DIAGNOSIS — M25661 Stiffness of right knee, not elsewhere classified: Secondary | ICD-10-CM | POA: Diagnosis not present

## 2020-01-12 DIAGNOSIS — Z96651 Presence of right artificial knee joint: Secondary | ICD-10-CM | POA: Diagnosis not present

## 2020-01-14 DIAGNOSIS — M25661 Stiffness of right knee, not elsewhere classified: Secondary | ICD-10-CM | POA: Diagnosis not present

## 2020-01-14 DIAGNOSIS — M25561 Pain in right knee: Secondary | ICD-10-CM | POA: Diagnosis not present

## 2020-01-14 DIAGNOSIS — M6281 Muscle weakness (generalized): Secondary | ICD-10-CM | POA: Diagnosis not present

## 2020-01-19 DIAGNOSIS — Z96651 Presence of right artificial knee joint: Secondary | ICD-10-CM | POA: Diagnosis not present

## 2020-01-19 DIAGNOSIS — M6281 Muscle weakness (generalized): Secondary | ICD-10-CM | POA: Diagnosis not present

## 2020-01-19 DIAGNOSIS — M25661 Stiffness of right knee, not elsewhere classified: Secondary | ICD-10-CM | POA: Diagnosis not present

## 2020-01-19 DIAGNOSIS — M25561 Pain in right knee: Secondary | ICD-10-CM | POA: Diagnosis not present

## 2020-01-21 DIAGNOSIS — E119 Type 2 diabetes mellitus without complications: Secondary | ICD-10-CM | POA: Diagnosis not present

## 2020-01-23 DIAGNOSIS — M6281 Muscle weakness (generalized): Secondary | ICD-10-CM | POA: Diagnosis not present

## 2020-01-23 DIAGNOSIS — M25661 Stiffness of right knee, not elsewhere classified: Secondary | ICD-10-CM | POA: Diagnosis not present

## 2020-01-23 DIAGNOSIS — M25561 Pain in right knee: Secondary | ICD-10-CM | POA: Diagnosis not present

## 2020-01-23 DIAGNOSIS — Z96651 Presence of right artificial knee joint: Secondary | ICD-10-CM | POA: Diagnosis not present

## 2020-01-28 DIAGNOSIS — M25561 Pain in right knee: Secondary | ICD-10-CM | POA: Diagnosis not present

## 2020-01-30 DIAGNOSIS — M25561 Pain in right knee: Secondary | ICD-10-CM | POA: Diagnosis not present

## 2020-02-02 DIAGNOSIS — M25661 Stiffness of right knee, not elsewhere classified: Secondary | ICD-10-CM | POA: Diagnosis not present

## 2020-02-02 DIAGNOSIS — Z96651 Presence of right artificial knee joint: Secondary | ICD-10-CM | POA: Diagnosis not present

## 2020-02-02 DIAGNOSIS — M25561 Pain in right knee: Secondary | ICD-10-CM | POA: Diagnosis not present

## 2020-02-02 DIAGNOSIS — M6281 Muscle weakness (generalized): Secondary | ICD-10-CM | POA: Diagnosis not present

## 2020-02-05 DIAGNOSIS — Z96651 Presence of right artificial knee joint: Secondary | ICD-10-CM | POA: Diagnosis not present

## 2020-02-05 DIAGNOSIS — M6281 Muscle weakness (generalized): Secondary | ICD-10-CM | POA: Diagnosis not present

## 2020-02-05 DIAGNOSIS — M25561 Pain in right knee: Secondary | ICD-10-CM | POA: Diagnosis not present

## 2020-02-05 DIAGNOSIS — M25661 Stiffness of right knee, not elsewhere classified: Secondary | ICD-10-CM | POA: Diagnosis not present

## 2020-02-09 DIAGNOSIS — Z96651 Presence of right artificial knee joint: Secondary | ICD-10-CM | POA: Diagnosis not present

## 2020-02-09 DIAGNOSIS — I10 Essential (primary) hypertension: Secondary | ICD-10-CM | POA: Diagnosis not present

## 2020-02-09 DIAGNOSIS — G4733 Obstructive sleep apnea (adult) (pediatric): Secondary | ICD-10-CM | POA: Diagnosis not present

## 2020-02-09 DIAGNOSIS — E782 Mixed hyperlipidemia: Secondary | ICD-10-CM | POA: Diagnosis not present

## 2020-02-09 DIAGNOSIS — I6523 Occlusion and stenosis of bilateral carotid arteries: Secondary | ICD-10-CM | POA: Diagnosis not present

## 2020-02-18 DIAGNOSIS — Z96651 Presence of right artificial knee joint: Secondary | ICD-10-CM | POA: Diagnosis not present

## 2020-02-18 DIAGNOSIS — M25561 Pain in right knee: Secondary | ICD-10-CM | POA: Diagnosis not present

## 2020-02-18 DIAGNOSIS — M6281 Muscle weakness (generalized): Secondary | ICD-10-CM | POA: Diagnosis not present

## 2020-02-18 DIAGNOSIS — M25661 Stiffness of right knee, not elsewhere classified: Secondary | ICD-10-CM | POA: Diagnosis not present

## 2020-02-21 DIAGNOSIS — E119 Type 2 diabetes mellitus without complications: Secondary | ICD-10-CM | POA: Diagnosis not present

## 2020-03-15 ENCOUNTER — Other Ambulatory Visit: Payer: Self-pay | Admitting: Family Medicine

## 2020-03-15 DIAGNOSIS — E119 Type 2 diabetes mellitus without complications: Secondary | ICD-10-CM

## 2020-03-23 DIAGNOSIS — E119 Type 2 diabetes mellitus without complications: Secondary | ICD-10-CM | POA: Diagnosis not present

## 2020-04-20 DIAGNOSIS — E119 Type 2 diabetes mellitus without complications: Secondary | ICD-10-CM | POA: Diagnosis not present

## 2020-04-30 DIAGNOSIS — H43811 Vitreous degeneration, right eye: Secondary | ICD-10-CM | POA: Diagnosis not present

## 2020-05-20 DIAGNOSIS — G5601 Carpal tunnel syndrome, right upper limb: Secondary | ICD-10-CM | POA: Diagnosis not present

## 2020-05-20 DIAGNOSIS — G5602 Carpal tunnel syndrome, left upper limb: Secondary | ICD-10-CM | POA: Diagnosis not present

## 2020-05-20 DIAGNOSIS — M792 Neuralgia and neuritis, unspecified: Secondary | ICD-10-CM | POA: Diagnosis not present

## 2020-05-21 DIAGNOSIS — E119 Type 2 diabetes mellitus without complications: Secondary | ICD-10-CM | POA: Diagnosis not present

## 2020-06-12 ENCOUNTER — Other Ambulatory Visit: Payer: Self-pay | Admitting: Family Medicine

## 2020-06-12 DIAGNOSIS — E119 Type 2 diabetes mellitus without complications: Secondary | ICD-10-CM

## 2020-06-12 NOTE — Telephone Encounter (Signed)
Requested Prescriptions  Pending Prescriptions Disp Refills  . metFORMIN (GLUCOPHAGE) 500 MG tablet [Pharmacy Med Name: METFORMIN HCL 500 MG TABLET] 60 tablet 0    Sig: TAKE 1 TABLET BY MOUTH 2 TIMES DAILY WITH A MEAL.     Endocrinology:  Diabetes - Biguanides Failed - 06/12/2020 12:32 PM      Failed - eGFR in normal range and within 360 days    GFR calc Af Amer  Date Value Ref Range Status  04/30/2019 106 >59 mL/min/1.73 Final   GFR, Estimated  Date Value Ref Range Status  12/16/2019 >60 >60 mL/min Final    Comment:    (NOTE) Calculated using the CKD-EPI Creatinine Equation (2021)          Passed - Cr in normal range and within 360 days    Creatinine, Ser  Date Value Ref Range Status  12/16/2019 1.13 0.61 - 1.24 mg/dL Final         Passed - HBA1C is between 0 and 7.9 and within 180 days    Hgb A1c MFr Bld  Date Value Ref Range Status  12/16/2019 6.9 (H) 4.8 - 5.6 % Final    Comment:    (NOTE)         Prediabetes: 5.7 - 6.4         Diabetes: >6.4         Glycemic control for adults with diabetes: <7.0          Passed - Valid encounter within last 6 months    Recent Outpatient Visits          5 months ago Type 2 diabetes mellitus without complication, without long-term current use of insulin ([place])   [facility] Juline Patch, [facility]   1 year ago Taking medication for chronic disease   [facility] Juline Patch, MD   1 year ago Type 2 diabetes mellitus without complication, without long-term current use of insulin ([place])   [facility] Juline Patch, MD   2 years ago Type 2 diabetes mellitus without complication, without long-term current use of insulin ([place])   [facility] Juline Patch, MD   2 years ago History of posttraumatic stress disorder (PTSD)   [facility] Juline Patch, MD

## 2020-06-16 ENCOUNTER — Other Ambulatory Visit: Payer: Self-pay | Admitting: Family Medicine

## 2020-06-16 DIAGNOSIS — F419 Anxiety disorder, unspecified: Secondary | ICD-10-CM

## 2020-06-16 DIAGNOSIS — Z8659 Personal history of other mental and behavioral disorders: Secondary | ICD-10-CM

## 2020-06-16 DIAGNOSIS — E119 Type 2 diabetes mellitus without complications: Secondary | ICD-10-CM

## 2020-06-16 DIAGNOSIS — F3341 Major depressive disorder, recurrent, in partial remission: Secondary | ICD-10-CM

## 2020-06-20 DIAGNOSIS — E119 Type 2 diabetes mellitus without complications: Secondary | ICD-10-CM | POA: Diagnosis not present

## 2020-07-06 ENCOUNTER — Other Ambulatory Visit: Payer: Self-pay | Admitting: Family Medicine

## 2020-07-06 DIAGNOSIS — F419 Anxiety disorder, unspecified: Secondary | ICD-10-CM

## 2020-07-06 DIAGNOSIS — E119 Type 2 diabetes mellitus without complications: Secondary | ICD-10-CM

## 2020-07-06 DIAGNOSIS — Z8659 Personal history of other mental and behavioral disorders: Secondary | ICD-10-CM

## 2020-07-06 DIAGNOSIS — F3341 Major depressive disorder, recurrent, in partial remission: Secondary | ICD-10-CM

## 2020-07-06 DIAGNOSIS — I10 Essential (primary) hypertension: Secondary | ICD-10-CM

## 2020-07-06 NOTE — Telephone Encounter (Signed)
Requested medication (s) are due for refill today: no  Requested medication (s) are on the active medication list:yes  Last refill: 06/12/2020  Future visit scheduled:no  Notes to clinic: overdue for 6 month follow up  Message has been sent for patient to contact office    Requested Prescriptions  Pending Prescriptions Disp Refills   sertraline (ZOLOFT) 50 MG tablet [Pharmacy Med Name: SERTRALINE HCL 50 MG TABLET] 90 tablet 1    Sig: TAKE 1 [place]      Psychiatry:  Antidepressants - SSRI Failed - 07/06/2020  8:22 AM      Failed - Valid encounter within last 6 months    Recent Outpatient Visits           6 months ago Type 2 diabetes mellitus without complication, without long-term current use of insulin ([place])   [facility] Juline Patch, MD   1 year ago Taking medication for chronic disease   [facility] Juline Patch, MD   1 year ago Type 2 diabetes mellitus without complication, without long-term current use of insulin ([place])   [facility] Juline Patch, MD   2 years ago Type 2 diabetes mellitus without complication, without long-term current use of insulin ([place])   [facility] Juline Patch, MD   2 years ago History of posttraumatic stress disorder (PTSD)   [facility] Juline Patch, MD                Passed - Completed PHQ-2 or PHQ-9 in the last 360 days        glipiZIDE (GLUCOTROL) 5 MG tablet [Pharmacy Med Name: GLIPIZIDE 5 MG TABLET] 180 tablet 1    Sig: TAKE 1 TABLET BY MOUTH 2 TIMES DAILY BEFORE A MEAL      Endocrinology:  Diabetes - Sulfonylureas Failed - 07/06/2020  8:22 AM      Failed - HBA1C is between 0 and 7.9 and within 180 days    Hgb A1c MFr Bld  Date Value Ref Range Status  12/16/2019 6.9 (H) 4.8 - 5.6 % Final    Comment:    (NOTE)         Prediabetes: 5.7 - 6.4         Diabetes: >6.4         Glycemic control for adults with diabetes: <7.0           Failed - Valid  encounter within last 6 months    Recent Outpatient Visits           6 months ago Type 2 diabetes mellitus without complication, without long-term current use of insulin ([place])   [facility] Juline Patch, MD   1 year ago Taking medication for chronic disease   [facility] Juline Patch, MD   1 year ago Type 2 diabetes mellitus without complication, without long-term current use of insulin ([place])   [facility] Juline Patch, MD   2 years ago Type 2 diabetes mellitus without complication, without long-term current use of insulin ([place])   [facility] Juline Patch, MD   2 years ago History of posttraumatic stress disorder (PTSD)   [facility] Juline Patch, MD                  triamterene-hydrochlorothiazide (DYAZIDE) 37.5-25 MG capsule [Pharmacy Med Name: TRIAMTERENE-HCTZ 37.5-25 MG CP] 90 capsule 1  Sig: TAKE 1 CAPSULE BY MOUTH EVERY DAY      Cardiovascular: Diuretic Combos Failed - 07/06/2020  8:22 AM      Failed - Valid encounter within last 6 months    Recent Outpatient Visits           6 months ago Type 2 diabetes mellitus without complication, without long-term current use of insulin ([place])   [facility] Juline Patch, MD   1 year ago Taking medication for chronic disease   [facility] Juline Patch, MD   1 year ago Type 2 diabetes mellitus without complication, without long-term current use of insulin ([place])   [facility] Juline Patch, MD   2 years ago Type 2 diabetes mellitus without complication, without long-term current use of insulin ([place])   [facility] Juline Patch, MD   2 years ago History of posttraumatic stress disorder (PTSD)   [facility] Juline Patch, MD                Passed - K in normal range and within 360 days    Potassium  Date Value Ref Range Status  12/16/2019 3.5 3.5 - 5.1 mmol/L Final          Passed - Na in normal  range and within 360 days    Sodium  Date Value Ref Range Status  12/16/2019 135 135 - 145 mmol/L Final  04/30/2019 138 134 - 144 mmol/L Final          Passed - Cr in normal range and within 360 days    Creatinine, Ser  Date Value Ref Range Status  12/16/2019 1.13 0.61 - 1.24 mg/dL Final          Passed - Ca in normal range and within 360 days    Calcium  Date Value Ref Range Status  12/16/2019 9.4 8.9 - 10.3 mg/dL Final          Passed - Last BP in normal range    BP Readings from Last 1 Encounters:  12/23/19 138/69            metFORMIN (GLUCOPHAGE) 500 MG tablet [Pharmacy Med Name: METFORMIN HCL 500 MG TABLET] 60 tablet 0    Sig: TAKE 1 TABLET BY MOUTH 2 TIMES DAILY WITH A MEAL.      Endocrinology:  Diabetes - Biguanides Failed - 07/06/2020  8:22 AM      Failed - HBA1C is between 0 and 7.9 and within 180 days    Hgb A1c MFr Bld  Date Value Ref Range Status  12/16/2019 6.9 (H) 4.8 - 5.6 % Final    Comment:    (NOTE)         Prediabetes: 5.7 - 6.4         Diabetes: >6.4         Glycemic control for adults with diabetes: <7.0           Failed - Valid encounter within last 6 months    Recent Outpatient Visits           6 months ago Type 2 diabetes mellitus without complication, without long-term current use of insulin ([place])   [facility] Juline Patch, MD   1 year ago Taking medication for chronic disease   [facility] Juline Patch, MD   1 year ago Type 2 diabetes mellitus without complication, without long-term current use of insulin ([place])  [facility] Juline Patch, MD   2 years ago Type 2 diabetes mellitus without complication, without long-term current use of insulin ([place])   [facility] Juline Patch, MD   2 years ago History of posttraumatic stress disorder (PTSD)   [facility] Juline Patch, MD                Passed - Cr in normal range and within 360 days    Creatinine, Ser  Date  Value Ref Range Status  12/16/2019 1.13 0.61 - 1.24 mg/dL Final          Passed - eGFR in normal range and within 360 days    GFR calc Af Amer  Date Value Ref Range Status  04/30/2019 106 >59 mL/min/1.73 Final   GFR, Estimated  Date Value Ref Range Status  12/16/2019 >60 >60 mL/min Final    Comment:    (NOTE) Calculated using the CKD-EPI Creatinine Equation (2021)             pantoprazole (PROTONIX) 40 MG tablet [Pharmacy Med Name: PANTOPRAZOLE SOD DR 40 MG TAB] 90 tablet 1    Sig: TAKE 1 TABLET BY MOUTH EVERY DAY      Gastroenterology: Proton Pump Inhibitors Passed - 07/06/2020  8:22 AM      Passed - Valid encounter within last 12 months    Recent Outpatient Visits           6 months ago Type 2 diabetes mellitus without complication, without long-term current use of insulin ([place])   [facility] Juline Patch, MD   1 year ago Taking medication for chronic disease   [facility] Juline Patch, MD   1 year ago Type 2 diabetes mellitus without complication, without long-term current use of insulin ([place])   [facility] Juline Patch, MD   2 years ago Type 2 diabetes mellitus without complication, without long-term current use of insulin ([place])   [facility] Juline Patch, MD   2 years ago History of posttraumatic stress disorder (PTSD)   [facility] Juline Patch, MD                  amLODipine (NORVASC) 10 MG tablet [Pharmacy Med Name: AMLODIPINE BESYLATE 10 MG TAB] 90 tablet 1    Sig: TAKE 1 TABLET BY MOUTH EVERY DAY      Cardiovascular:  Calcium Channel Blockers Failed - 07/06/2020  8:22 AM      Failed - Valid encounter within last 6 months    Recent Outpatient Visits           6 months ago Type 2 diabetes mellitus without complication, without long-term current use of insulin ([place])   [facility] Juline Patch, MD   1 year ago Taking medication for chronic disease   [facility]  Juline Patch, MD   1 year ago Type 2 diabetes mellitus without complication, without long-term current use of insulin ([place])   [facility] Juline Patch, MD   2 years ago Type 2 diabetes mellitus without complication, without long-term current use of insulin ([place])   [facility] Juline Patch, MD   2 years ago History of posttraumatic stress disorder (PTSD)   [facility] Juline Patch, MD                Passed - Last BP in normal range  BP Readings from Last 1 Encounters:  12/23/19 138/69            buPROPion (WELLBUTRIN) 75 MG tablet [Pharmacy Med Name: BUPROPION HCL 75 MG TABLET] 180 tablet 1    Sig: TAKE 1 TABLET BY MOUTH TWICE A DAY      Psychiatry: Antidepressants - bupropion Failed - 07/06/2020  8:22 AM      Failed - Valid encounter within last 6 months    Recent Outpatient Visits           6 months ago Type 2 diabetes mellitus without complication, without long-term current use of insulin ([place])   [facility] Juline Patch, MD   1 year ago Taking medication for chronic disease   [facility] Juline Patch, MD   1 year ago Type 2 diabetes mellitus without complication, without long-term current use of insulin ([place])   [facility] Juline Patch, MD   2 years ago Type 2 diabetes mellitus without complication, without long-term current use of insulin ([place])   [facility] Juline Patch, MD   2 years ago History of posttraumatic stress disorder (PTSD)   [facility] Juline Patch, MD                Passed - Completed PHQ-2 or PHQ-9 in the last 360 days      Passed - Last BP in normal range    BP Readings from Last 1 Encounters:  12/23/19 138/69

## 2020-07-12 ENCOUNTER — Other Ambulatory Visit: Payer: Self-pay

## 2020-07-12 ENCOUNTER — Ambulatory Visit (INDEPENDENT_AMBULATORY_CARE_PROVIDER_SITE_OTHER): Payer: BC Managed Care – PPO | Admitting: Family Medicine

## 2020-07-12 ENCOUNTER — Encounter: Payer: Self-pay | Admitting: *Deleted

## 2020-07-12 ENCOUNTER — Encounter: Payer: Self-pay | Admitting: Family Medicine

## 2020-07-12 VITALS — BP 130/70 | HR 64 | Ht 73.0 in | Wt 255.0 lb

## 2020-07-12 DIAGNOSIS — I10 Essential (primary) hypertension: Secondary | ICD-10-CM

## 2020-07-12 DIAGNOSIS — E7801 Familial hypercholesterolemia: Secondary | ICD-10-CM | POA: Diagnosis not present

## 2020-07-12 DIAGNOSIS — F3341 Major depressive disorder, recurrent, in partial remission: Secondary | ICD-10-CM | POA: Diagnosis not present

## 2020-07-12 DIAGNOSIS — F419 Anxiety disorder, unspecified: Secondary | ICD-10-CM

## 2020-07-12 DIAGNOSIS — R234 Changes in skin texture: Secondary | ICD-10-CM

## 2020-07-12 DIAGNOSIS — E119 Type 2 diabetes mellitus without complications: Secondary | ICD-10-CM

## 2020-07-12 DIAGNOSIS — K219 Gastro-esophageal reflux disease without esophagitis: Secondary | ICD-10-CM

## 2020-07-12 DIAGNOSIS — Z8659 Personal history of other mental and behavioral disorders: Secondary | ICD-10-CM

## 2020-07-12 MED ORDER — LOSARTAN POTASSIUM 100 MG PO TABS: 50.0000 mg | ORAL_TABLET | Freq: Every day | ORAL | 1 refills | Status: DC

## 2020-07-12 MED ORDER — PANTOPRAZOLE SODIUM 40 MG PO TBEC: 1.0000 | DELAYED_RELEASE_TABLET | Freq: Every day | ORAL | 1 refills | Status: DC

## 2020-07-12 MED ORDER — ROSUVASTATIN CALCIUM 10 MG PO TABS: 10.0000 mg | ORAL_TABLET | Freq: Every day | ORAL | 1 refills | Status: DC

## 2020-07-12 MED ORDER — SERTRALINE HCL 50 MG PO TABS: 1.0000 | ORAL_TABLET | Freq: Every day | ORAL | 1 refills | Status: DC

## 2020-07-12 MED ORDER — BUPROPION HCL 75 MG PO TABS: 75.0000 mg | ORAL_TABLET | Freq: Two times a day (BID) | ORAL | 1 refills | Status: DC

## 2020-07-12 MED ORDER — TRIAMTERENE-HCTZ 37.5-25 MG PO CAPS: 1.0000 | ORAL_CAPSULE | Freq: Every day | ORAL | 1 refills | Status: DC

## 2020-07-12 MED ORDER — AMLODIPINE BESYLATE 10 MG PO TABS: 1.0000 | ORAL_TABLET | Freq: Every day | ORAL | 1 refills | Status: DC

## 2020-07-12 NOTE — Progress Notes (Signed)
Date:  07/12/2020   Name:  Raymond [name]   DOB:  [birth year]   MRN:  [id number]   Chief Complaint: Hyperlipidemia, Hypertension, Diabetes, and Depression  Hyperlipidemia This is a chronic problem. The current episode started more than 1 year ago. The problem is controlled. Recent lipid tests were reviewed and are normal. Exacerbating diseases include obesity. He has no history of chronic renal disease, diabetes, hypothyroidism or liver disease. Pertinent negatives include no chest pain, myalgias or shortness of breath. Current antihyperlipidemic treatment includes statins. The current treatment provides moderate improvement of lipids. There are no compliance problems.  Risk factors for coronary artery disease include diabetes mellitus, dyslipidemia and hypertension.  Hypertension This is a chronic problem. The current episode started more than 1 year ago. The problem has been waxing and waning since onset. The problem is controlled. Pertinent negatives include no anxiety, blurred vision, chest pain, headaches, malaise/fatigue, neck pain, orthopnea, palpitations, peripheral edema, PND or shortness of breath. Risk factors for coronary artery disease include dyslipidemia. Past treatments include angiotensin blockers, calcium channel blockers and diuretics. The current treatment provides moderate improvement. There are no compliance problems.  There is no history of angina, kidney disease, CAD/MI, CVA, heart failure, left ventricular hypertrophy, PVD or retinopathy. There is no history of chronic renal disease, a hypertension causing med or renovascular disease.  Diabetes He presents for his follow-up diabetic visit. He has gestational diabetes mellitus. His disease course has been stable. There are no hypoglycemic associated symptoms. Pertinent negatives for hypoglycemia include no dizziness, headaches or nervousness/anxiousness. Pertinent negatives for diabetes include no blurred vision, no chest pain,  no fatigue, no foot paresthesias, no foot ulcerations, no polydipsia, no polyphagia, no polyuria, no visual change, no weakness and no weight loss. There are no hypoglycemic complications. Symptoms are stable. Pertinent negatives for diabetic complications include no CVA, PVD or retinopathy. His weight is stable. He is following a generally healthy diet. Meal planning includes avoidance of concentrated sweets and carbohydrate counting. His breakfast blood glucose range is generally 140-180 mg/dl. An ACE inhibitor/angiotensin II receptor blocker is being taken.  Depression        This is a chronic problem.  The current episode started more than 1 year ago.   The onset quality is gradual.   The problem has been gradually improving since onset.  Associated symptoms include no decreased concentration, no fatigue, no helplessness, no hopelessness, does not have insomnia, not irritable, no restlessness, no decreased interest, no appetite change, no body aches, no myalgias, no headaches, no indigestion, not sad and no suicidal ideas.  Past treatments include SSRIs - Selective serotonin reuptake inhibitors.   Pertinent negatives include no hypothyroidism and no anxiety.   Lab Results  Component Value Date   CREATININE 1.13 12/16/2019   BUN 18 12/16/2019   NA 135 12/16/2019   K 3.5 12/16/2019   CL 99 12/16/2019   CO2 27 12/16/2019   Lab Results  Component Value Date   CHOL 175 12/22/2019   HDL 42 12/22/2019   LDLCALC 115 (H) 12/22/2019   TRIG 96 12/22/2019   CHOLHDL 3.3 07/24/2016   Lab Results  Component Value Date   TSH 2.620 11/07/2018   Lab Results  Component Value Date   HGBA1C 6.9 (H) 12/16/2019   Lab Results  Component Value Date   WBC 8.2 12/16/2019   HGB 14.8 12/16/2019   HCT 41.4 12/16/2019   MCV 84.3 12/16/2019   PLT 203 12/16/2019   Lab Results  Component Value Date   ALT 19 12/16/2019   AST 19 12/16/2019   ALKPHOS 60 12/16/2019   BILITOT 1.2 12/16/2019     Review of  Systems  Constitutional: Negative for appetite change, chills, fatigue, fever, malaise/fatigue and weight loss.  HENT: Negative for drooling, ear discharge, ear pain and sore throat.   Eyes: Negative for blurred vision.  Respiratory: Negative for cough, shortness of breath and wheezing.   Cardiovascular: Negative for chest pain, palpitations, orthopnea, leg swelling and PND.  Gastrointestinal: Negative for abdominal pain, blood in stool, constipation, diarrhea and nausea.  Endocrine: Negative for polydipsia, polyphagia and polyuria.  Genitourinary: Negative for dysuria, frequency, hematuria and urgency.  Musculoskeletal: Negative for back pain, myalgias and neck pain.  Skin: Negative for rash.  Allergic/Immunologic: Negative for environmental allergies.  Neurological: Negative for dizziness, weakness and headaches.  Hematological: Does not bruise/bleed easily.  Psychiatric/Behavioral: Positive for depression. Negative for decreased concentration and suicidal ideas. The patient is not nervous/anxious and does not have insomnia.     Patient Active Problem List   Diagnosis Date Noted  . Varicose veins of leg with pain, left 12/30/2018  . Carpal tunnel syndrome, right 08/09/2018  . Primary osteoarthritis of left shoulder 08/09/2018  . Rotator cuff tendinitis, left 08/09/2018  . Strain of left knee 08/06/2017  . Obesity (BMI 35.0-39.9 without comorbidity) 08/03/2017  . Status post arthroscopy of left knee 08/03/2017  . Complex tear of medial meniscus of left knee as current injury 07/26/2017  . Chest pain 07/24/2016  . Uncontrolled type 2 diabetes mellitus without complication, without long-term current use of insulin 07/24/2016  . Status post total hip replacement, left 01/25/2016  . Benign essential HTN 07/21/2015  . Depression 07/21/2015  . Hyperlipidemia, mixed 07/21/2015  . GERD (gastroesophageal reflux disease) 12/04/2014  . Biceps tendinitis 07/21/2014  . Impingement syndrome of  shoulder 07/21/2014  . Primary osteoarthritis of right knee 08/14/2013  . Neuritis or radiculitis due to rupture of lumbar intervertebral disc 08/14/2013    No Known Allergies  Past Surgical History:  Procedure Laterality Date  . CARPAL TUNNEL RELEASE Right 01/02/2019   Procedure: CARPAL TUNNEL RELEASE ENDOSCOPIC;  Surgeon: Corky Mull, MD;  Location: [facility];  Service: Orthopedics;  Laterality: Right;  . CHOLECYSTECTOMY    . CHONDROPLASTY  10/05/09   knee- shaving patellofemoral joint  . COLONOSCOPY  2009?  . ENDOVENOUS ABLATION SAPHENOUS VEIN W/ LASER    . INJECTION KNEE Left 12/23/2019   Procedure: KNEE INJECTION;  Surgeon: Corky Mull, MD;  Location: [facility];  Service: Orthopedics;  Laterality: Left;  . JOINT REPLACEMENT Left    hip  . KNEE ARTHROSCOPY Left 07/25/2017   Procedure: Arthroscopic partial medial meniscectomy; arthroscopic debridement; and arthroscopic abrasion chondroplasty of medial femoral condyle, lateral tibial plateau, and femoral trochlea; left knee.;  Surgeon: Corky Mull, MD;  Location: [place];  Service: Orthopedics;  Laterality: Left;  . KNEE ARTHROSCOPY WITH MEDIAL MENISECTOMY Right    Partial medial and lateral menisectomy  . SHOULDER ARTHROSCOPY Left 08/10/2014   Procedure: ARTHROSCOPY SHOULDER WITH LIMITED DEBRIDEMENT, REMOVAL OF LOOSE BODY AND RELEASE OF LONG END BICEPS TENDON;  Surgeon: Leanor Kail, MD;  Location: [place];  Service: Orthopedics;  Laterality: Left;  . TOTAL HIP ARTHROPLASTY Left 01/25/2016   Procedure: TOTAL HIP ARTHROPLASTY;  Surgeon: Corky Mull, MD;  Location: [facility];  Service: Orthopedics;  Laterality: Left;  . TOTAL KNEE ARTHROPLASTY Right 12/23/2019   Procedure: TOTAL KNEE ARTHROPLASTY;  Surgeon: Corky Mull, MD;  Location: [facility];  Service: Orthopedics;  Laterality: Right;    Social History   Tobacco Use  . Smoking status: Never Smoker  . Smokeless tobacco: Never Used  Vaping Use  .  Vaping Use: Never used  Substance Use Topics  . Alcohol use: Yes    Comment: rare  . Drug use: No     Medication list has been reviewed and updated.  Current Meds  Medication Sig  . ACCU-CHEK AVIVA PLUS test strip TEST AS DIRECTED  . amLODipine (NORVASC) 10 MG tablet TAKE 1 TABLET BY MOUTH EVERY DAY  . apixaban (ELIQUIS) 2.5 MG TABS tablet Take 1 tablet (2.5 mg total) by mouth 2 (two) times daily.  Marland Kitchen buPROPion (WELLBUTRIN) 75 MG tablet TAKE 1 TABLET BY MOUTH TWICE A DAY  . cyclobenzaprine (FLEXERIL) 10 MG tablet Take 10 mg by mouth 2 (two) times daily as needed for muscle spasms.  Marland Kitchen gabapentin (NEURONTIN) 300 MG capsule 3 capsules tid (Patient taking differently: Take 900 mg by mouth 3 (three) times daily.)  . glipiZIDE (GLUCOTROL) 5 MG tablet TAKE 1 TABLET BY MOUTH 2 TIMES DAILY BEFORE A MEAL  . losartan (COZAAR) 100 MG tablet Take 0.5 tablets (50 mg total) by mouth daily.  . metFORMIN (GLUCOPHAGE) 500 MG tablet TAKE 1 TABLET BY MOUTH 2 TIMES DAILY WITH A MEAL.  Marland Kitchen Omega-3 Fatty Acids (FISH OIL) 1200 MG CAPS Take 1,200 mg by mouth daily.  . pantoprazole (PROTONIX) 40 MG tablet TAKE 1 TABLET BY MOUTH EVERY DAY  . rosuvastatin (CRESTOR) 10 MG tablet Take 1 tablet (10 mg total) by mouth daily.  . sertraline (ZOLOFT) 50 MG tablet TAKE 1 TABLET BY MOUTH EVERY DAY  . tiZANidine (ZANAFLEX) 2 MG tablet Take 2 mg by mouth at bedtime as needed for muscle spasms.   Marland Kitchen triamterene-hydrochlorothiazide (DYAZIDE) 37.5-25 MG capsule TAKE 1 CAPSULE BY MOUTH EVERY DAY  . [DISCONTINUED] esomeprazole (NEXIUM) 20 MG capsule Take 20 mg by mouth daily at 12 noon.  . [DISCONTINUED] oxyCODONE (ROXICODONE) 5 MG immediate release tablet Take 1-2 tablets (5-10 mg total) by mouth every 4 (four) hours as needed for moderate pain or severe pain.    PHQ 2/9 Scores 07/12/2020 12/22/2019 04/30/2019 11/07/2018  PHQ - 2 Score 0 0 0 0  PHQ- 9 Score 0 0 0 1    GAD 7 : Generalized Anxiety Score 07/12/2020 12/22/2019 04/30/2019  11/07/2018  Nervous, Anxious, on Edge 0 0 0 0  Control/stop worrying 0 0 0 0  Worry too much - different things 0 0 0 0  Trouble relaxing 0 0 0 0  Restless 0 0 0 0  Easily annoyed or irritable 0 0 0 0  Afraid - awful might happen 0 0 0 0  Total GAD 7 Score 0 0 0 0    BP Readings from Last 3 Encounters:  07/12/20 130/70  12/23/19 138/69  12/22/19 110/70    Physical Exam Vitals and nursing note reviewed.  Constitutional:      General: He is not irritable. HENT:     Head: Normocephalic.     Right Ear: Tympanic membrane, ear canal and external ear normal.     Left Ear: Tympanic membrane, ear canal and external ear normal.     Nose: Nose normal. No congestion or rhinorrhea.     Mouth/Throat:     Mouth: Mucous membranes are moist.     Pharynx: No oropharyngeal exudate.  Eyes:  General: No scleral icterus.       Right eye: No discharge.        Left eye: No discharge.     Conjunctiva/sclera: Conjunctivae normal.     Pupils: Pupils are equal, round, and reactive to light.  Neck:     Thyroid: No thyromegaly.     Vascular: No JVD.     Trachea: No tracheal deviation.  Cardiovascular:     Rate and Rhythm: Normal rate and regular rhythm.     Heart sounds: Normal heart sounds. No murmur heard. No friction rub. No gallop.   Pulmonary:     Effort: No respiratory distress.     Breath sounds: Normal breath sounds. No stridor. No wheezing, rhonchi or rales.  Chest:     Chest wall: No tenderness.  Abdominal:     General: Bowel sounds are normal.     Palpations: Abdomen is soft. There is no mass.     Tenderness: There is no abdominal tenderness. There is no guarding or rebound.  Musculoskeletal:        General: No tenderness. Normal range of motion.     Cervical back: Normal range of motion and neck supple.  Feet:     Right foot:     Protective Sensation: 10 sites tested. 10 sites sensed.     Skin integrity: Callus and fissure present. No ulcer, blister, skin breakdown,  erythema, warmth or dry skin.     Toenail Condition: Right toenails are normal.     Left foot:     Protective Sensation: 10 sites tested. 10 sites sensed.     Skin integrity: Callus and fissure present. No ulcer, blister, skin breakdown, erythema, warmth or dry skin.     Toenail Condition: Left toenails are normal.  Lymphadenopathy:     Cervical: No cervical adenopathy.  Skin:    General: Skin is warm.     Findings: No rash.  Neurological:     Mental Status: He is alert and oriented to person, place, and time.     Cranial Nerves: No cranial nerve deficit.     Deep Tendon Reflexes: Reflexes are normal and symmetric.     Wt Readings from Last 3 Encounters:  07/12/20 255 lb (115.7 kg)  12/23/19 253 lb 8.5 oz (115 kg)  12/22/19 254 lb (115.2 kg)    BP 130/70   Pulse 64   Ht 6\' 1"  (1.854 m)   Wt 255 lb (115.7 kg)   BMI 33.64 kg/m   Assessment and Plan:  1. Essential hypertension Chronic.  Controlled.  Stable.  Continue amlodipine 10 mg once a day.  We will also continue losartan 100 mg 1/2 tablet daily as well as Dyazide once a day.  Will check CMP for electrolytes and GFR. - amLODipine (NORVASC) 10 MG tablet; Take 1 tablet (10 mg total) by mouth daily.  Dispense: 90 tablet; Refill: 1 - losartan (COZAAR) 100 MG tablet; Take 0.5 tablets (50 mg total) by mouth daily.  Dispense: 45 tablet; Refill: 1 - triamterene-hydrochlorothiazide (DYAZIDE) 37.5-25 MG capsule; Take 1 each (1 capsule total) by mouth daily.  Dispense: 90 capsule; Refill: 1 - Renal Function Panel - Comprehensive Metabolic Panel (CMET)  2. Recurrent major depressive disorder, in partial remission (HCC) Chronic.  Controlled.  Stable.  PHQ is 0 Gad score is 1.  We will continue Wellbutrin 75 mg twice a day and Zoloft 50 mg once a day. - buPROPion (WELLBUTRIN) 75 MG tablet; Take 1 tablet (75 mg total)  by mouth 2 (two) times daily.  Dispense: 180 tablet; Refill: 1 - sertraline (ZOLOFT) 50 MG tablet; Take 1 tablet (50 mg  total) by mouth daily.  Dispense: 90 tablet; Refill: 1  3. Type 2 diabetes mellitus without complication, without long-term current use of insulin (HCC) Chronic.  Uncontrolled.  Patient is to be seen by Dr. Gabriel Carina in the near future in the meantime we will check his A1c. - HgB A1c  4. History of posttraumatic stress disorder (PTSD) Chronic.  Controlled.  Stable.  PHQ is 0.  Continue Zoloft 50 mg once a day. - sertraline (ZOLOFT) 50 MG tablet; Take 1 tablet (50 mg total) by mouth daily.  Dispense: 90 tablet; Refill: 1  5. Anxiety Chronic.  Controlled.  Stable.  Gad score is 1.  Continue sertraline 50 mg once a day. - sertraline (ZOLOFT) 50 MG tablet; Take 1 tablet (50 mg total) by mouth daily.  Dispense: 90 tablet; Refill: 1  6. Fissure in skin of both feet Patient has fissures on both heels.  He is diabetic which is currently not at optimal control.  We will refer to dermatology for suggestions as to care at this situation. - Ambulatory referral to Dermatology  7. Familial hypercholesterolemia Chronic.  Controlled.  Stable.  Continue rosuvastatin 10 mg once a day.  Will check lipid panel for current LDL status. - rosuvastatin (CRESTOR) 10 MG tablet; Take 1 tablet (10 mg total) by mouth daily.  Dispense: 90 tablet; Refill: 1 - Lipid Panel With LDL/HDL Ratio  8. Gastroesophageal reflux disease, unspecified whether esophagitis present Chronic.  Controlled.  Stable.  Continue pantoprazole 40 mg once a day.  We will refer patient to gastroenterology for colonoscopy. - Ambulatory referral to Gastroenterology - pantoprazole (PROTONIX) 40 MG tablet; Take 1 tablet (40 mg total) by mouth daily.  Dispense: 90 tablet; Refill: 1

## 2020-07-13 LAB — COMPREHENSIVE METABOLIC PANEL
ALT: 26 IU/L (ref 0–44)
AST: 22 IU/L (ref 0–40)
Albumin/Globulin Ratio: 1.7 (ref 1.2–2.2)
Albumin: 4.6 g/dL (ref 3.8–4.8)
Alkaline Phosphatase: 86 IU/L (ref 44–121)
BUN/Creatinine Ratio: 19 (ref 10–24)
BUN: 17 mg/dL (ref 8–27)
Bilirubin Total: 1.1 mg/dL (ref 0.0–1.2)
CO2: 24 mmol/L (ref 20–29)
Calcium: 9.7 mg/dL (ref 8.6–10.2)
Chloride: 100 mmol/L (ref 96–106)
Creatinine, Ser: 0.89 mg/dL (ref 0.76–1.27)
Globulin, Total: 2.7 g/dL (ref 1.5–4.5)
Glucose: 180 mg/dL — ABNORMAL HIGH (ref 65–99)
Potassium: 3.7 mmol/L (ref 3.5–5.2)
Sodium: 141 mmol/L (ref 134–144)
Total Protein: 7.3 g/dL (ref 6.0–8.5)
eGFR: 95 mL/min/{1.73_m2} (ref >59)

## 2020-07-13 LAB — LIPID PANEL WITH LDL/HDL RATIO
Cholesterol, Total: 143 mg/dL (ref 100–199)
HDL: 46 mg/dL (ref >39)
LDL Chol Calc (NIH): 81 mg/dL (ref 0–99)
LDL/HDL Ratio: 1.8 ratio (ref 0.0–3.6)
Triglycerides: 84 mg/dL (ref 0–149)
VLDL Cholesterol Cal: 16 mg/dL (ref 5–40)

## 2020-07-13 LAB — HEMOGLOBIN A1C
Est. average glucose Bld gHb Est-mCnc: 177 mg/dL
Hgb A1c MFr Bld: 7.8 % — ABNORMAL HIGH (ref 4.8–5.6)

## 2020-07-21 DIAGNOSIS — E119 Type 2 diabetes mellitus without complications: Secondary | ICD-10-CM | POA: Diagnosis not present

## 2020-07-23 DIAGNOSIS — L851 Acquired keratosis [keratoderma] palmaris et plantaris: Secondary | ICD-10-CM | POA: Diagnosis not present

## 2020-07-23 DIAGNOSIS — L821 Other seborrheic keratosis: Secondary | ICD-10-CM | POA: Diagnosis not present

## 2020-07-23 DIAGNOSIS — D1801 Hemangioma of skin and subcutaneous tissue: Secondary | ICD-10-CM | POA: Diagnosis not present

## 2020-08-02 DIAGNOSIS — M4802 Spinal stenosis, cervical region: Secondary | ICD-10-CM | POA: Diagnosis not present

## 2020-08-02 DIAGNOSIS — M5412 Radiculopathy, cervical region: Secondary | ICD-10-CM | POA: Diagnosis not present

## 2020-08-02 DIAGNOSIS — M25512 Pain in left shoulder: Secondary | ICD-10-CM | POA: Diagnosis not present

## 2020-08-02 DIAGNOSIS — M19012 Primary osteoarthritis, left shoulder: Secondary | ICD-10-CM | POA: Diagnosis not present

## 2020-08-03 ENCOUNTER — Other Ambulatory Visit: Payer: Self-pay | Admitting: Physician Assistant

## 2020-08-03 DIAGNOSIS — M5412 Radiculopathy, cervical region: Secondary | ICD-10-CM

## 2020-08-03 DIAGNOSIS — M25512 Pain in left shoulder: Secondary | ICD-10-CM

## 2020-08-03 DIAGNOSIS — M4802 Spinal stenosis, cervical region: Secondary | ICD-10-CM

## 2020-08-08 ENCOUNTER — Other Ambulatory Visit: Payer: Self-pay | Admitting: Family Medicine

## 2020-08-08 DIAGNOSIS — E119 Type 2 diabetes mellitus without complications: Secondary | ICD-10-CM

## 2020-08-08 NOTE — Telephone Encounter (Signed)
Requested Prescriptions  Pending Prescriptions Disp Refills  . glipiZIDE (GLUCOTROL) 5 MG tablet [Pharmacy Med Name: GLIPIZIDE 5 MG TABLET] 180 tablet 1    Sig: TAKE 1 TABLET BY MOUTH 2 TIMES DAILY BEFORE A MEAL     Endocrinology:  Diabetes - Sulfonylureas Passed - 08/08/2020  2:30 PM      Passed - HBA1C is between 0 and 7.9 and within 180 days    Hgb A1c MFr Bld  Date Value Ref Range Status  07/12/2020 7.8 (H) 4.8 - 5.6 % Final    Comment:             Prediabetes: 5.7 - 6.4          Diabetes: >6.4          Glycemic control for adults with diabetes: <7.0          Passed - Valid encounter within last 6 months    Recent Outpatient Visits          3 weeks ago Essential hypertension   [facility] Juline Patch, MD   7 months ago Type 2 diabetes mellitus without complication, without long-term current use of insulin ([place])   [facility] Juline Patch, MD   1 year ago Taking medication for chronic disease   [facility] Juline Patch, MD   1 year ago Type 2 diabetes mellitus without complication, without long-term current use of insulin ([place])   [facility] Juline Patch, MD   2 years ago Type 2 diabetes mellitus without complication, without long-term current use of insulin ([place])   [facility] Juline Patch, MD      Future Appointments            In 5 months Juline Patch, MD [facility]           . metFORMIN (GLUCOPHAGE) 500 MG tablet [Pharmacy Med Name: METFORMIN HCL 500 MG TABLET] 180 tablet 1    Sig: TAKE 1 TABLET BY MOUTH 2 TIMES DAILY WITH A MEAL.     Endocrinology:  Diabetes - Biguanides Passed - 08/08/2020  2:30 PM      Passed - Cr in normal range and within 360 days    Creatinine, Ser  Date Value Ref Range Status  07/12/2020 0.89 0.76 - 1.27 mg/dL Final         Passed - HBA1C is between 0 and 7.9 and within 180 days    Hgb A1c MFr Bld  Date Value Ref Range Status  07/12/2020 7.8 (H) 4.8  - 5.6 % Final    Comment:             Prediabetes: 5.7 - 6.4          Diabetes: >6.4          Glycemic control for adults with diabetes: <7.0          Passed - eGFR in normal range and within 360 days    GFR calc Af Amer  Date Value Ref Range Status  04/30/2019 106 >59 mL/min/1.73 Final   GFR, Estimated  Date Value Ref Range Status  12/16/2019 >60 >60 mL/min Final    Comment:    (NOTE) Calculated using the CKD-EPI Creatinine Equation (2021)    eGFR  Date Value Ref Range Status  07/12/2020 95 >59 mL/min/1.73 Final         Passed - Valid encounter within last 6 months  Recent Outpatient Visits          3 weeks ago Essential hypertension   [facility] Juline Patch, MD   7 months ago Type 2 diabetes mellitus without complication, without long-term current use of insulin ([place])   [facility] Juline Patch, MD   1 year ago Taking medication for chronic disease   [facility] Juline Patch, MD   1 year ago Type 2 diabetes mellitus without complication, without long-term current use of insulin ([place])   [facility] Juline Patch, MD   2 years ago Type 2 diabetes mellitus without complication, without long-term current use of insulin ([place])   [facility] Juline Patch, MD      Future Appointments            In 5 months Juline Patch, MD [facility]

## 2020-08-16 DIAGNOSIS — G4733 Obstructive sleep apnea (adult) (pediatric): Secondary | ICD-10-CM | POA: Diagnosis not present

## 2020-08-17 DIAGNOSIS — E782 Mixed hyperlipidemia: Secondary | ICD-10-CM | POA: Diagnosis not present

## 2020-08-17 DIAGNOSIS — E119 Type 2 diabetes mellitus without complications: Secondary | ICD-10-CM | POA: Diagnosis not present

## 2020-08-17 DIAGNOSIS — I10 Essential (primary) hypertension: Secondary | ICD-10-CM | POA: Diagnosis not present

## 2020-08-20 DIAGNOSIS — E119 Type 2 diabetes mellitus without complications: Secondary | ICD-10-CM | POA: Diagnosis not present

## 2020-08-25 ENCOUNTER — Other Ambulatory Visit: Payer: Self-pay

## 2020-08-25 ENCOUNTER — Ambulatory Visit
Admission: RE | Admit: 2020-08-25 | Discharge: 2020-08-25 | Disposition: A | Discharge status: Home / Self Care | Payer: BC Managed Care – PPO | Source: Ambulatory Visit | Attending: Physician Assistant | Admitting: Physician Assistant

## 2020-08-25 DIAGNOSIS — M2578 Osteophyte, vertebrae: Secondary | ICD-10-CM | POA: Diagnosis not present

## 2020-08-25 DIAGNOSIS — M5412 Radiculopathy, cervical region: Secondary | ICD-10-CM | POA: Diagnosis not present

## 2020-08-25 DIAGNOSIS — M4802 Spinal stenosis, cervical region: Secondary | ICD-10-CM | POA: Diagnosis not present

## 2020-08-25 DIAGNOSIS — M25512 Pain in left shoulder: Secondary | ICD-10-CM | POA: Diagnosis not present

## 2020-08-25 DIAGNOSIS — M47812 Spondylosis without myelopathy or radiculopathy, cervical region: Secondary | ICD-10-CM | POA: Diagnosis not present

## 2020-09-10 DIAGNOSIS — M542 Cervicalgia: Secondary | ICD-10-CM | POA: Diagnosis not present

## 2020-09-10 DIAGNOSIS — M5441 Lumbago with sciatica, right side: Secondary | ICD-10-CM | POA: Diagnosis not present

## 2020-09-10 DIAGNOSIS — M5412 Radiculopathy, cervical region: Secondary | ICD-10-CM | POA: Diagnosis not present

## 2020-09-10 DIAGNOSIS — M9931 Osseous stenosis of neural canal of cervical region: Secondary | ICD-10-CM | POA: Diagnosis not present

## 2020-09-16 DIAGNOSIS — M5412 Radiculopathy, cervical region: Secondary | ICD-10-CM | POA: Diagnosis not present

## 2020-09-16 DIAGNOSIS — E119 Type 2 diabetes mellitus without complications: Secondary | ICD-10-CM | POA: Diagnosis not present

## 2020-09-20 DIAGNOSIS — E119 Type 2 diabetes mellitus without complications: Secondary | ICD-10-CM | POA: Diagnosis not present

## 2020-10-04 DIAGNOSIS — G5602 Carpal tunnel syndrome, left upper limb: Secondary | ICD-10-CM | POA: Diagnosis not present

## 2020-10-04 DIAGNOSIS — M542 Cervicalgia: Secondary | ICD-10-CM | POA: Diagnosis not present

## 2020-10-04 DIAGNOSIS — M5412 Radiculopathy, cervical region: Secondary | ICD-10-CM | POA: Diagnosis not present

## 2020-10-04 DIAGNOSIS — M5441 Lumbago with sciatica, right side: Secondary | ICD-10-CM | POA: Diagnosis not present

## 2020-10-06 DIAGNOSIS — M1712 Unilateral primary osteoarthritis, left knee: Secondary | ICD-10-CM | POA: Diagnosis not present

## 2020-10-06 DIAGNOSIS — M7582 Other shoulder lesions, left shoulder: Secondary | ICD-10-CM | POA: Diagnosis not present

## 2020-10-06 DIAGNOSIS — M19012 Primary osteoarthritis, left shoulder: Secondary | ICD-10-CM | POA: Diagnosis not present

## 2020-10-06 DIAGNOSIS — M25562 Pain in left knee: Secondary | ICD-10-CM | POA: Diagnosis not present

## 2020-10-19 ENCOUNTER — Other Ambulatory Visit: Payer: Self-pay | Admitting: Surgery

## 2020-10-21 DIAGNOSIS — E119 Type 2 diabetes mellitus without complications: Secondary | ICD-10-CM | POA: Diagnosis not present

## 2020-11-01 ENCOUNTER — Encounter
Admission: RE | Admit: 2020-11-01 | Discharge: 2020-11-01 | Disposition: A | Discharge status: Home / Self Care | Payer: BC Managed Care – PPO | Source: Ambulatory Visit | Attending: Surgery | Admitting: Surgery

## 2020-11-01 ENCOUNTER — Other Ambulatory Visit: Payer: Self-pay

## 2020-11-01 DIAGNOSIS — Z01818 Encounter for other preprocedural examination: Secondary | ICD-10-CM | POA: Insufficient documentation

## 2020-11-01 DIAGNOSIS — Z01810 Encounter for preprocedural cardiovascular examination: Secondary | ICD-10-CM | POA: Diagnosis not present

## 2020-11-01 LAB — COMPREHENSIVE METABOLIC PANEL
ALT: 16 U/L (ref 0–44)
AST: 17 U/L (ref 15–41)
Albumin: 4.4 g/dL (ref 3.5–5.0)
Alkaline Phosphatase: 64 U/L (ref 38–126)
Anion gap: 9 (ref 5–15)
BUN: 26 mg/dL — ABNORMAL HIGH (ref 8–23)
CO2: 27 mmol/L (ref 22–32)
Calcium: 9.8 mg/dL (ref 8.9–10.3)
Chloride: 103 mmol/L (ref 98–111)
Creatinine, Ser: 0.97 mg/dL (ref 0.61–1.24)
GFR, Estimated: >60 mL/min (ref >60)
Glucose, Bld: 118 mg/dL — ABNORMAL HIGH (ref 70–99)
Potassium: 3.9 mmol/L (ref 3.5–5.1)
Sodium: 139 mmol/L (ref 135–145)
Total Bilirubin: 1.3 mg/dL — ABNORMAL HIGH (ref 0.3–1.2)
Total Protein: 7.7 g/dL (ref 6.5–8.1)

## 2020-11-01 LAB — URINALYSIS, ROUTINE W REFLEX MICROSCOPIC
Bilirubin Urine: NEGATIVE
Glucose, UA: NEGATIVE mg/dL
Hgb urine dipstick: NEGATIVE
Ketones, ur: 5 mg/dL — AB
Leukocytes,Ua: NEGATIVE
Nitrite: NEGATIVE
Protein, ur: 30 mg/dL — AB
Specific Gravity, Urine: 1.031 — ABNORMAL HIGH (ref 1.005–1.030)
pH: 5 (ref 5.0–8.0)

## 2020-11-01 LAB — CBC WITH DIFFERENTIAL/PLATELET
Abs Immature Granulocytes: 0.03 10*3/uL (ref 0.00–0.07)
Basophils Absolute: 0.1 10*3/uL (ref 0.0–0.1)
Basophils Relative: 1 %
Eosinophils Absolute: 0.2 10*3/uL (ref 0.0–0.5)
Eosinophils Relative: 3 %
HCT: 43.9 % (ref 39.0–52.0)
Hemoglobin: 14.9 g/dL (ref 13.0–17.0)
Immature Granulocytes: 0 %
Lymphocytes Relative: 14 %
Lymphs Abs: 1.1 10*3/uL (ref 0.7–4.0)
MCH: 29.3 pg (ref 26.0–34.0)
MCHC: 33.9 g/dL (ref 30.0–36.0)
MCV: 86.4 fL (ref 80.0–100.0)
Monocytes Absolute: 0.7 10*3/uL (ref 0.1–1.0)
Monocytes Relative: 10 %
Neutro Abs: 5.6 10*3/uL (ref 1.7–7.7)
Neutrophils Relative %: 72 %
Platelets: 198 10*3/uL (ref 150–400)
RBC: 5.08 MIL/uL (ref 4.22–5.81)
RDW: 13 % (ref 11.5–15.5)
WBC: 7.8 10*3/uL (ref 4.0–10.5)
nRBC: 0 % (ref 0.0–0.2)

## 2020-11-01 LAB — SURGICAL PCR SCREEN
MRSA, PCR: NEGATIVE
Staphylococcus aureus: NEGATIVE

## 2020-11-01 NOTE — Patient Instructions (Signed)
Your procedure is scheduled on: 11/11/20 Report to [facility]. To find out your arrival time please call [phone] between 1PM - 3PM on 11/10/20.  Remember: Instructions that are not followed completely may result in serious medical risk, up to and including death, or upon the discretion of your surgeon and anesthesiologist your surgery may need to be rescheduled.     _X__ 1. Do not eat food after midnight the night before your procedure.                 No gum chewing or hard candies. You may drink clear liquids up to 2 hours                 before you are scheduled to arrive for your surgery-      Diabetics water only  __X__2.  On the morning of surgery brush your teeth with toothpaste and water, you                 may rinse your mouth with mouthwash if you wish.  Do not swallow any              toothpaste of mouthwash.     _X__ 3.  No Alcohol for 24 hours before or after surgery.   _X__ 4.  Do Not Smoke or use e-cigarettes For 24 Hours Prior to Your Surgery.                 Do not use any chewable tobacco products for at least 6 hours prior to                 surgery.  ____  5.  Bring all medications with you on the day of surgery if instructed.   __X__  6.  Notify your doctor if there is any change in your medical condition      (cold, fever, infections).     Do not wear jewelry, make-up, hairpins, clips or nail polish. Do not wear lotions, powders, or perfumes.  Do not shave body hair 48 hours prior to surgery. Men may shave face and neck. Do not bring valuables to the hospital.    [facility] is not responsible for any belongings or valuables.  Contacts, dentures/partials or body piercings may not be worn into surgery. Bring a case for your contacts, glasses or hearing aids, a denture cup will be supplied. Leave your suitcase in the car. After surgery it may be brought to your room. For patients admitted to the  hospital, discharge time is determined by your treatment team.   Patients discharged the day of surgery will not be allowed to drive home.   Please read over the following fact sheets that you were given:   MRSA Information, CHG soap, Incentive spirometer  __X__ Take these medicines the morning of surgery with A SIP OF WATER:    1. amLODipine (NORVASC) 10 MG tablet  2. buPROPion (WELLBUTRIN) 75 MG tablet  3. gabapentin (NEURONTIN) 900 MG capsule  4. pantoprazole (PROTONIX) 40 MG tablet  5. rosuvastatin (CRESTOR) 10 MG tablet  6. sertraline (ZOLOFT) 50 MG tablet  ____ Fleet Enema (as directed)   __X__ Use CHG Soap/SAGE wipes as directed  ____ Use inhalers on the day of surgery  __X__ Stop metformin/Janumet/Farxiga 2 days prior to surgery    ____ Take 1/2 of usual insulin dose the night before surgery. No insulin the morning  of surgery.   ____ Stop Blood Thinners Coumadin/Plavix/Xarelto/Pleta/Pradaxa/Eliquis/Effient/Aspirin  on   Or contact your Surgeon, Cardiologist or Medical Doctor regarding  ability to stop your blood thinners  __X__ Stop Anti-inflammatories 7 days before surgery such as Advil, Ibuprofen, Motrin,  BC or Goodies Powder, Naprosyn, Naproxen, Aleve, Aspirin    __X__ Stop all herbal and vitamin supplements, fish oil or vitamin E until after surgery.    __x__ Bring C-Pap to the hospital.

## 2020-11-02 LAB — TYPE AND SCREEN
ABO/RH(D): AB POS
Antibody Screen: NEGATIVE

## 2020-11-03 ENCOUNTER — Encounter: Payer: Self-pay | Admitting: Surgery

## 2020-11-03 NOTE — Progress Notes (Signed)
Perioperative Services  Pre-Admission/Anesthesia Testing Clinical Review  Date: 11/03/20  Patient Demographics:  Name: Raymond [name] DOB:   April 20, 1955 MRN:   [id number]  Planned Surgical Procedure(s):    Case: [id number] Date/Time: 11/11/20 0715   Procedure: TOTAL KNEE ARTHROPLASTY (Left: Knee)   Anesthesia type: Choice   Pre-op diagnosis: PRIMARY OSTEOARTHRITIS OF LEFT KNEE.   Location: [facility] OR ROOM 03 / [place] [facility] FOR ANESTHESIA GROUP   Surgeons: Corky Mull, MD     NOTE: Available PAT nursing documentation and vital signs have been reviewed. Clinical nursing staff has updated patient's PMH/PSHx, current medication list, and drug allergies/intolerances to ensure comprehensive history available to assist in medical decision making as it pertains to the aforementioned surgical procedure and anticipated anesthetic course. Extensive review of available clinical information performed. [place] PMH and PSHx updated with any diagnoses/procedures that  may have been inadvertently omitted during his intake with the pre-admission testing department's nursing staff.  Clinical Discussion:  HADRIEN [name] is a 65 y.o. male who is submitted for pre-surgical anesthesia review and clearance prior to him undergoing the above procedure. Patient has never been a smoker. Pertinent PMH includes: angina, carotid stenosis, valvular heart disease, diastolic dysfunction, biatrial enlargement, CVA, PVD, HTN, HLD, T2DM, OSA (on nocturnal PAP therapy), asthma, GERD (on daily PPI therapy), OA, cervical spinal stenosis, depression  Patient is followed by cardiology [name], MD). He was last seen in the cardiology clinic on 02/09/2020; notes reviewed. At the time of his clinic visit, the patient denied any chest pain, shortness of breath, PND, orthopnea, palpitations, significant peripheral edema, vertiginous symptoms, or presyncope/syncope.  Patient with a PMH significant for cardiovascular diagnoses.  Myocardial  perfusion imaging on 07/17/2019 revealed no evidence of stress-induced myocardial ischemia; LVEF 55%.    TTE on 07/17/2019 revealed normal LV systolic function; LVEF 123456.  There was mild biatrial enlargement.  Diastolic parameters consistent with G1 DD.  There was mild panvalvular regurgitation noted.   Blood pressure documented at 130/90 on prescribed CCB, ARB, and diuretic therapies.  Patient is on a statin for his HLD.  T2DM reasonably controlled on currently prescribed regimen; last Hgb was mildly elevated above goal at 7.8% when checked on 07/12/2020.  Functional capacity somewhat limited by orthopedic pain, however patient still felt to be able to achieve at least 4 METS of activity without angina/anginal equivalent symptoms.  No changes were made to his medication regimen.  Patient to follow-up with outpatient cardiology in 1 year or sooner if needed.  [name] is scheduled for an LEFT TOTAL KNEE ARTHROPLASTY on 11/11/2020 with Dr. Milagros Evener, MD.  Given patient's past medical history significant for cardiovascular diagnoses, presurgical cardiac clearance was sought by the PAT team. "The patient is at the lowest risk possible for perioperative cardiovascular complications with the planned procedure.  The overall risk his procedure is low (<1%).  Currently has no evidence active and/or significant angina and/or congestive heart failure. Patient may proceed to surgery without restriction or need for further cardiovascular testing and an overall LOW risk".  This patient is on daily antiplatelet therapy.  He has been instructed on recommendations for holding his daily low-dose ASA for 4 days prior to his procedure with plans to restart as soon as postoperative bleeding risk not be minimized by his primary attending surgeon.  Patient is aware that his last dose of ASA will be on 11/06/2020.  Patient denies previous perioperative complications with anesthesia in the past. In review of the available  records, it is noted that patient underwent a neuraxial anesthetic course here (ASA III) in 12/2019 without documented complications.   Vitals with BMI 11/01/2020 08/25/2020 07/12/2020  Height '6\' 1"'$  - '6\' 1"'$   Weight 258 lbs 255 lbs 255 lbs  BMI 0000000 - 123456  Systolic [id number] - [id number]  Diastolic 72 - 70  Pulse 70 - 64    Providers/Specialists:   NOTE: Primary physician provider listed below. Patient may have been seen by APP or partner within same practice.   PROVIDER ROLE / SPECIALTY LAST OV  Poggi, Marshall Cork, MD Orthopedics (Surgeon) 11/01/2020  Juline Patch, MD Primary Care Provider 07/12/2020  Serafina Royals, MD Cardiology 02/09/2020  Mee Hives, MD Endocrinology 08/09/2020   Allergies:  Patient has no known allergies.  Current Home Medications:   No current facility-administered medications for this encounter.    amLODipine (NORVASC) 10 MG tablet   aspirin EC 81 MG tablet   buPROPion (WELLBUTRIN) 75 MG tablet   cyclobenzaprine (FLEXERIL) 10 MG tablet   fluticasone (FLONASE) 50 MCG/ACT nasal spray   gabapentin (NEURONTIN) 300 MG capsule   glipiZIDE (GLUCOTROL) 5 MG tablet   Glucosamine-Chondroit-Vit C-Mn (GLUCOSAMINE CHONDR 1500 COMPLX PO)   losartan (COZAAR) 100 MG tablet   metFORMIN (GLUCOPHAGE) 500 MG tablet   Omega-3 Fatty Acids (FISH OIL) 1000 MG CAPS   pantoprazole (PROTONIX) 40 MG tablet   rosuvastatin (CRESTOR) 10 MG tablet   sertraline (ZOLOFT) 50 MG tablet   traMADol (ULTRAM) 50 MG tablet   triamterene-hydrochlorothiazide (DYAZIDE) 37.5-25 MG capsule   ACCU-CHEK AVIVA PLUS test strip   apixaban (ELIQUIS) 2.5 MG TABS tablet   History:   Past Medical History:  Diagnosis Date   Allergy    Arthritis    Biatrial enlargement    Carotid stenosis    Carpal tunnel syndrome, bilateral    Degenerative cervical spinal stenosis    Depression    GERD (gastroesophageal reflux disease)    Grade I diastolic dysfunction    a.) TTE on 07/17/2019 --> EF >55%; mild  panvalvular regurgitation; G1DD.   Hyperlipidemia    Hypertension    Lumbar radiculitis    Mild intermittent asthma    OSA on CPAP    PVD (peripheral vascular disease) (HCC)    Stroke (HCC)     no residual effects   T2DM (type 2 diabetes mellitus) ([place])    Valvular regurgitation    a.) TTE on 07/17/2019 --> trival PR; mild AR, MR, TR   Varicose vein    Past Surgical History:  Procedure Laterality Date   CARPAL TUNNEL RELEASE Right 01/02/2019   Procedure: CARPAL TUNNEL RELEASE ENDOSCOPIC;  Surgeon: Corky Mull, MD;  Location: [facility] ORS;  Service: Orthopedics;  Laterality: Right;   CHOLECYSTECTOMY     CHONDROPLASTY  10/05/09   knee- shaving patellofemoral joint   COLONOSCOPY  2009?   ENDOVENOUS ABLATION SAPHENOUS VEIN W/ LASER     INJECTION KNEE Left 12/23/2019   Procedure: KNEE INJECTION;  Surgeon: Corky Mull, MD;  Location: [facility] ORS;  Service: Orthopedics;  Laterality: Left;   JOINT REPLACEMENT Left    hip   KNEE ARTHROSCOPY Left 07/25/2017   Procedure: Arthroscopic partial medial meniscectomy; arthroscopic debridement; and arthroscopic abrasion chondroplasty of medial femoral condyle, lateral tibial plateau, and femoral trochlea; left knee.;  Surgeon: Corky Mull, MD;  Location: [place];  Service: Orthopedics;  Laterality: Left;   KNEE ARTHROSCOPY WITH MEDIAL MENISECTOMY Right    Partial medial and lateral menisectomy  SHOULDER ARTHROSCOPY Left 08/10/2014   Procedure: ARTHROSCOPY SHOULDER WITH LIMITED DEBRIDEMENT, REMOVAL OF LOOSE BODY AND RELEASE OF LONG END BICEPS TENDON;  Surgeon: Leanor Kail, MD;  Location: [place];  Service: Orthopedics;  Laterality: Left;   TOTAL HIP ARTHROPLASTY Left 01/25/2016   Procedure: TOTAL HIP ARTHROPLASTY;  Surgeon: Corky Mull, MD;  Location: [facility] ORS;  Service: Orthopedics;  Laterality: Left;   TOTAL KNEE ARTHROPLASTY Right 12/23/2019   Procedure: TOTAL KNEE ARTHROPLASTY;  Surgeon: Corky Mull, MD;  Location: [facility]  ORS;  Service: Orthopedics;  Laterality: Right;   No family history on file. Social History   Tobacco Use   Smoking status: Never   Smokeless tobacco: Never  Vaping Use   Vaping Use: Never used  Substance Use Topics   Alcohol use: Yes    Comment: rare   Drug use: No    Pertinent Clinical Results:  LABS: Labs reviewed: Acceptable for surgery.  Hospital Outpatient Visit on 11/01/2020  Component Date Value Ref Range Status   WBC 11/01/2020 7.8  4.0 - 10.5 K/uL Final   RBC 11/01/2020 5.08  4.22 - 5.81 MIL/uL Final   Hemoglobin 11/01/2020 14.9  13.0 - 17.0 g/dL Final   HCT 11/01/2020 43.9  39.0 - 52.0 % Final   MCV 11/01/2020 86.4  80.0 - 100.0 fL Final   MCH 11/01/2020 29.3  26.0 - 34.0 pg Final   MCHC 11/01/2020 33.9  30.0 - 36.0 g/dL Final   RDW 11/01/2020 13.0  11.5 - 15.5 % Final   Platelets 11/01/2020 198  150 - 400 K/uL Final   nRBC 11/01/2020 0.0  0.0 - 0.2 % Final   Neutrophils Relative % 11/01/2020 72  % Final   Neutro Abs 11/01/2020 5.6  1.7 - 7.7 K/uL Final   Lymphocytes Relative 11/01/2020 14  % Final   Lymphs Abs 11/01/2020 1.1  0.7 - 4.0 K/uL Final   Monocytes Relative 11/01/2020 10  % Final   Monocytes Absolute 11/01/2020 0.7  0.1 - 1.0 K/uL Final   Eosinophils Relative 11/01/2020 3  % Final   Eosinophils Absolute 11/01/2020 0.2  0.0 - 0.5 K/uL Final   Basophils Relative 11/01/2020 1  % Final   Basophils Absolute 11/01/2020 0.1  0.0 - 0.1 K/uL Final   Immature Granulocytes 11/01/2020 0  % Final   Abs Immature Granulocytes 11/01/2020 0.03  0.00 - 0.07 K/uL Final   Performed at [facility], [place], [place] [address]   Sodium 11/01/2020 139  135 - 145 mmol/L Final   Potassium 11/01/2020 3.9  3.5 - 5.1 mmol/L Final   Chloride 11/01/2020 103  98 - 111 mmol/L Final   CO2 11/01/2020 27  22 - 32 mmol/L Final   Glucose, Bld 11/01/2020 118 (A) 70 - 99 mg/dL Final   Glucose reference range applies only to samples taken after fasting for at  least 8 hours.   BUN 11/01/2020 26 (A) 8 - 23 mg/dL Final   Creatinine, Ser 11/01/2020 0.97  0.61 - 1.24 mg/dL Final   Calcium 11/01/2020 9.8  8.9 - 10.3 mg/dL Final   Total Protein 11/01/2020 7.7  6.5 - 8.1 g/dL Final   Albumin 11/01/2020 4.4  3.5 - 5.0 g/dL Final   AST 11/01/2020 17  15 - 41 U/L Final   ALT 11/01/2020 16  0 - 44 U/L Final   Alkaline Phosphatase 11/01/2020 64  38 - 126 U/L Final   Total Bilirubin 11/01/2020 1.3 (A) 0.3 -  1.2 mg/dL Final   GFR, Estimated 11/01/2020 >60  >60 mL/min Final   Comment: (NOTE) Calculated using the CKD-EPI Creatinine Equation (2021)    Anion gap 11/01/2020 9  5 - 15 Final   Performed at [facility], [place], [place] [address]   Color, Urine 11/01/2020 AMBER (A) YELLOW Final   BIOCHEMICALS MAY BE AFFECTED BY COLOR   APPearance 11/01/2020 HAZY (A) CLEAR Final   Specific Gravity, Urine 11/01/2020 1.031 (A) 1.005 - 1.030 Final   pH 11/01/2020 5.0  5.0 - 8.0 Final   Glucose, UA 11/01/2020 NEGATIVE  NEGATIVE mg/dL Final   Hgb urine dipstick 11/01/2020 NEGATIVE  NEGATIVE Final   Bilirubin Urine 11/01/2020 NEGATIVE  NEGATIVE Final   Ketones, ur 11/01/2020 5 (A) NEGATIVE mg/dL Final   Protein, ur 11/01/2020 30 (A) NEGATIVE mg/dL Final   Nitrite 11/01/2020 NEGATIVE  NEGATIVE Final   Leukocytes,Ua 11/01/2020 NEGATIVE  NEGATIVE Final   RBC / HPF 11/01/2020 0-5  0 - 5 RBC/hpf Final   WBC, UA 11/01/2020 0-5  0 - 5 WBC/hpf Final   Bacteria, UA 11/01/2020 RARE (A) NONE SEEN Final   Squamous Epithelial / LPF 11/01/2020 0-5  0 - 5 Final   Mucus 11/01/2020 PRESENT   Final   Hyaline Casts, UA 11/01/2020 PRESENT   Final   Performed at [facility], [address] [place], [place] [address]   ABO/RH(D) 11/01/2020 AB POS   Final   Antibody Screen 11/01/2020 NEG   Final   Sample Expiration 11/01/2020 11/15/2020,2359   Final   Extend sample reason 11/01/2020    Final                   Value:NO TRANSFUSIONS OR PREGNANCY IN  THE PAST 3 MONTHS Performed at [facility], [address] [place], [place] [address]    MRSA, PCR 11/01/2020 NEGATIVE  NEGATIVE Final   Staphylococcus aureus 11/01/2020 NEGATIVE  NEGATIVE Final   Comment: (NOTE) The Xpert SA Assay (FDA approved for NASAL specimens in patients 51 years of age and older), is one component of a comprehensive surveillance program. It is not intended to diagnose infection nor to guide or monitor treatment. Performed at [facility], [address] [place], [place] [address]     ECG: Date: 11/01/2020 Time ECG obtained: 1458 PM Rate: 63 bpm Rhythm:  63 Axis (leads I and aVF): Normal Intervals: PR 172 ms. QRS 94 ms. QTc 429 ms. ST segment and T wave changes: No evidence of acute ST segment elevation or depression Comparison: Similar to previous tracing obtained on 12/30/2018   IMAGING / PROCEDURES: MRI CERVICAL WITHOUT CONTRAST PERFORMED ON 08/25/2020 Mild multilevel degenerative changes in the cervical spine with mild central stenosis at the C4-C5 and C5-C6 level Moderate right C3-C4 foraminal stenosis Mild bilateral C6-C7 foraminal stenosis Mild left foraminal stenosis C3-C4 and C5-C6 levels  LEXISCAN done on 07/17/2019 LVEF 55% Regional wall motion reveals normal myocardial thickening and wall motion Overall quality of the study is good No artifacts noted Normal left ventricular cavity No evidence of stress-induced myocardial ischemia   ECHOCARDIOGRAM WITH BUBBLE STUDY done on 07/17/2019 LVEF 123456 Normal LV systolic function Normal RV systolic function Mild tricuspid, mitral, and aortic valve insufficiency No valvular stenosis Sclerotic aortic valve Mild RV enlargement Mild biatrial enlargement Negative bubble study   BILATERAL CAROTID DUPLEX done on 03/26/2019 Right Carotid: Velocities in the right ICA are consistent with a 40-59% stenosis. Left Carotid: Velocities in  the left ICA are consistent with a 1-39%  stenosis.  Vertebrals:  Bilateral vertebral arteries demonstrate antegrade flow.  Subclavians: Normal flow hemodynamics were seen in bilateral subclavian arteries.   Impression and Plan:  HANAD SHURN has been referred for pre-anesthesia review and clearance prior to him undergoing the planned anesthetic and procedural courses. Available labs, pertinent testing, and imaging results were personally reviewed by me. This patient has been appropriately cleared by cardiology with an overall LOW risk of significant perioperative cardiovascular complications.  Based on clinical review performed today (11/03/20), barring any significant acute changes in the patient's overall condition, it is anticipated that he will be able to proceed with the planned surgical intervention. Any acute changes in clinical condition may necessitate his procedure being postponed and/or cancelled. Patient will meet with anesthesia team (MD and/or CRNA) on the day of his procedure for preoperative evaluation/assessment. Questions regarding anesthetic course will be fielded at that time.   Pre-surgical instructions were reviewed with the patient during his PAT appointment and questions were fielded by PAT clinical staff. Patient was advised that if any questions or concerns arise prior to his procedure then he should return a call to PAT and/or his surgeon's office to discuss.  Honor Loh, MSN, APRN, FNP-C, CEN [facility]  Peri-operative Services Nurse Practitioner Phone: [phone] Fax: [phone] 11/03/20 1:36 PM  NOTE: This note has been prepared using Dragon dictation software. Despite my best ability to proofread, there is always the potential that unintentional transcriptional errors may still occur from this process.

## 2020-11-08 DIAGNOSIS — E1159 Type 2 diabetes mellitus with other circulatory complications: Secondary | ICD-10-CM | POA: Diagnosis not present

## 2020-11-08 DIAGNOSIS — E559 Vitamin D deficiency, unspecified: Secondary | ICD-10-CM | POA: Diagnosis not present

## 2020-11-08 DIAGNOSIS — E1142 Type 2 diabetes mellitus with diabetic polyneuropathy: Secondary | ICD-10-CM | POA: Diagnosis not present

## 2020-11-08 DIAGNOSIS — E1169 Type 2 diabetes mellitus with other specified complication: Secondary | ICD-10-CM | POA: Diagnosis not present

## 2020-11-08 DIAGNOSIS — E119 Type 2 diabetes mellitus without complications: Secondary | ICD-10-CM | POA: Diagnosis not present

## 2020-11-09 ENCOUNTER — Other Ambulatory Visit: Payer: Self-pay

## 2020-11-09 ENCOUNTER — Other Ambulatory Visit
Admission: RE | Admit: 2020-11-09 | Discharge: 2020-11-09 | Disposition: A | Discharge status: Home / Self Care | Payer: BC Managed Care – PPO | Source: Ambulatory Visit | Attending: Surgery | Admitting: Surgery

## 2020-11-09 ENCOUNTER — Encounter: Payer: Self-pay | Admitting: Urgent Care

## 2020-11-09 DIAGNOSIS — Z20822 Contact with and (suspected) exposure to covid-19: Secondary | ICD-10-CM | POA: Insufficient documentation

## 2020-11-09 DIAGNOSIS — Z01812 Encounter for preprocedural laboratory examination: Secondary | ICD-10-CM | POA: Insufficient documentation

## 2020-11-09 LAB — SARS CORONAVIRUS 2 (TAT 6-24 HRS): SARS Coronavirus 2: NEGATIVE

## 2020-11-11 ENCOUNTER — Ambulatory Visit: Payer: BC Managed Care – PPO

## 2020-11-11 ENCOUNTER — Ambulatory Visit
Admission: RE | Admit: 2020-11-11 | Discharge: 2020-11-11 | Disposition: A | Discharge status: Home / Self Care | Payer: BC Managed Care – PPO | Attending: Surgery | Admitting: Surgery

## 2020-11-11 ENCOUNTER — Ambulatory Visit: Payer: BC Managed Care – PPO | Admitting: Urgent Care

## 2020-11-11 ENCOUNTER — Encounter
Admission: RE | Disposition: A | Discharge status: Home / Self Care | Payer: Self-pay | Source: Home / Self Care | Attending: Surgery

## 2020-11-11 ENCOUNTER — Encounter: Payer: Self-pay | Admitting: Surgery

## 2020-11-11 ENCOUNTER — Other Ambulatory Visit: Payer: Self-pay

## 2020-11-11 DIAGNOSIS — G4733 Obstructive sleep apnea (adult) (pediatric): Secondary | ICD-10-CM | POA: Diagnosis not present

## 2020-11-11 DIAGNOSIS — Z79899 Other long term (current) drug therapy: Secondary | ICD-10-CM | POA: Diagnosis not present

## 2020-11-11 DIAGNOSIS — Z7982 Long term (current) use of aspirin: Secondary | ICD-10-CM | POA: Insufficient documentation

## 2020-11-11 DIAGNOSIS — Z7984 Long term (current) use of oral hypoglycemic drugs: Secondary | ICD-10-CM | POA: Insufficient documentation

## 2020-11-11 DIAGNOSIS — Z9889 Other specified postprocedural states: Secondary | ICD-10-CM | POA: Diagnosis not present

## 2020-11-11 DIAGNOSIS — M1712 Unilateral primary osteoarthritis, left knee: Secondary | ICD-10-CM | POA: Insufficient documentation

## 2020-11-11 DIAGNOSIS — Z471 Aftercare following joint replacement surgery: Secondary | ICD-10-CM | POA: Diagnosis not present

## 2020-11-11 DIAGNOSIS — Z96642 Presence of left artificial hip joint: Secondary | ICD-10-CM | POA: Diagnosis not present

## 2020-11-11 DIAGNOSIS — Z96652 Presence of left artificial knee joint: Secondary | ICD-10-CM

## 2020-11-11 DIAGNOSIS — R600 Localized edema: Secondary | ICD-10-CM | POA: Diagnosis not present

## 2020-11-11 HISTORY — DX: Spinal stenosis, cervical region: M48.02

## 2020-11-11 HISTORY — DX: Carpal tunnel syndrome, bilateral upper limbs: G56.03

## 2020-11-11 HISTORY — DX: Type 2 diabetes mellitus without complications: E11.9

## 2020-11-11 HISTORY — DX: Mild intermittent asthma, uncomplicated: J45.20

## 2020-11-11 HISTORY — PX: TOTAL KNEE ARTHROPLASTY: SHX125

## 2020-11-11 HISTORY — DX: Endocarditis, valve unspecified: I38

## 2020-11-11 HISTORY — DX: Radiculopathy, lumbar region: M54.16

## 2020-11-11 HISTORY — DX: Obstructive sleep apnea (adult) (pediatric): G47.33

## 2020-11-11 HISTORY — DX: Other ill-defined heart diseases: I51.89

## 2020-11-11 HISTORY — DX: Cardiomegaly: I51.7

## 2020-11-11 LAB — GLUCOSE, CAPILLARY
Glucose-Capillary: 184 mg/dL — ABNORMAL HIGH (ref 70–99)
Glucose-Capillary: 164 mg/dL — ABNORMAL HIGH (ref 70–99)

## 2020-11-11 SURGERY — ARTHROPLASTY, KNEE, TOTAL
Anesthesia: Spinal | Site: Knee | Laterality: Left

## 2020-11-11 MED ORDER — KETOROLAC TROMETHAMINE 15 MG/ML IJ SOLN: 15.0000 mg | Freq: Four times a day (QID) | INTRAMUSCULAR | Status: DC

## 2020-11-11 MED ORDER — GLYCOPYRROLATE 0.2 MG/ML IJ SOLN
INTRAMUSCULAR | Status: AC
  Filled 2020-11-11: qty 1

## 2020-11-11 MED ORDER — ACETAMINOPHEN 500 MG PO TABS
ORAL_TABLET | ORAL | Status: AC
  Administered 2020-11-11: 1000 mg via ORAL
  Filled 2020-11-11: qty 2

## 2020-11-11 MED ORDER — SODIUM CHLORIDE 0.9 % IV SOLN
INTRAVENOUS | Status: DC | PRN
  Administered 2020-11-11: 60 mL

## 2020-11-11 MED ORDER — CEFAZOLIN SODIUM-DEXTROSE 2-4 GM/100ML-% IV SOLN
2.0000 g | INTRAVENOUS | Status: AC
  Administered 2020-11-11: 2 g via INTRAVENOUS

## 2020-11-11 MED ORDER — TRAMADOL HCL 50 MG PO TABS: 50.0000 mg | ORAL_TABLET | Freq: Four times a day (QID) | ORAL | 0 refills | Status: DC | PRN

## 2020-11-11 MED ORDER — FENTANYL CITRATE (PF) 100 MCG/2ML IJ SOLN: 25.0000 ug | INTRAMUSCULAR | Status: DC | PRN

## 2020-11-11 MED ORDER — SODIUM CHLORIDE 0.9 % BOLUS PEDS
250.0000 mL | Freq: Once | INTRAVENOUS | Status: AC
  Administered 2020-11-11: 250 mL via INTRAVENOUS

## 2020-11-11 MED ORDER — MIDAZOLAM HCL 2 MG/2ML IJ SOLN
INTRAMUSCULAR | Status: AC
  Filled 2020-11-11: qty 2

## 2020-11-11 MED ORDER — CEFAZOLIN SODIUM-DEXTROSE 2-4 GM/100ML-% IV SOLN
INTRAVENOUS | Status: AC
  Filled 2020-11-11: qty 100

## 2020-11-11 MED ORDER — OXYCODONE HCL 5 MG PO TABS: 5.0000 mg | ORAL_TABLET | ORAL | Status: DC | PRN

## 2020-11-11 MED ORDER — BUPIVACAINE HCL (PF) 0.5 % IJ SOLN
INTRAMUSCULAR | Status: DC | PRN
  Administered 2020-11-11: 2.8 mL via INTRATHECAL

## 2020-11-11 MED ORDER — METOCLOPRAMIDE HCL 5 MG/ML IJ SOLN: 5.0000 mg | Freq: Three times a day (TID) | INTRAMUSCULAR | Status: DC | PRN

## 2020-11-11 MED ORDER — OXYCODONE HCL 5 MG PO TABS: 5.0000 mg | ORAL_TABLET | ORAL | 0 refills | Status: DC | PRN

## 2020-11-11 MED ORDER — CHLORHEXIDINE GLUCONATE 0.12 % MT SOLN
OROMUCOSAL | Status: AC
  Administered 2020-11-11: 15 mL via OROMUCOSAL
  Filled 2020-11-11: qty 15

## 2020-11-11 MED ORDER — ONDANSETRON HCL 4 MG/2ML IJ SOLN
INTRAMUSCULAR | Status: AC
  Filled 2020-11-11: qty 2

## 2020-11-11 MED ORDER — LIDOCAINE HCL URETHRAL/MUCOSAL 2 % EX GEL
CUTANEOUS | Status: AC
  Filled 2020-11-11: qty 5

## 2020-11-11 MED ORDER — BUPIVACAINE HCL (PF) 0.5 % IJ SOLN
INTRAMUSCULAR | Status: AC
  Filled 2020-11-11: qty 10

## 2020-11-11 MED ORDER — SODIUM CHLORIDE 0.9 % IR SOLN
Status: DC | PRN
  Administered 2020-11-11: 3000 mL

## 2020-11-11 MED ORDER — TRANEXAMIC ACID 1000 MG/10ML IV SOLN
INTRAVENOUS | Status: DC | PRN
  Administered 2020-11-11: 1000 mg via TOPICAL

## 2020-11-11 MED ORDER — CEFAZOLIN SODIUM-DEXTROSE 2-4 GM/100ML-% IV SOLN
INTRAVENOUS | Status: AC
  Administered 2020-11-11: 2 g via INTRAVENOUS
  Filled 2020-11-11: qty 100

## 2020-11-11 MED ORDER — OXYCODONE HCL 5 MG PO TABS
ORAL_TABLET | ORAL | Status: AC
  Administered 2020-11-11: 5 mg via ORAL
  Filled 2020-11-11: qty 1

## 2020-11-11 MED ORDER — LIDOCAINE HCL (PF) 2 % IJ SOLN
INTRAMUSCULAR | Status: AC
  Filled 2020-11-11: qty 5

## 2020-11-11 MED ORDER — KETOROLAC TROMETHAMINE 30 MG/ML IJ SOLN
INTRAMUSCULAR | Status: AC
  Filled 2020-11-11: qty 1

## 2020-11-11 MED ORDER — GLYCOPYRROLATE 0.2 MG/ML IJ SOLN
INTRAMUSCULAR | Status: DC | PRN
  Administered 2020-11-11: .2 mg via INTRAVENOUS

## 2020-11-11 MED ORDER — METOCLOPRAMIDE HCL 10 MG PO TABS: 5.0000 mg | ORAL_TABLET | Freq: Three times a day (TID) | ORAL | Status: DC | PRN

## 2020-11-11 MED ORDER — ONDANSETRON HCL 4 MG PO TABS: 4.0000 mg | ORAL_TABLET | Freq: Four times a day (QID) | ORAL | Status: DC | PRN

## 2020-11-11 MED ORDER — ORAL CARE MOUTH RINSE: 15.0000 mL | Freq: Once | OROMUCOSAL | Status: AC

## 2020-11-11 MED ORDER — BUPIVACAINE-EPINEPHRINE (PF) 0.5% -1:200000 IJ SOLN
INTRAMUSCULAR | Status: AC
  Filled 2020-11-11: qty 30

## 2020-11-11 MED ORDER — DEXAMETHASONE SODIUM PHOSPHATE 10 MG/ML IJ SOLN
INTRAMUSCULAR | Status: AC
  Filled 2020-11-11: qty 1

## 2020-11-11 MED ORDER — PROPOFOL 500 MG/50ML IV EMUL
INTRAVENOUS | Status: DC | PRN
  Administered 2020-11-11: 50 ug/kg/min via INTRAVENOUS

## 2020-11-11 MED ORDER — BUPIVACAINE-EPINEPHRINE (PF) 0.5% -1:200000 IJ SOLN
INTRAMUSCULAR | Status: DC | PRN
  Administered 2020-11-11: 30 mL

## 2020-11-11 MED ORDER — APIXABAN 2.5 MG PO TABS
2.5000 mg | ORAL_TABLET | Freq: Two times a day (BID) | ORAL | 0 refills | Status: DC
Start: 2020-11-12 — End: 2021-01-17

## 2020-11-11 MED ORDER — TRANEXAMIC ACID 1000 MG/10ML IV SOLN
INTRAVENOUS | Status: AC
  Filled 2020-11-11: qty 10

## 2020-11-11 MED ORDER — SODIUM CHLORIDE 0.9 % IV SOLN: INTRAVENOUS | Status: DC

## 2020-11-11 MED ORDER — 0.9 % SODIUM CHLORIDE (POUR BTL) OPTIME
TOPICAL | Status: DC | PRN
  Administered 2020-11-11: 100 mL

## 2020-11-11 MED ORDER — SODIUM CHLORIDE FLUSH 0.9 % IV SOLN
INTRAVENOUS | Status: AC
  Filled 2020-11-11: qty 40

## 2020-11-11 MED ORDER — BUPIVACAINE LIPOSOME 1.3 % IJ SUSP
INTRAMUSCULAR | Status: AC
  Filled 2020-11-11: qty 20

## 2020-11-11 MED ORDER — ONDANSETRON HCL 4 MG/2ML IJ SOLN: 4.0000 mg | Freq: Four times a day (QID) | INTRAMUSCULAR | Status: DC | PRN

## 2020-11-11 MED ORDER — CHLORHEXIDINE GLUCONATE 0.12 % MT SOLN: 15.0000 mL | Freq: Once | OROMUCOSAL | Status: AC

## 2020-11-11 MED ORDER — DEXMEDETOMIDINE (PRECEDEX) IN NS 20 MCG/5ML (4 MCG/ML) IV SYRINGE
PREFILLED_SYRINGE | INTRAVENOUS | Status: DC | PRN
  Administered 2020-11-11: 12 ug via INTRAVENOUS
  Administered 2020-11-11: 8 ug via INTRAVENOUS

## 2020-11-11 MED ORDER — FENTANYL CITRATE (PF) 100 MCG/2ML IJ SOLN
INTRAMUSCULAR | Status: AC
  Filled 2020-11-11: qty 2

## 2020-11-11 MED ORDER — ACETAMINOPHEN 500 MG PO TABS: 1000.0000 mg | ORAL_TABLET | Freq: Four times a day (QID) | ORAL | Status: DC

## 2020-11-11 MED ORDER — KETOROLAC TROMETHAMINE 30 MG/ML IJ SOLN
30.0000 mg | Freq: Once | INTRAMUSCULAR | Status: AC
  Administered 2020-11-11: 30 mg via INTRAVENOUS

## 2020-11-11 MED ORDER — FAMOTIDINE 20 MG PO TABS
ORAL_TABLET | ORAL | Status: AC
  Administered 2020-11-11: 20 mg
  Filled 2020-11-11: qty 1

## 2020-11-11 MED ORDER — DEXMEDETOMIDINE (PRECEDEX) IN NS 20 MCG/5ML (4 MCG/ML) IV SYRINGE
PREFILLED_SYRINGE | INTRAVENOUS | Status: AC
  Filled 2020-11-11: qty 5

## 2020-11-11 MED ORDER — FENTANYL CITRATE (PF) 100 MCG/2ML IJ SOLN
INTRAMUSCULAR | Status: DC | PRN
  Administered 2020-11-11 (×2): 50 ug via INTRAVENOUS

## 2020-11-11 MED ORDER — MIDAZOLAM HCL 5 MG/5ML IJ SOLN
INTRAMUSCULAR | Status: DC | PRN
  Administered 2020-11-11: 2 mg via INTRAVENOUS

## 2020-11-11 MED ORDER — PROPOFOL 1000 MG/100ML IV EMUL
INTRAVENOUS | Status: AC
  Filled 2020-11-11: qty 100

## 2020-11-11 MED ORDER — CEFAZOLIN SODIUM-DEXTROSE 2-4 GM/100ML-% IV SOLN
2.0000 g | Freq: Four times a day (QID) | INTRAVENOUS | Status: DC
Start: 2020-11-11 — End: 2020-11-11

## 2020-11-11 SURGICAL SUPPLY — 61 items
BIT DRILL QUICK REL 1/8 2PK SL (DRILL) ×1 IMPLANT
BLADE SAW SAG 25X90X1.19 (BLADE) ×2 IMPLANT
BLADE SURG SZ20 CARB STEEL (BLADE) ×2 IMPLANT
BNDG ELASTIC 6X5.8 VLCR NS LF (GAUZE/BANDAGES/DRESSINGS) ×2 IMPLANT
CEMENT BONE R 1X40 (Cement) ×4 IMPLANT
CEMENT VACUUM MIXING SYSTEM (MISCELLANEOUS) ×2 IMPLANT
CHLORAPREP W/TINT 26 (MISCELLANEOUS) ×2 IMPLANT
COOLER POLAR GLACIER W/PUMP (MISCELLANEOUS) ×2 IMPLANT
COVER MAYO STAND REUSABLE (DRAPES) ×2 IMPLANT
CUFF TOURN SGL QUICK 24 (TOURNIQUET CUFF)
CUFF TOURN SGL QUICK 34 (TOURNIQUET CUFF)
CUFF TRNQT CYL 24X4X16.5-23 (TOURNIQUET CUFF) IMPLANT
CUFF TRNQT CYL 34X4.125X (TOURNIQUET CUFF) IMPLANT
DRAPE 3/4 80X56 (DRAPES) ×2 IMPLANT
DRAPE IMP U-DRAPE 54X76 (DRAPES) ×2 IMPLANT
DRAPE INCISE IOBAN 66X45 STRL (DRAPES) ×2 IMPLANT
DRILL QUICK RELEASE 1/8 INCH (DRILL) ×1
DRSG MEPILEX SACRM 8.7X9.8 (GAUZE/BANDAGES/DRESSINGS) ×2 IMPLANT
DRSG OPSITE POSTOP 4X10 (GAUZE/BANDAGES/DRESSINGS) ×2 IMPLANT
DRSG OPSITE POSTOP 4X8 (GAUZE/BANDAGES/DRESSINGS) IMPLANT
ELECT REM PT RETURN 9FT ADLT (ELECTROSURGICAL) ×2
ELECTRODE REM PT RTRN 9FT ADLT (ELECTROSURGICAL) ×1 IMPLANT
FEMORAL CR LEFT 75 (Joint) ×2 IMPLANT
GAUZE 4X4 16PLY ~~LOC~~+RFID DBL (SPONGE) ×2 IMPLANT
GAUZE XEROFORM 1X8 LF (GAUZE/BANDAGES/DRESSINGS) ×2 IMPLANT
GLOVE SRG 8 PF TXTR STRL LF DI (GLOVE) ×1 IMPLANT
GLOVE SURG ENC MOIS LTX SZ7.5 (GLOVE) ×8 IMPLANT
GLOVE SURG ENC MOIS LTX SZ8 (GLOVE) ×8 IMPLANT
GLOVE SURG UNDER LTX SZ8 (GLOVE) ×2 IMPLANT
GLOVE SURG UNDER POLY LF SZ8 (GLOVE) ×1
GOWN STRL REUS W/ TWL LRG LVL3 (GOWN DISPOSABLE) ×1 IMPLANT
GOWN STRL REUS W/ TWL XL LVL3 (GOWN DISPOSABLE) ×1 IMPLANT
GOWN STRL REUS W/TWL LRG LVL3 (GOWN DISPOSABLE) ×1
GOWN STRL REUS W/TWL XL LVL3 (GOWN DISPOSABLE) ×1
IMPL PATELLA POST 37X8.5 (Miscellaneous) ×1 IMPLANT
IMPLANT PATELLA POST 37X8.5 (Miscellaneous) ×2 IMPLANT
INSERT TIB BEARING 78X12 (Insert) ×2 IMPLANT
IV NS IRRIG 3000ML ARTHROMATIC (IV SOLUTION) ×2 IMPLANT
KIT TURNOVER KIT A (KITS) ×2 IMPLANT
MANIFOLD NEPTUNE II (INSTRUMENTS) ×2 IMPLANT
NEEDLE SPNL 20GX3.5 QUINCKE YW (NEEDLE) ×2 IMPLANT
NS IRRIG 1000ML POUR BTL (IV SOLUTION) ×2 IMPLANT
PACK TOTAL KNEE (MISCELLANEOUS) ×2 IMPLANT
PAD WRAPON POLAR KNEE (MISCELLANEOUS) ×1 IMPLANT
PENCIL SMOKE EVACUATOR (MISCELLANEOUS) ×2 IMPLANT
PLATE KNEE TIBIAL 79MM FIXED (Plate) ×2 IMPLANT
PULSAVAC PLUS IRRIG FAN TIP (DISPOSABLE) ×2
SPONGE T-LAP 18X18 ~~LOC~~+RFID (SPONGE) ×6 IMPLANT
STAPLER SKIN PROX 35W (STAPLE) ×2 IMPLANT
SUCTION FRAZIER HANDLE 10FR (MISCELLANEOUS) ×1
SUCTION TUBE FRAZIER 10FR DISP (MISCELLANEOUS) ×1 IMPLANT
SUT VIC AB 0 CT1 36 (SUTURE) ×6 IMPLANT
SUT VIC AB 2-0 CT1 27 (SUTURE) ×4
SUT VIC AB 2-0 CT1 TAPERPNT 27 (SUTURE) ×4 IMPLANT
SYR 10ML LL (SYRINGE) ×2 IMPLANT
SYR 20ML LL LF (SYRINGE) ×2 IMPLANT
SYR 30ML LL (SYRINGE) IMPLANT
TIP FAN IRRIG PULSAVAC PLUS (DISPOSABLE) ×1 IMPLANT
TRAP FLUID SMOKE EVACUATOR (MISCELLANEOUS) ×2 IMPLANT
WATER STERILE IRR 500ML POUR (IV SOLUTION) ×2 IMPLANT
WRAPON POLAR PAD KNEE (MISCELLANEOUS) ×2

## 2020-11-11 NOTE — Discharge Instructions (Addendum)
Orthopedic discharge instructions: May shower with intact Op-Site dressing.  Apply ice frequently to knee or use Polar Care. Start Eliquis 2.5 mg BID for 2 weeks as of Friday, 11/12/20, then take aspirin 325 mg daily for 4 weeks. Take oxycodone or Tramadol as prescribed when needed.  May supplement with ES Tylenol if necessary. May weight-bear as tolerated on left leg - use walker for balance and support. Follow-up in 10-14 days or as scheduled.AMBULATORY SURGERY  DISCHARGE INSTRUCTIONS   The drugs that you were given will stay in your system until tomorrow so for the next 24 hours you should not:  Drive an automobile Make any legal decisions Drink any alcoholic beverage   You may resume regular meals tomorrow.  Today it is better to start with liquids and gradually work up to solid foods.  You may eat anything you prefer, but it is better to start with liquids, then soup and crackers, and gradually work up to solid foods.   Please notify your doctor immediately if you have any unusual bleeding, trouble breathing, redness and pain at the surgery site, drainage, fever, or pain not relieved by medication.     Your post-operative visit with Dr.                                       is: Date:                        Time:    Please call to schedule your post-operative visit.  Additional Instructions:

## 2020-11-11 NOTE — Transfer of Care (Signed)
Immediate Anesthesia Transfer of Care Note  Patient: Raymond [name]  Procedure(s) Performed: TOTAL KNEE ARTHROPLASTY (Left: Knee)  Patient Location: PACU  Anesthesia Type:Spinal  Level of Consciousness: awake, alert  and oriented  Airway & Oxygen Therapy: Patient Spontanous Breathing  Post-op Assessment: Report given to RN and Post -op Vital signs reviewed and stable  Post vital signs: Reviewed  Last Vitals:  Vitals Value Taken Time  BP 83/53 11/11/20 1016  Temp    Pulse 59 11/11/20 1017  Resp 10 11/11/20 1017  SpO2 91 % 11/11/20 1017  Vitals shown include unvalidated device data.  Last Pain:  Vitals:   11/11/20 0638  TempSrc: Temporal  PainSc: 4          Complications: No notable events documented.

## 2020-11-11 NOTE — Anesthesia Procedure Notes (Signed)
Spinal  Patient location during procedure: OR Reason for block: surgical anesthesia Staffing Performed: resident/CRNA  Anesthesiologist: Martha Clan, MD Resident/CRNA: Rolla Plate, CRNA Preanesthetic Checklist Completed: patient identified, IV checked, site marked, risks and benefits discussed, surgical consent, monitors and equipment checked, pre-op evaluation and timeout performed Spinal Block Patient position: sitting Prep: ChloraPrep and site prepped and draped Patient monitoring: heart rate, continuous pulse ox, blood pressure and cardiac monitor Approach: midline Location: L4-5 Injection technique: single-shot Needle Needle type: [place]acre and Introducer  Needle gauge: 24 G Needle length: 9 cm Assessment Events: CSF return Additional Notes Negative paresthesia. Negative blood return. Positive free-flowing CSF. Expiration date of kit checked and confirmed. Patient tolerated procedure well, without complications.

## 2020-11-11 NOTE — Op Note (Signed)
11/11/2020  10:21 AM  Patient:   Raymond [name]  Pre-Op Diagnosis:   Degenerative joint disease, left knee.  Post-Op Diagnosis:   Same  Procedure:   Left TKA using all-cemented [employer] Vanguard system with a 75 mm PCR femur, a 79 mm tibial tray with a 12 mm anterior stabilized E-poly insert, and a 37 x 8.6 mm all-poly 3-pegged domed patella.  Surgeon:   Pascal Lux, MD  Assistant:   Cameron Proud, PA-C; Rennis Chris, PA-S  Anesthesia:   Spinal  Findings:   As above  Complications:   None  EBL:   20 cc  Fluids:   750 cc crystalloid  UOP:   None  TT:   105 minutes at 300 mmHg  Drains:   None  Closure:   Staples  Implants:   As above  Brief Clinical Note:   The patient is a 65 year old male with a long history of progressively worsening left knee pain. The patient's symptoms have progressed despite medications, activity modification, injections, etc. The patient's history and examination were consistent with advanced degenerative joint disease of the left knee confirmed by plain radiographs. The patient presents at this time for a left total knee arthroplasty.  Procedure:   The patient was brought into the operating room. After adequate spinal anesthesia was obtained, the patient was lain in the supine position before the left lower extremity was prepped with ChloraPrep solution and draped sterilely. Preoperative antibiotics were administered. After verifying the proper laterality with a surgical timeout, the limb was exsanguinated with an Esmarch and the tourniquet inflated to 300 mmHg.   A standard anterior approach to the knee was made through an approximately 7 inch incision. The incision was carried down through the subcutaneous tissues to expose superficial retinaculum. This was split the length of the incision and the medial flap elevated sufficiently to expose the medial retinaculum. The medial retinaculum was incised, leaving a 3-4 mm cuff of tissue on the patella. This  was extended distally along the medial border of the patellar tendon and proximally through the medial third of the quadriceps tendon. A subtotal fat pad excision was performed before the soft tissues were elevated off the anteromedial and anterolateral aspects of the proximal tibia to the level of the collateral ligaments. The anterior portions of the medial and lateral menisci were removed, as was the anterior cruciate ligament. With the knee flexed to 90, the external tibial guide was positioned and the appropriate proximal tibial cut made. This piece was taken to the back table where it was measured and found to be optimally replicated by a 79 mm component.  Attention was directed to the distal femur. The intramedullary canal was accessed through a 3/8" drill hole. The intramedullary guide was inserted and positioned in order to obtain a neutral flexion gap. The intercondylar block was positioned with care taken to avoid notching the anterior cortex of the femur. The appropriate cut was made. Next, the distal cutting block was placed at 5 of valgus alignment. Using the 9 mm slot, the distal cut was made. The distal femur was measured and found to be optimally replicated by the 75 mm component. The 75 mm 4-in-1 cutting block was positioned and first the posterior, then the posterior chamfer, the anterior chamfer, and finally the anterior cuts were made. At this point, the posterior portions medial and lateral menisci were removed. A trial reduction was performed using the appropriate femoral and tibial components with first the 10 mm and  then the 12 mm insert. The 12 mm insert demonstrated excellent stability to varus and valgus stressing both in flexion and extension while permitting full extension. Patella tracking was assessed and found to be excellent. Therefore, the tibial guide position was marked on the proximal tibia. The patella thickness was measured and found to be 23 mm. Therefore, the appropriate  cut was made. The patellar surface was measured and found to be optimally replicated by the 37 mm component. The three peg holes were drilled in place before the trial button was inserted. Patella tracking was assessed and found to be excellent, passing the "no thumb test". The lug holes were drilled into the distal femur before the trial component was removed, leaving only the tibial tray. The keel was then created using the appropriate tower, reamer, and punch.  The bony surfaces were prepared for cementing by irrigating them thoroughly with sterile saline solution via the jet lavage system. A bone plug was fashioned from some of the bone that had been removed previously and used to plug the distal femoral canal. In addition, 20 cc of Exparel diluted out to 60 cc with normal saline and 30 cc of 0.5% Sensorcaine were injected into the postero-medial and postero-lateral aspects of the knee, the medial and lateral gutter regions, and the peri-incisional tissues to help with postoperative analgesia. Meanwhile, the cement was being mixed on the back table. When it was ready, the tibial tray was cemented in first. The excess cement was removed using [employer] fast streamer. Next, the femoral component was impacted into place. Again, the excess cement was removed using [employer] fast streamer. The 12 mm trial insert was positioned and the knee brought into extension while the cement hardened. Finally, the patella was cemented into place and secured using the patellar clamp. Again, the excess cement was removed using [employer] fast streamer. Once the cement had hardened, the knee was placed through a range of motion with the findings as described above. Therefore, the trial insert was removed and, after verifying that no cement had been retained posteriorly, the permanent 12 mm anterior stabilized E-polyethylene insert was positioned and secured using the appropriate key locking mechanism. Again the knee was placed through a range of motion  with the findings as described above.  The wound was copiously irrigated with sterile saline solution using the jet lavage system before the quadriceps tendon and retinacular layer were reapproximated using #0 Vicryl interrupted sutures. The superficial retinacular layer also was closed using a running #0 Vicryl suture. A total of 10 cc of transexemic acid (TXA) was injected intra-articularly before the subcutaneous tissues were closed in several layers using 2-0 Vicryl interrupted sutures. The skin was closed using staples. A sterile honeycomb dressing was applied to the skin before the leg was wrapped with an Ace wrap to accommodate the Polar Care device. The patient was then awakened and returned to the recovery room in satisfactory condition after tolerating the procedure well.

## 2020-11-11 NOTE — H&P (Signed)
History of Present Illness:  Raymond [name] is a 65 y.o. male who presents for follow-up of his left knee pain secondary to advanced degenerative joint disease. The patient has not been formally evaluated for these symptoms, although he was known to have degenerative joint disease of his left knee as these findings were noted incidentally on previous x-rays of his right knee. He is now nearly 10 months status post a right total knee arthroplasty from which he has done quite well. The patient notes that his left knee symptoms have worsened over the past several months. He localizes the pain to the medial aspect of his knee. His symptoms are aggravated by any prolonged standing or ambulation, as well as with ascending/descending stairs. He is having difficulty reciprocating stairs and is also noticing some discomfort at night. He has been taking Zanaflex and tramadol with limited benefit. He also had been taking meloxicam on a daily basis all, also without any significant benefit. He has undergone steroid injections and viscosupplementation injections in the past with temporary partial relief of his symptoms. The patient is quite frustrated by his symptoms and functional limitation, and is ready to consider more aggressive treatment options at this time.  Current Outpatient Medications:  amLODIPine (NORVASC) 10 MG tablet Take 10 mg by mouth once daily.   amoxicillin (AMOXIL) 500 MG capsule TAKE 4 CAPSULES BY MOUTH 1 HOUR PRIOR TO DENTAL TREATMENT   aspirin 81 MG EC tablet Take 81 mg by mouth once daily.   buPROPion (WELLBUTRIN) 75 MG tablet Take 75 mg by mouth 2 (two) times daily.   gabapentin (NEURONTIN) 300 MG capsule Take 3 capsules (900 mg total) by mouth 3 (three) times daily 810 capsule 3   glipiZIDE (GLUCOTROL) 5 MG tablet Take two tablets (10 mg total) each morning and one tablet (5 mg total) each evening with meals 90 tablet 5   losartan (COZAAR) 100 MG tablet TAKE 1 TABLET BY MOUTH EVERY DAY 90  tablet 3   meclizine (ANTIVERT) 25 mg tablet TAKE 1/2 TO 1 TABLET BY MOUTH EVERY 8 HOURS AS NEEDED FOR DIZZINESS   metFORMIN (GLUCOPHAGE) 500 MG tablet Take two 500 mg tablets (1000 mg total) by mouth twice daily, with meals. 120 tablet 5   pantoprazole (PROTONIX) 40 MG DR tablet Take 40 mg by mouth once daily   promethazine (PHENERGAN) 25 MG tablet TAKE 1 TABLET BY MOUTH EVERY 8 HOURS AS NEEDED FOR NAUSEA   rosuvastatin (CRESTOR) 20 MG tablet Take 1 tablet (20 mg total) by mouth once daily 90 tablet 4   semaglutide (OZEMPIC) 0.25 mg or 0.5 mg(2 mg/1.5 mL) pen injector Inject 0.25 mg subcutaneously for 4 weeks, then increase to 0.5 mg weekly 1.5 mL 0   sertraline (ZOLOFT) 50 MG tablet Take 50 mg by mouth once daily   tiZANidine (ZANAFLEX) 2 MG tablet TAKE 1 TABLET (2 MG TOTAL) BY MOUTH NIGHTLY AS NEEDED 30 tablet 1   traMADoL (ULTRAM) 50 mg tablet Take 1 tablet (50 mg total) by mouth every 6 (six) hours as needed for Pain for up to 30 doses 30 tablet 0   triamcinolone 0.1 % ointment 1(ONE) APPLICATION(S) TOPICAL 2(TWO) TIMES A DAY   triamterene-hydrochlorothiazide (DYAZIDE) 37.5-25 mg capsule Take 1 capsule by mouth once daily   Allergies: No Known Allergies  Past Medical History:   Arthrosis of knee 08/14/2013   Benign essential HTN 07/26/2016   Bicipital tendinitis of left shoulder 07/21/2014   Chickenpox   Depression   Diabetes  mellitus type 2, uncomplicated (CMS-HCC)   GERD (gastroesophageal reflux disease) 00/00/0000   Hyperlipidemia, mixed 07/26/2016   Hypertension   Impingement syndrome of left shoulder 07/21/2014   Lumbar radiculitis 08/14/2013   Primary osteoarthritis of left hip 11/03/2015   Primary osteoarthritis of right knee 01/24/2017   Sleep apnea   Stable angina pectoris (CMS-HCC) 07/26/2016   Status post total hip replacement, left 01/26/2016   Uncontrolled type 2 diabetes mellitus without complication, without long-term current use of insulin 09/12/2016   Past Surgical History:    Right TKA Right 12/23/2019 (Dr. Roland Rack)   Arthroscopic debridement of chondral lesions left shoulder along with arthroscopic removal of loose body and arthroscopic release of the long head of the biceps tendon Left 08/10/2014 (Dr Jefm Bryant)   Arthroscopic partial medial menscectomy, arthroscopic debridement, and arthroscopic abrasion chondroplasty of medial femoral condyle, lateral tibial plateau, and femoral trochlea,left knee Left 07/25/2017 (Dr Roland Rack)   [place] 12/22/2018 (Dr Roland Rack)  [place]   Endoscopic right carpal tunnel release. Right 01/02/2019 (Dr Roland Rack)   ENDOVENOUS ABLATION SAPHENOUS VEIN W/ LASER   JOINT REPLACEMENT (Left Total hip)   KNEE ARTHROSCOPY Right 10/05/2009  DIAGNOSTIC AND OPERATIVE ARTHROSCOPY WITH PARTIAL MEDIAL AND LATERAL MENISECTOMY, DR.CALIFF---   KNEE CHONDROPLASTIC SHAVING PATELLOFEMORAL JOINT 10/05/2009   left shoulder Left 08/10/2014   Family History:   Myocardial Infarction (Heart attack) Mother   High blood pressure (Hypertension) Mother   Clotting disorder Mother   Coronary Artery Disease (Blocked arteries around heart) Mother   Dementia Mother   Stroke Mother   High blood pressure (Hypertension) Father   Dementia Father   Myocardial Infarction (Heart attack) Brother   No Known Problems Maternal Grandmother   No Known Problems Maternal Grandfather   No Known Problems Paternal Grandmother   Diabetes Paternal Grandfather   Alcohol abuse Brother   No Known Problems Daughter   Asthma Son   Social History:   Socioeconomic History:   Marital status: Married   Number of children: 2  Occupational History   Occupation: Garment/textile technologist  Tobacco Use   Smoking status: Never Smoker   Smokeless tobacco: Never Used  Scientific laboratory technician Use: Never used  Substance and Sexual Activity   Alcohol use: Not Currently  Alcohol/week: 0.0 standard drinks  Comment: once in a while   Drug use: Never   Sexual activity: Yes  Partners: Female   Birth control/protection: Surgical, None   Review of Systems:  A comprehensive 14 point ROS was performed, reviewed, and the pertinent orthopaedic findings are documented in the HPI.  Physical Exam: Vitals:  10/06/20 1050  BP: 124/86  Weight: (!) 122 kg (269 lb)  Height: 185.4 cm (6\' 1" )  PainSc: 2  PainLoc: Knee   General/Constitutional: The patient appears to be well-nourished, well-developed, and in no acute distress. Neuro/Psych: Normal mood and affect, oriented to person, place and time. Eyes: Non-icteric. Pupils are equal, round, and reactive to light, and exhibit synchronous movement. ENT: Unremarkable. Lymphatic: No palpable adenopathy. Respiratory: Lungs clear to auscultation, Normal chest excursion, No wheezes and Non-labored breathing Cardiovascular: Regular rate and rhythm. No murmurs. and No edema, swelling or tenderness, except as noted in detailed exam. Integumentary: No impressive skin lesions present, except as noted in detailed exam. Musculoskeletal: Unremarkable, except as noted in detailed exam.  Left knee exam: GAIT: Minimal limp, favoring his left leg, and uses no assistive devices. ALIGNMENT: mild varus SKIN: unremarkable SWELLING: minimal EFFUSION: trace WARMTH: no warmth TENDERNESS: mild over the  medial joint line ROM: 10 to 110 degrees actively and 5 to 115 degrees passively with mild discomfort in maximal flexion McMURRAY'S: equivocal PATELLOFEMORAL: normal tracking with no peri-patellar tenderness and negative apprehension sign CREPITUS: no LACHMAN'S: negative PIVOT SHIFT: negative ANTERIOR DRAWER: negative POSTERIOR DRAWER: negative VARUS/VALGUS: positive pseudolaxity to varus stressing  He is neurovascularly intact to the left lower extremity and foot.  X-rays/MRI/Lab data:  Standing AP and lateral x-rays of the left knee, as well as a sunrise view, are obtained. These films demonstrate severe degenerative changes, primarily involving the  medial compartment with 100% medial joint space narrowing. Lesser degenerative changes of the patellofemoral more so than the lateral compartments also are noted. Overall alignment is moderate varus. No fractures, lytic lesions, or abnormal calcifications are noted.  Assessment:  Primary osteoarthritis of left knee   Plan: The treatment options were discussed with the patient. In addition, patient educational materials were provided regarding the diagnosis and treatment options. The patient is quite frustrated by his left knee symptoms and is ready to consider more aggressive treatment options. Given how well he has responded to his right total knee arthroplasty, I have recommended that we proceed with a left total knee arthroplasty. The procedure was discussed with the patient, as were the potential risks (including bleeding, infection, nerve and/or blood vessel injury, persistent or recurrent pain, loosening and/or failure of the components, dislocation, need for further surgery, blood clots, strokes, heart attacks and/or arhythmias, pneumonia, etc.) and benefits. The patient states his understanding and wishes to proceed. He may continue his normal daily activities and home exercises, but is to avoid offending activities. He may continue his present medication regimen. All of the patient's questions and concerns were answered. He can call any time with further concerns. He will follow up post-surgery, routine.   H&P reviewed and patient re-examined. No changes.

## 2020-11-11 NOTE — Progress Notes (Signed)
Bladder scanned for 269 prior to voiding of 145mls. Per Dr Roland Rack give him a little more time. No bladder distention noted and no c/o bladder discomfort. Has been up with physical therapy. Spinal completely resolved. Ambulating with walker

## 2020-11-11 NOTE — Evaluation (Signed)
Physical Therapy Evaluation Patient Details Name: Raymond [name] MRN: [id number] DOB: 02-Mar-1955 Today's Date: 11/11/2020  History of Present Illness  seen in post op s/p L TKR, WBAT 11/11/20  Clinical Impression  Patient received on stretcher. Reports he is feeling good. He performed bed mobility independently. Transfers with supervision. Ambulated 100 feet with RW and up/down steps with min guard. Reviewed HEP and patient return demo. He is all set to go home. Wife present in room. Patient planning to d/c today and have HHPT starting Sunday.          Recommendations for follow up therapy are one component of a multi-disciplinary discharge planning process, led by the attending physician.  Recommendations may be updated based on patient status, additional functional criteria and insurance authorization.  Follow Up Recommendations Home health PT    Equipment Recommendations  None recommended by PT    Recommendations for Other Services       Precautions / Restrictions Precautions Precautions: Fall Precaution Comments: mod fall Restrictions Weight Bearing Restrictions: No      Mobility  Bed Mobility Overal bed mobility: Independent                  Transfers Overall transfer level: Modified independent Equipment used: Rolling walker (2 wheeled)             General transfer comment: stood with supervision  Ambulation/Gait Ambulation/Gait assistance: Supervision Gait Distance (Feet): 100 Feet   Gait Pattern/deviations: Step-to pattern Gait velocity: decr   General Gait Details: generally safe with mobility.  Stairs Stairs: Yes Stairs assistance: Min guard Stair Management: One rail Left;Two rails;Step to pattern;Sideways;Forwards Number of Stairs: 4 General stair comments: patient is safe on steps  Wheelchair Mobility    Modified Rankin (Stroke Patients Only)       Balance Overall balance assessment: Modified Independent                                            Pertinent Vitals/Pain Pain Assessment: No/denies pain    Home Living Family/patient expects to be discharged to:: Private residence Living Arrangements: Spouse/significant other Available Help at Discharge: Family;Available 24 hours/day Type of Home: House Home Access: Stairs to enter Entrance Stairs-Rails: None Entrance Stairs-Number of Steps: 3 Home Layout: Two level;Bed/bath upstairs;Able to live on main level with bedroom/bathroom Home Equipment: Gilford Rile - 2 wheels Additional Comments: Plans to stay on main level of home immediately upon discharge    Prior Function Level of Independence: Independent         Comments: Indep with ADLs, household and community mobilization without assist device; denies fall history.     Hand Dominance        Extremity/Trunk Assessment   Upper Extremity Assessment Upper Extremity Assessment: Overall WFL for tasks assessed    Lower Extremity Assessment Lower Extremity Assessment: Overall WFL for tasks assessed    Cervical / Trunk Assessment Cervical / Trunk Assessment: Normal  Communication   Communication: No difficulties  Cognition Arousal/Alertness: Awake/alert Behavior During Therapy: WFL for tasks assessed/performed Overall Cognitive Status: Within Functional Limits for tasks assessed                                        General Comments      Exercises Total Joint Exercises  Ankle Circles/Pumps: AROM;Both;5 reps Quad Sets: AROM;Left;5 reps Heel Slides: AROM;Left;5 reps Hip ABduction/ADduction: AROM;Left;5 reps Straight Leg Raises: AROM;Left;5 reps Goniometric ROM: 3-90   Assessment/Plan    PT Assessment Patient needs continued PT services  PT Problem List Decreased strength;Decreased mobility;Decreased activity tolerance;Decreased balance       PT Treatment Interventions DME instruction;Therapeutic activities;Gait training;Therapeutic exercise;Stair  training;Functional mobility training;Balance training;Patient/family education    PT Goals (Current goals can be found in the Care Plan section)  Acute Rehab PT Goals Patient Stated Goal: to go home PT Goal Formulation: With patient Time For Goal Achievement: 11/13/20 Potential to Achieve Goals: Good    Frequency BID   Barriers to discharge        Co-evaluation               AM-PAC PT "6 Clicks" Mobility  Outcome Measure Help needed turning from your back to your side while in a flat bed without using bedrails?: None Help needed moving from lying on your back to sitting on the side of a flat bed without using bedrails?: None Help needed moving to and from a bed to a chair (including a wheelchair)?: A Little Help needed standing up from a chair using your arms (e.g., wheelchair or bedside chair)?: None Help needed to walk in hospital room?: A Little Help needed climbing 3-5 steps with a railing? : A Little 6 Click Score: 21    End of Session Equipment Utilized During Treatment: Gait belt Activity Tolerance: Patient tolerated treatment well Patient left: in bed;with call bell/phone within reach;with family/visitor present Nurse Communication: Mobility status PT Visit Diagnosis: Difficulty in walking, not elsewhere classified (R26.2)    Time: 7943-2761 PT Time Calculation (min) (ACUTE ONLY): 35 min   Charges:   PT Evaluation $PT Eval Moderate Complexity: 1 Mod PT Treatments $Gait Training: 8-22 mins        Dannelle Rhymes, PT, GCS 11/11/20,1:48 PM

## 2020-11-11 NOTE — Anesthesia Preprocedure Evaluation (Signed)
Anesthesia Evaluation  Patient identified by MRN, date of birth, ID band Patient awake    Reviewed: Allergy & Precautions, NPO status , Patient's Chart, lab work & pertinent test results  History of Anesthesia Complications Negative for: history of anesthetic complications  Airway Mallampati: III  TM Distance: >3 FB Neck ROM: Full    Dental  (+) Poor Dentition, Dental Advidsory Given   Pulmonary neg shortness of breath, asthma , sleep apnea and Continuous Positive Airway Pressure Ventilation , neg recent URI,    breath sounds clear to auscultation- rhonchi (-) wheezing      Cardiovascular hypertension, Pt. on medications (-) angina(-) CAD, (-) Past MI, (-) Cardiac Stents and (-) CABG  Rhythm:Regular Rate:Normal - Systolic murmurs and - Diastolic murmurs    Neuro/Psych neg Seizures PSYCHIATRIC DISORDERS Depression  Neuromuscular disease CVA, No Residual Symptoms    GI/Hepatic Neg liver ROS, GERD  ,  Endo/Other  diabetes, Oral Hypoglycemic Agents  Renal/GU negative Renal ROS     Musculoskeletal  (+) Arthritis ,   Abdominal (+) + obese,   Peds  Hematology negative hematology ROS (+)   Anesthesia Other Findings Past Medical History: No date: Allergy No date: Arthritis No date: Asthma     Comment:  mild No date: Carotid stenosis No date: Carpal tunnel syndrome     Comment:  bilateral No date: Depression No date: Diabetes mellitus without complication (HCC)     Comment:  Type II No date: GERD (gastroesophageal reflux disease) No date: Hyperlipidemia No date: Hypertension     Comment:  neg stress test 10 yrs ago - Dr Humphrey Rolls No date: PVD (peripheral vascular disease) ([place]) No date: Sleep apnea     Comment:  has BiPAP -doesn't use No date: Stroke [facility])     Comment:   no residual effects No date: Varicose vein     Comment:  treated by Dr. Delana Meyer   Reproductive/Obstetrics                              Lab Results  Component Value Date   WBC 7.8 11/01/2020   HGB 14.9 11/01/2020   HCT 43.9 11/01/2020   MCV 86.4 11/01/2020   PLT 198 11/01/2020    Anesthesia Physical  Anesthesia Plan  ASA: 3  Anesthesia Plan: Spinal   Post-op Pain Management:    Induction: Intravenous  PONV Risk Score and Plan: 1 and Propofol infusion and TIVA  Airway Management Planned: Natural Airway and Simple Face Mask  Additional Equipment:   Intra-op Plan:   Post-operative Plan:   Informed Consent: I have reviewed the patients History and Physical, chart, labs and discussed the procedure including the risks, benefits and alternatives for the proposed anesthesia with the patient or authorized representative who has indicated his/her understanding and acceptance.     Dental advisory given  Plan Discussed with: CRNA and Anesthesiologist  Anesthesia Plan Comments:         Anesthesia Quick Evaluation

## 2020-11-12 ENCOUNTER — Encounter: Payer: Self-pay | Admitting: Surgery

## 2020-11-12 NOTE — Anesthesia Postprocedure Evaluation (Signed)
Anesthesia Post Note  Patient: Raymond [name]  Procedure(s) Performed: TOTAL KNEE ARTHROPLASTY (Left: Knee)  Patient location during evaluation: PACU Anesthesia Type: Spinal Level of consciousness: oriented and awake and alert Pain management: pain level controlled Vital Signs Assessment: post-procedure vital signs reviewed and stable Respiratory status: spontaneous breathing, respiratory function stable and patient connected to nasal cannula oxygen Cardiovascular status: blood pressure returned to baseline and stable Postop Assessment: no headache, no backache, no apparent nausea or vomiting, patient able to bend at knees and adequate PO intake Anesthetic complications: no   No notable events documented.   Last Vitals:  Vitals:   11/11/20 1300 11/11/20 1500  BP: 137/78 135/74  Pulse: (!) 59 62  Resp: 17 18  Temp:    SpO2: 99% 98%    Last Pain:  Vitals:   11/11/20 1500  TempSrc:   PainSc: 2                  Martha Clan

## 2020-11-13 DIAGNOSIS — K219 Gastro-esophageal reflux disease without esophagitis: Secondary | ICD-10-CM | POA: Diagnosis not present

## 2020-11-13 DIAGNOSIS — M5416 Radiculopathy, lumbar region: Secondary | ICD-10-CM | POA: Diagnosis not present

## 2020-11-13 DIAGNOSIS — Z7901 Long term (current) use of anticoagulants: Secondary | ICD-10-CM | POA: Diagnosis not present

## 2020-11-13 DIAGNOSIS — E119 Type 2 diabetes mellitus without complications: Secondary | ICD-10-CM | POA: Diagnosis not present

## 2020-11-13 DIAGNOSIS — Z79891 Long term (current) use of opiate analgesic: Secondary | ICD-10-CM | POA: Diagnosis not present

## 2020-11-13 DIAGNOSIS — I10 Essential (primary) hypertension: Secondary | ICD-10-CM | POA: Diagnosis not present

## 2020-11-13 DIAGNOSIS — F32A Depression, unspecified: Secondary | ICD-10-CM | POA: Diagnosis not present

## 2020-11-13 DIAGNOSIS — E785 Hyperlipidemia, unspecified: Secondary | ICD-10-CM | POA: Diagnosis not present

## 2020-11-13 DIAGNOSIS — G4730 Sleep apnea, unspecified: Secondary | ICD-10-CM | POA: Diagnosis not present

## 2020-11-13 DIAGNOSIS — Z471 Aftercare following joint replacement surgery: Secondary | ICD-10-CM | POA: Diagnosis not present

## 2020-11-13 DIAGNOSIS — Z7984 Long term (current) use of oral hypoglycemic drugs: Secondary | ICD-10-CM | POA: Diagnosis not present

## 2020-11-13 DIAGNOSIS — Z96652 Presence of left artificial knee joint: Secondary | ICD-10-CM | POA: Diagnosis not present

## 2020-11-15 DIAGNOSIS — Z471 Aftercare following joint replacement surgery: Secondary | ICD-10-CM | POA: Diagnosis not present

## 2020-11-15 DIAGNOSIS — Z96652 Presence of left artificial knee joint: Secondary | ICD-10-CM | POA: Diagnosis not present

## 2020-11-15 DIAGNOSIS — E785 Hyperlipidemia, unspecified: Secondary | ICD-10-CM | POA: Diagnosis not present

## 2020-11-15 DIAGNOSIS — G4730 Sleep apnea, unspecified: Secondary | ICD-10-CM | POA: Diagnosis not present

## 2020-11-15 DIAGNOSIS — F32A Depression, unspecified: Secondary | ICD-10-CM | POA: Diagnosis not present

## 2020-11-15 DIAGNOSIS — E119 Type 2 diabetes mellitus without complications: Secondary | ICD-10-CM | POA: Diagnosis not present

## 2020-11-15 DIAGNOSIS — M5416 Radiculopathy, lumbar region: Secondary | ICD-10-CM | POA: Diagnosis not present

## 2020-11-15 DIAGNOSIS — Z7984 Long term (current) use of oral hypoglycemic drugs: Secondary | ICD-10-CM | POA: Diagnosis not present

## 2020-11-15 DIAGNOSIS — I10 Essential (primary) hypertension: Secondary | ICD-10-CM | POA: Diagnosis not present

## 2020-11-15 DIAGNOSIS — Z79891 Long term (current) use of opiate analgesic: Secondary | ICD-10-CM | POA: Diagnosis not present

## 2020-11-15 DIAGNOSIS — Z7901 Long term (current) use of anticoagulants: Secondary | ICD-10-CM | POA: Diagnosis not present

## 2020-11-15 DIAGNOSIS — G4733 Obstructive sleep apnea (adult) (pediatric): Secondary | ICD-10-CM | POA: Diagnosis not present

## 2020-11-15 DIAGNOSIS — K219 Gastro-esophageal reflux disease without esophagitis: Secondary | ICD-10-CM | POA: Diagnosis not present

## 2020-11-19 DIAGNOSIS — Z96652 Presence of left artificial knee joint: Secondary | ICD-10-CM | POA: Diagnosis not present

## 2020-11-19 DIAGNOSIS — Z471 Aftercare following joint replacement surgery: Secondary | ICD-10-CM | POA: Diagnosis not present

## 2020-11-19 DIAGNOSIS — I10 Essential (primary) hypertension: Secondary | ICD-10-CM | POA: Diagnosis not present

## 2020-11-19 DIAGNOSIS — F32A Depression, unspecified: Secondary | ICD-10-CM | POA: Diagnosis not present

## 2020-11-19 DIAGNOSIS — Z7901 Long term (current) use of anticoagulants: Secondary | ICD-10-CM | POA: Diagnosis not present

## 2020-11-19 DIAGNOSIS — M5416 Radiculopathy, lumbar region: Secondary | ICD-10-CM | POA: Diagnosis not present

## 2020-11-19 DIAGNOSIS — Z79891 Long term (current) use of opiate analgesic: Secondary | ICD-10-CM | POA: Diagnosis not present

## 2020-11-19 DIAGNOSIS — Z7984 Long term (current) use of oral hypoglycemic drugs: Secondary | ICD-10-CM | POA: Diagnosis not present

## 2020-11-19 DIAGNOSIS — G4730 Sleep apnea, unspecified: Secondary | ICD-10-CM | POA: Diagnosis not present

## 2020-11-19 DIAGNOSIS — E785 Hyperlipidemia, unspecified: Secondary | ICD-10-CM | POA: Diagnosis not present

## 2020-11-19 DIAGNOSIS — E119 Type 2 diabetes mellitus without complications: Secondary | ICD-10-CM | POA: Diagnosis not present

## 2020-11-19 DIAGNOSIS — K219 Gastro-esophageal reflux disease without esophagitis: Secondary | ICD-10-CM | POA: Diagnosis not present

## 2020-11-20 DIAGNOSIS — E119 Type 2 diabetes mellitus without complications: Secondary | ICD-10-CM | POA: Diagnosis not present

## 2020-11-21 DIAGNOSIS — Z96652 Presence of left artificial knee joint: Secondary | ICD-10-CM | POA: Diagnosis not present

## 2020-11-21 DIAGNOSIS — I10 Essential (primary) hypertension: Secondary | ICD-10-CM | POA: Diagnosis not present

## 2020-11-21 DIAGNOSIS — Z79891 Long term (current) use of opiate analgesic: Secondary | ICD-10-CM | POA: Diagnosis not present

## 2020-11-21 DIAGNOSIS — Z471 Aftercare following joint replacement surgery: Secondary | ICD-10-CM | POA: Diagnosis not present

## 2020-11-21 DIAGNOSIS — Z7901 Long term (current) use of anticoagulants: Secondary | ICD-10-CM | POA: Diagnosis not present

## 2020-11-21 DIAGNOSIS — K219 Gastro-esophageal reflux disease without esophagitis: Secondary | ICD-10-CM | POA: Diagnosis not present

## 2020-11-21 DIAGNOSIS — Z7984 Long term (current) use of oral hypoglycemic drugs: Secondary | ICD-10-CM | POA: Diagnosis not present

## 2020-11-21 DIAGNOSIS — E119 Type 2 diabetes mellitus without complications: Secondary | ICD-10-CM | POA: Diagnosis not present

## 2020-11-21 DIAGNOSIS — F32A Depression, unspecified: Secondary | ICD-10-CM | POA: Diagnosis not present

## 2020-11-21 DIAGNOSIS — E785 Hyperlipidemia, unspecified: Secondary | ICD-10-CM | POA: Diagnosis not present

## 2020-11-21 DIAGNOSIS — M5416 Radiculopathy, lumbar region: Secondary | ICD-10-CM | POA: Diagnosis not present

## 2020-11-21 DIAGNOSIS — G4730 Sleep apnea, unspecified: Secondary | ICD-10-CM | POA: Diagnosis not present

## 2020-11-23 DIAGNOSIS — Z96652 Presence of left artificial knee joint: Secondary | ICD-10-CM | POA: Diagnosis not present

## 2020-11-23 DIAGNOSIS — E119 Type 2 diabetes mellitus without complications: Secondary | ICD-10-CM | POA: Diagnosis not present

## 2020-11-23 DIAGNOSIS — Z7984 Long term (current) use of oral hypoglycemic drugs: Secondary | ICD-10-CM | POA: Diagnosis not present

## 2020-11-23 DIAGNOSIS — M5416 Radiculopathy, lumbar region: Secondary | ICD-10-CM | POA: Diagnosis not present

## 2020-11-23 DIAGNOSIS — K219 Gastro-esophageal reflux disease without esophagitis: Secondary | ICD-10-CM | POA: Diagnosis not present

## 2020-11-23 DIAGNOSIS — Z471 Aftercare following joint replacement surgery: Secondary | ICD-10-CM | POA: Diagnosis not present

## 2020-11-23 DIAGNOSIS — G4730 Sleep apnea, unspecified: Secondary | ICD-10-CM | POA: Diagnosis not present

## 2020-11-23 DIAGNOSIS — E785 Hyperlipidemia, unspecified: Secondary | ICD-10-CM | POA: Diagnosis not present

## 2020-11-23 DIAGNOSIS — Z79891 Long term (current) use of opiate analgesic: Secondary | ICD-10-CM | POA: Diagnosis not present

## 2020-11-23 DIAGNOSIS — F32A Depression, unspecified: Secondary | ICD-10-CM | POA: Diagnosis not present

## 2020-11-23 DIAGNOSIS — Z7901 Long term (current) use of anticoagulants: Secondary | ICD-10-CM | POA: Diagnosis not present

## 2020-11-23 DIAGNOSIS — I10 Essential (primary) hypertension: Secondary | ICD-10-CM | POA: Diagnosis not present

## 2020-11-25 DIAGNOSIS — M25562 Pain in left knee: Secondary | ICD-10-CM | POA: Diagnosis not present

## 2020-11-25 DIAGNOSIS — Z96652 Presence of left artificial knee joint: Secondary | ICD-10-CM | POA: Diagnosis not present

## 2020-11-25 DIAGNOSIS — M25662 Stiffness of left knee, not elsewhere classified: Secondary | ICD-10-CM | POA: Diagnosis not present

## 2020-11-25 DIAGNOSIS — M6281 Muscle weakness (generalized): Secondary | ICD-10-CM | POA: Diagnosis not present

## 2020-11-26 DIAGNOSIS — E119 Type 2 diabetes mellitus without complications: Secondary | ICD-10-CM | POA: Diagnosis not present

## 2020-12-01 DIAGNOSIS — M25562 Pain in left knee: Secondary | ICD-10-CM | POA: Diagnosis not present

## 2020-12-01 DIAGNOSIS — M6281 Muscle weakness (generalized): Secondary | ICD-10-CM | POA: Diagnosis not present

## 2020-12-01 DIAGNOSIS — M25662 Stiffness of left knee, not elsewhere classified: Secondary | ICD-10-CM | POA: Diagnosis not present

## 2020-12-01 DIAGNOSIS — Z96652 Presence of left artificial knee joint: Secondary | ICD-10-CM | POA: Diagnosis not present

## 2020-12-03 DIAGNOSIS — M25662 Stiffness of left knee, not elsewhere classified: Secondary | ICD-10-CM | POA: Diagnosis not present

## 2020-12-03 DIAGNOSIS — M25562 Pain in left knee: Secondary | ICD-10-CM | POA: Diagnosis not present

## 2020-12-03 DIAGNOSIS — Z96652 Presence of left artificial knee joint: Secondary | ICD-10-CM | POA: Diagnosis not present

## 2020-12-03 DIAGNOSIS — M6281 Muscle weakness (generalized): Secondary | ICD-10-CM | POA: Diagnosis not present

## 2020-12-08 DIAGNOSIS — Z96652 Presence of left artificial knee joint: Secondary | ICD-10-CM | POA: Diagnosis not present

## 2020-12-08 DIAGNOSIS — M25662 Stiffness of left knee, not elsewhere classified: Secondary | ICD-10-CM | POA: Diagnosis not present

## 2020-12-08 DIAGNOSIS — M6281 Muscle weakness (generalized): Secondary | ICD-10-CM | POA: Diagnosis not present

## 2020-12-08 DIAGNOSIS — M25562 Pain in left knee: Secondary | ICD-10-CM | POA: Diagnosis not present

## 2020-12-10 DIAGNOSIS — M25662 Stiffness of left knee, not elsewhere classified: Secondary | ICD-10-CM | POA: Diagnosis not present

## 2020-12-10 DIAGNOSIS — M25562 Pain in left knee: Secondary | ICD-10-CM | POA: Diagnosis not present

## 2020-12-10 DIAGNOSIS — Z96652 Presence of left artificial knee joint: Secondary | ICD-10-CM | POA: Diagnosis not present

## 2020-12-10 DIAGNOSIS — M6281 Muscle weakness (generalized): Secondary | ICD-10-CM | POA: Diagnosis not present

## 2020-12-11 ENCOUNTER — Other Ambulatory Visit: Payer: Self-pay | Admitting: Family Medicine

## 2020-12-11 DIAGNOSIS — Z8659 Personal history of other mental and behavioral disorders: Secondary | ICD-10-CM

## 2020-12-11 DIAGNOSIS — E7801 Familial hypercholesterolemia: Secondary | ICD-10-CM

## 2020-12-11 DIAGNOSIS — F419 Anxiety disorder, unspecified: Secondary | ICD-10-CM

## 2020-12-11 DIAGNOSIS — K219 Gastro-esophageal reflux disease without esophagitis: Secondary | ICD-10-CM

## 2020-12-11 DIAGNOSIS — F3341 Major depressive disorder, recurrent, in partial remission: Secondary | ICD-10-CM

## 2020-12-12 NOTE — Telephone Encounter (Signed)
Requested Prescriptions  Pending Prescriptions Disp Refills  . pantoprazole (PROTONIX) 40 MG tablet [Pharmacy Med Name: PANTOPRAZOLE SOD DR 40 MG TAB] 90 tablet 1    Sig: TAKE 1 TABLET BY MOUTH EVERY DAY     Gastroenterology: Proton Pump Inhibitors Passed - 12/11/2020  6:12 PM      Passed - Valid encounter within last 12 months    Recent Outpatient Visits          5 months ago Essential hypertension   [place], MD   11 months ago Type 2 diabetes mellitus without complication, without long-term current use of insulin ([place])   [facility] Juline Patch, MD   1 year ago Taking medication for chronic disease   [facility] Juline Patch, MD   2 years ago Type 2 diabetes mellitus without complication, without long-term current use of insulin ([place])   [facility] Juline Patch, MD   2 years ago Type 2 diabetes mellitus without complication, without long-term current use of insulin ([place])   [facility] Juline Patch, MD      Future Appointments            In 1 month Juline Patch, MD [facility] [place]           . rosuvastatin (CRESTOR) 10 MG tablet [Pharmacy Med Name: ROSUVASTATIN CALCIUM 10 MG TAB] 90 tablet 2    Sig: TAKE 1 TABLET BY MOUTH EVERY DAY     Cardiovascular:  Antilipid - Statins Passed - 12/11/2020  6:12 PM      Passed - Total Cholesterol in normal range and within 360 days    Cholesterol, Total  Date Value Ref Range Status  07/12/2020 143 100 - 199 mg/dL Final         Passed - LDL in normal range and within 360 days    LDL Chol Calc (NIH)  Date Value Ref Range Status  07/12/2020 81 0 - 99 mg/dL Final         Passed - HDL in normal range and within 360 days    HDL  Date Value Ref Range Status  07/12/2020 46 >39 mg/dL Final         Passed - Triglycerides in normal range and within 360 days    Triglycerides  Date Value Ref Range Status  07/12/2020 84 0 - 149 mg/dL Final          Passed - Patient is not pregnant      Passed - Valid encounter within last 12 months    Recent Outpatient Visits          5 months ago Essential hypertension   [place], Deanna C, MD   11 months ago Type 2 diabetes mellitus without complication, without long-term current use of insulin ([place])   [place] Juline Patch, MD   1 year ago Taking medication for chronic disease   [facility] Juline Patch, MD   2 years ago Type 2 diabetes mellitus without complication, without long-term current use of insulin ([place])   [facility] Juline Patch, MD   2 years ago Type 2 diabetes mellitus without complication, without long-term current use of insulin ([place])   [facility] Juline Patch, MD      Future Appointments            In 1 month Juline Patch,  MD [facility]           . sertraline (ZOLOFT) 50 MG tablet [Pharmacy Med Name: SERTRALINE HCL 50 MG TABLET] 90 tablet 1    Sig: TAKE 1 TABLET BY MOUTH EVERY DAY     Psychiatry:  Antidepressants - SSRI Passed - 12/11/2020  6:12 PM      Passed - Completed PHQ-2 or PHQ-9 in the last 360 days      Passed - Valid encounter within last 6 months    Recent Outpatient Visits          5 months ago Essential hypertension   [place], MD   11 months ago Type 2 diabetes mellitus without complication, without long-term current use of insulin ([place])   [facility] Juline Patch, MD   1 year ago Taking medication for chronic disease   [facility] Juline Patch, MD   2 years ago Type 2 diabetes mellitus without complication, without long-term current use of insulin ([place])   [facility] Juline Patch, MD   2 years ago Type 2 diabetes mellitus without complication, without long-term current use of insulin ([place])   [facility] Juline Patch, MD      Future Appointments            In 1 month  Juline Patch, MD [facility], [facility]

## 2020-12-15 DIAGNOSIS — Z96652 Presence of left artificial knee joint: Secondary | ICD-10-CM | POA: Diagnosis not present

## 2020-12-15 DIAGNOSIS — M25562 Pain in left knee: Secondary | ICD-10-CM | POA: Diagnosis not present

## 2020-12-15 DIAGNOSIS — M6281 Muscle weakness (generalized): Secondary | ICD-10-CM | POA: Diagnosis not present

## 2020-12-15 DIAGNOSIS — M25662 Stiffness of left knee, not elsewhere classified: Secondary | ICD-10-CM | POA: Diagnosis not present

## 2020-12-17 DIAGNOSIS — M6281 Muscle weakness (generalized): Secondary | ICD-10-CM | POA: Diagnosis not present

## 2020-12-17 DIAGNOSIS — Z96652 Presence of left artificial knee joint: Secondary | ICD-10-CM | POA: Diagnosis not present

## 2020-12-17 DIAGNOSIS — M25562 Pain in left knee: Secondary | ICD-10-CM | POA: Diagnosis not present

## 2020-12-17 DIAGNOSIS — M25662 Stiffness of left knee, not elsewhere classified: Secondary | ICD-10-CM | POA: Diagnosis not present

## 2020-12-21 DIAGNOSIS — E119 Type 2 diabetes mellitus without complications: Secondary | ICD-10-CM | POA: Diagnosis not present

## 2020-12-22 DIAGNOSIS — M6281 Muscle weakness (generalized): Secondary | ICD-10-CM | POA: Diagnosis not present

## 2020-12-22 DIAGNOSIS — M25662 Stiffness of left knee, not elsewhere classified: Secondary | ICD-10-CM | POA: Diagnosis not present

## 2020-12-22 DIAGNOSIS — Z96652 Presence of left artificial knee joint: Secondary | ICD-10-CM | POA: Diagnosis not present

## 2020-12-22 DIAGNOSIS — M25562 Pain in left knee: Secondary | ICD-10-CM | POA: Diagnosis not present

## 2020-12-24 DIAGNOSIS — M25662 Stiffness of left knee, not elsewhere classified: Secondary | ICD-10-CM | POA: Diagnosis not present

## 2020-12-24 DIAGNOSIS — Z96652 Presence of left artificial knee joint: Secondary | ICD-10-CM | POA: Diagnosis not present

## 2020-12-24 DIAGNOSIS — M6281 Muscle weakness (generalized): Secondary | ICD-10-CM | POA: Diagnosis not present

## 2020-12-24 DIAGNOSIS — M25562 Pain in left knee: Secondary | ICD-10-CM | POA: Diagnosis not present

## 2020-12-27 DIAGNOSIS — M25562 Pain in left knee: Secondary | ICD-10-CM | POA: Diagnosis not present

## 2020-12-27 DIAGNOSIS — Z96652 Presence of left artificial knee joint: Secondary | ICD-10-CM | POA: Diagnosis not present

## 2020-12-27 DIAGNOSIS — M25662 Stiffness of left knee, not elsewhere classified: Secondary | ICD-10-CM | POA: Diagnosis not present

## 2020-12-27 DIAGNOSIS — M6281 Muscle weakness (generalized): Secondary | ICD-10-CM | POA: Diagnosis not present

## 2020-12-29 DIAGNOSIS — M25662 Stiffness of left knee, not elsewhere classified: Secondary | ICD-10-CM | POA: Diagnosis not present

## 2020-12-29 DIAGNOSIS — M25562 Pain in left knee: Secondary | ICD-10-CM | POA: Diagnosis not present

## 2020-12-29 DIAGNOSIS — Z96652 Presence of left artificial knee joint: Secondary | ICD-10-CM | POA: Diagnosis not present

## 2020-12-29 DIAGNOSIS — M6281 Muscle weakness (generalized): Secondary | ICD-10-CM | POA: Diagnosis not present

## 2021-01-06 DIAGNOSIS — M25562 Pain in left knee: Secondary | ICD-10-CM | POA: Diagnosis not present

## 2021-01-06 DIAGNOSIS — Z96652 Presence of left artificial knee joint: Secondary | ICD-10-CM | POA: Diagnosis not present

## 2021-01-06 DIAGNOSIS — M25662 Stiffness of left knee, not elsewhere classified: Secondary | ICD-10-CM | POA: Diagnosis not present

## 2021-01-06 DIAGNOSIS — M6281 Muscle weakness (generalized): Secondary | ICD-10-CM | POA: Diagnosis not present

## 2021-01-11 DIAGNOSIS — Z96652 Presence of left artificial knee joint: Secondary | ICD-10-CM | POA: Diagnosis not present

## 2021-01-11 DIAGNOSIS — M25562 Pain in left knee: Secondary | ICD-10-CM | POA: Diagnosis not present

## 2021-01-11 DIAGNOSIS — M6281 Muscle weakness (generalized): Secondary | ICD-10-CM | POA: Diagnosis not present

## 2021-01-11 DIAGNOSIS — M25662 Stiffness of left knee, not elsewhere classified: Secondary | ICD-10-CM | POA: Diagnosis not present

## 2021-01-12 ENCOUNTER — Ambulatory Visit: Payer: BC Managed Care – PPO | Admitting: Family Medicine

## 2021-01-17 ENCOUNTER — Other Ambulatory Visit: Payer: Self-pay

## 2021-01-17 ENCOUNTER — Ambulatory Visit (INDEPENDENT_AMBULATORY_CARE_PROVIDER_SITE_OTHER): Payer: BC Managed Care – PPO | Admitting: Family Medicine

## 2021-01-17 ENCOUNTER — Encounter: Payer: Self-pay | Admitting: Family Medicine

## 2021-01-17 VITALS — BP 122/78 | HR 65 | Ht 73.0 in | Wt 257.0 lb

## 2021-01-17 DIAGNOSIS — I10 Essential (primary) hypertension: Secondary | ICD-10-CM | POA: Diagnosis not present

## 2021-01-17 DIAGNOSIS — Z23 Encounter for immunization: Secondary | ICD-10-CM | POA: Diagnosis not present

## 2021-01-17 DIAGNOSIS — E119 Type 2 diabetes mellitus without complications: Secondary | ICD-10-CM

## 2021-01-17 MED ORDER — LOSARTAN POTASSIUM 100 MG PO TABS: 50.0000 mg | ORAL_TABLET | Freq: Every day | ORAL | 1 refills | Status: DC

## 2021-01-17 MED ORDER — AMLODIPINE BESYLATE 10 MG PO TABS: 10.0000 mg | ORAL_TABLET | Freq: Every day | ORAL | 1 refills | Status: DC

## 2021-01-17 MED ORDER — METFORMIN HCL 500 MG PO TABS: 1000.0000 mg | ORAL_TABLET | Freq: Two times a day (BID) | ORAL | 1 refills | Status: DC

## 2021-01-17 MED ORDER — TRIAMTERENE-HCTZ 37.5-25 MG PO CAPS: 1.0000 | ORAL_CAPSULE | Freq: Every day | ORAL | 1 refills | Status: DC

## 2021-01-17 MED ORDER — GLIPIZIDE 5 MG PO TABS: 5.0000 mg | ORAL_TABLET | Freq: Two times a day (BID) | ORAL | 1 refills | Status: DC

## 2021-01-17 NOTE — Progress Notes (Signed)
Date:  01/17/2021   Name:  Raymond HUARACHA   DOB:  [birth year]   MRN:  [id number]   Chief Complaint: Hypertension and Diabetes (Has been elevated once but has been running good)  Hypertension This is a chronic problem. The current episode started more than 1 year ago. The problem has been gradually improving since onset. The problem is controlled. Pertinent negatives include no anxiety, blurred vision, chest pain, headaches, malaise/fatigue, neck pain, orthopnea, palpitations, peripheral edema, PND, shortness of breath or sweats. Risk factors for coronary artery disease include diabetes mellitus. Past treatments include angiotensin blockers, calcium channel blockers and diuretics. The current treatment provides moderate improvement. There are no compliance problems.  There is no history of angina, kidney disease, CAD/MI, CVA, heart failure, left ventricular hypertrophy, PVD or retinopathy. There is no history of chronic renal disease, a hypertension causing med or renovascular disease.  Diabetes He presents for his follow-up diabetic visit. Pertinent negatives for hypoglycemia include no dizziness, headaches, nervousness/anxiousness or sweats. Pertinent negatives for diabetes include no blurred vision, no chest pain and no polydipsia. There are no hypoglycemic complications. There are no diabetic complications. Pertinent negatives for diabetic complications include no CVA, PVD or retinopathy. Current diabetic treatment includes oral agent (dual therapy). His breakfast blood glucose is taken between 8-9 am. His breakfast blood glucose range is generally 140-180 mg/dl. An ACE inhibitor/angiotensin II receptor blocker is being taken.   Lab Results  Component Value Date   NA 139 11/01/2020   K 3.9 11/01/2020   CO2 27 11/01/2020   GLUCOSE 118 (H) 11/01/2020   BUN 26 (H) 11/01/2020   CREATININE 0.97 11/01/2020   CALCIUM 9.8 11/01/2020   EGFR 95 07/12/2020   GFRNONAA >60 11/01/2020   Lab Results   Component Value Date   CHOL 143 07/12/2020   HDL 46 07/12/2020   LDLCALC 81 07/12/2020   TRIG 84 07/12/2020   CHOLHDL 3.3 07/24/2016   Lab Results  Component Value Date   TSH 2.620 11/07/2018   Lab Results  Component Value Date   HGBA1C 7.8 (H) 07/12/2020   Lab Results  Component Value Date   WBC 7.8 11/01/2020   HGB 14.9 11/01/2020   HCT 43.9 11/01/2020   MCV 86.4 11/01/2020   PLT 198 11/01/2020   Lab Results  Component Value Date   ALT 16 11/01/2020   AST 17 11/01/2020   ALKPHOS 64 11/01/2020   BILITOT 1.3 (H) 11/01/2020   No components found for: VITD  Review of Systems  Constitutional:  Negative for chills, fever and malaise/fatigue.  HENT:  Negative for drooling, ear discharge, ear pain and sore throat.   Eyes:  Negative for blurred vision.  Respiratory:  Negative for cough, shortness of breath and wheezing.   Cardiovascular:  Negative for chest pain, palpitations, orthopnea, leg swelling and PND.  Gastrointestinal:  Negative for abdominal pain, blood in stool, constipation, diarrhea and nausea.  Endocrine: Negative for polydipsia.  Genitourinary:  Negative for dysuria, frequency, hematuria and urgency.  Musculoskeletal:  Negative for back pain, myalgias and neck pain.  Skin:  Negative for rash.  Allergic/Immunologic: Negative for environmental allergies.  Neurological:  Negative for dizziness and headaches.  Hematological:  Does not bruise/bleed easily.  Psychiatric/Behavioral:  Negative for suicidal ideas. The patient is not nervous/anxious.    Patient Active Problem List   Diagnosis Date Noted   Varicose veins of leg with pain, left 12/30/2018   Carpal tunnel syndrome, right 08/09/2018   Primary osteoarthritis of  left shoulder 08/09/2018   Rotator cuff tendinitis, left 08/09/2018   Strain of left knee 08/06/2017   Obesity (BMI 35.0-39.9 without comorbidity) 08/03/2017   Status post arthroscopy of left knee 08/03/2017   Complex tear of medial meniscus  of left knee as current injury 07/26/2017   Chest pain 07/24/2016   Uncontrolled type 2 diabetes mellitus without complication, without long-term current use of insulin 07/24/2016   Status post total hip replacement, left 01/25/2016   Benign essential HTN 07/21/2015   Depression 07/21/2015   Hyperlipidemia, mixed 07/21/2015   GERD (gastroesophageal reflux disease) 12/04/2014   Biceps tendinitis 07/21/2014   Impingement syndrome of shoulder 07/21/2014   Primary osteoarthritis of right knee 08/14/2013   Neuritis or radiculitis due to rupture of lumbar intervertebral disc 08/14/2013    No Known Allergies  Past Surgical History:  Procedure Laterality Date   CARPAL TUNNEL RELEASE Right 01/02/2019   Procedure: CARPAL TUNNEL RELEASE ENDOSCOPIC;  Surgeon: Corky Mull, MD;  Location: [facility];  Service: Orthopedics;  Laterality: Right;   CHOLECYSTECTOMY     CHONDROPLASTY  10/05/09   knee- shaving patellofemoral joint   COLONOSCOPY  2009?   ENDOVENOUS ABLATION SAPHENOUS VEIN W/ LASER     INJECTION KNEE Left 12/23/2019   Procedure: KNEE INJECTION;  Surgeon: Corky Mull, MD;  Location: [facility];  Service: Orthopedics;  Laterality: Left;   JOINT REPLACEMENT Left    hip   KNEE ARTHROSCOPY Left 07/25/2017   Procedure: Arthroscopic partial medial meniscectomy; arthroscopic debridement; and arthroscopic abrasion chondroplasty of medial femoral condyle, lateral tibial plateau, and femoral trochlea; left knee.;  Surgeon: Corky Mull, MD;  Location: [facility];  Service: Orthopedics;  Laterality: Left;   KNEE ARTHROSCOPY WITH MEDIAL MENISECTOMY Right    Partial medial and lateral menisectomy   SHOULDER ARTHROSCOPY Left 08/10/2014   Procedure: ARTHROSCOPY SHOULDER WITH LIMITED DEBRIDEMENT, REMOVAL OF LOOSE BODY AND RELEASE OF LONG END BICEPS TENDON;  Surgeon: Leanor Kail, MD;  Location: [facility];  Service: Orthopedics;  Laterality: Left;   TOTAL HIP ARTHROPLASTY Left 01/25/2016    Procedure: TOTAL HIP ARTHROPLASTY;  Surgeon: Corky Mull, MD;  Location: [facility];  Service: Orthopedics;  Laterality: Left;   TOTAL KNEE ARTHROPLASTY Right 12/23/2019   Procedure: TOTAL KNEE ARTHROPLASTY;  Surgeon: Corky Mull, MD;  Location: [facility];  Service: Orthopedics;  Laterality: Right;   TOTAL KNEE ARTHROPLASTY Left 11/11/2020   Procedure: TOTAL KNEE ARTHROPLASTY;  Surgeon: Corky Mull, MD;  Location: [facility];  Service: Orthopedics;  Laterality: Left;    Social History   Tobacco Use   Smoking status: Never   Smokeless tobacco: Never  Vaping Use   Vaping Use: Never used  Substance Use Topics   Alcohol use: Yes    Comment: rare   Drug use: No     Medication list has been reviewed and updated.  Current Meds  Medication Sig   ACCU-CHEK AVIVA PLUS test strip TEST AS DIRECTED   amLODipine (NORVASC) 10 MG tablet Take 1 tablet (10 mg total) by mouth daily.   buPROPion (WELLBUTRIN) 75 MG tablet Take 1 tablet (75 mg total) by mouth 2 (two) times daily.   cyclobenzaprine (FLEXERIL) 10 MG tablet Take 10 mg by mouth 2 (two) times daily as needed.   fluticasone (FLONASE) 50 MCG/ACT nasal spray Place 2 sprays into both nostrils daily as needed for allergies or rhinitis.   gabapentin (NEURONTIN) 300 MG capsule 3 capsules tid (Patient taking differently: Take  900 mg by mouth 3 (three) times daily.)   glipiZIDE (GLUCOTROL) 5 MG tablet TAKE 1 TABLET BY MOUTH 2 TIMES DAILY BEFORE A MEAL (Patient taking differently: Take 5-10 mg by mouth 2 (two) times daily before a meal.)   Glucosamine-Chondroit-Vit C-Mn (GLUCOSAMINE CHONDR 1500 COMPLX PO) Take 1,500 mg by mouth daily. msm   losartan (COZAAR) 100 MG tablet Take 0.5 tablets (50 mg total) by mouth daily.   metFORMIN (GLUCOPHAGE) 500 MG tablet TAKE 1 TABLET BY MOUTH 2 TIMES DAILY WITH A MEAL. (Patient taking differently: Take 1,000 mg by mouth 2 (two) times daily with a meal.)   oxyCODONE (ROXICODONE) 5 MG immediate release tablet Take  1-2 tablets (5-10 mg total) by mouth every 4 (four) hours as needed for moderate pain or severe pain.   pantoprazole (PROTONIX) 40 MG tablet TAKE 1 TABLET BY MOUTH EVERY DAY   rosuvastatin (CRESTOR) 10 MG tablet TAKE 1 TABLET BY MOUTH EVERY DAY   sertraline (ZOLOFT) 50 MG tablet TAKE 1 TABLET BY MOUTH EVERY DAY   traMADol (ULTRAM) 50 MG tablet Take 1 tablet (50 mg total) by mouth every 6 (six) hours as needed (Breakthrough pain).   triamterene-hydrochlorothiazide (DYAZIDE) 37.5-25 MG capsule Take 1 each (1 capsule total) by mouth daily.    PHQ 2/9 Scores 01/17/2021 07/12/2020 12/22/2019 04/30/2019  PHQ - 2 Score 0 0 0 0  PHQ- 9 Score 2 0 0 0    GAD 7 : Generalized Anxiety Score 01/17/2021 07/12/2020 12/22/2019 04/30/2019  Nervous, Anxious, on Edge 0 0 0 0  Control/stop worrying 0 0 0 0  Worry too much - different things 0 0 0 0  Trouble relaxing 0 0 0 0  Restless 0 0 0 0  Easily annoyed or irritable 1 0 0 0  Afraid - awful might happen 0 0 0 0  Total GAD 7 Score 1 0 0 0    BP Readings from Last 3 Encounters:  01/17/21 122/78  11/11/20 135/74  11/01/20 131/72    Physical Exam Vitals and nursing note reviewed.  HENT:     Head: Normocephalic.     Right Ear: Tympanic membrane and external ear normal.     Left Ear: Tympanic membrane and external ear normal.     Nose: Nose normal.     Mouth/Throat:     Pharynx: Oropharynx is clear.  Eyes:     General: No scleral icterus.       Right eye: No discharge.        Left eye: No discharge.     Conjunctiva/sclera: Conjunctivae normal.     Pupils: Pupils are equal, round, and reactive to light.  Neck:     Thyroid: No thyromegaly.     Vascular: No JVD.     Trachea: No tracheal deviation.  Cardiovascular:     Rate and Rhythm: Normal rate and regular rhythm.     Heart sounds: Normal heart sounds. No murmur heard.   No friction rub. No gallop.  Pulmonary:     Effort: No respiratory distress.     Breath sounds: Normal breath sounds. No  wheezing, rhonchi or rales.  Abdominal:     General: Bowel sounds are normal.     Palpations: Abdomen is soft. There is no mass.     Tenderness: There is no abdominal tenderness. There is no guarding or rebound.  Musculoskeletal:        General: No tenderness. Normal range of motion.     Cervical back: Normal  range of motion and neck supple.  Lymphadenopathy:     Cervical: No cervical adenopathy.  Skin:    General: Skin is warm.     Findings: No rash.  Neurological:     Mental Status: He is alert and oriented to person, place, and time.     Cranial Nerves: No cranial nerve deficit.     Deep Tendon Reflexes: Reflexes are normal and symmetric.    Wt Readings from Last 3 Encounters:  01/17/21 257 lb (116.6 kg)  11/11/20 257 lb 15 oz (117 kg)  11/01/20 258 lb (117 kg)    BP 122/78   Pulse 65   Ht _0  (1.854 m)   Wt 257 lb (116.6 kg)   SpO2 96%   BMI 33.91 kg/m   Assessment and Plan:  1. Essential hypertension Chronic.  Controlled.  Stable.  Blood pressure 122/78.  Continue Dyazide 37.5-25 1 a day, losartan 100 mg 1/2 tablet daily, and amlodipine 10 mg 1 a day.  Review of previous renal function is acceptable. - triamterene-hydrochlorothiazide (DYAZIDE) 37.5-25 MG capsule; Take 1 each (1 capsule total) by mouth daily.  Dispense: 90 capsule; Refill: 1 - losartan (COZAAR) 100 MG tablet; Take 0.5 tablets (50 mg total) by mouth daily.  Dispense: 45 tablet; Refill: 1 - amLODipine (NORVASC) 10 MG tablet; Take 1 tablet (10 mg total) by mouth daily.  Dispense: 90 tablet; Refill: 1  2. Type 2 diabetes mellitus without complication, without long-term current use of insulin (HCC) Chronic.  Controlled.  Stable.  Previous A1c was 6.2.  Continue metformin 500 mg 2 tablets 1000 mg twice a day and glipizide 5 mg 1 tablet twice a day. - metFORMIN (GLUCOPHAGE) 500 MG tablet; Take 2 tablets (1,000 mg total) by mouth 2 (two) times daily with a meal.  Dispense: 360 tablet; Refill: 1 - glipiZIDE  (GLUCOTROL) 5 MG tablet; Take 1-2 tablets (5-10 mg total) by mouth 2 (two) times daily before a meal.  Dispense: 180 tablet; Refill: 1  3. Need for pneumococcal vaccination Discussed and administered - Pneumococcal conjugate vaccine 20-valent (Prevnar 20)

## 2021-01-18 DIAGNOSIS — Z96652 Presence of left artificial knee joint: Secondary | ICD-10-CM | POA: Diagnosis not present

## 2021-01-18 DIAGNOSIS — M6281 Muscle weakness (generalized): Secondary | ICD-10-CM | POA: Diagnosis not present

## 2021-01-18 DIAGNOSIS — M25662 Stiffness of left knee, not elsewhere classified: Secondary | ICD-10-CM | POA: Diagnosis not present

## 2021-01-18 DIAGNOSIS — M25562 Pain in left knee: Secondary | ICD-10-CM | POA: Diagnosis not present

## 2021-01-20 DIAGNOSIS — E119 Type 2 diabetes mellitus without complications: Secondary | ICD-10-CM | POA: Diagnosis not present

## 2021-01-20 DIAGNOSIS — E782 Mixed hyperlipidemia: Secondary | ICD-10-CM | POA: Diagnosis not present

## 2021-01-20 DIAGNOSIS — I6523 Occlusion and stenosis of bilateral carotid arteries: Secondary | ICD-10-CM | POA: Diagnosis not present

## 2021-01-20 DIAGNOSIS — I10 Essential (primary) hypertension: Secondary | ICD-10-CM | POA: Diagnosis not present

## 2021-01-20 DIAGNOSIS — G4733 Obstructive sleep apnea (adult) (pediatric): Secondary | ICD-10-CM | POA: Diagnosis not present

## 2021-02-07 DIAGNOSIS — S8002XA Contusion of left knee, initial encounter: Secondary | ICD-10-CM | POA: Diagnosis not present

## 2021-02-07 DIAGNOSIS — M25572 Pain in left ankle and joints of left foot: Secondary | ICD-10-CM | POA: Diagnosis not present

## 2021-02-07 DIAGNOSIS — Z96652 Presence of left artificial knee joint: Secondary | ICD-10-CM | POA: Diagnosis not present

## 2021-02-07 DIAGNOSIS — M25562 Pain in left knee: Secondary | ICD-10-CM | POA: Diagnosis not present

## 2021-02-08 ENCOUNTER — Ambulatory Visit: Payer: BC Managed Care – PPO | Admitting: Family Medicine

## 2021-02-20 DIAGNOSIS — E119 Type 2 diabetes mellitus without complications: Secondary | ICD-10-CM | POA: Diagnosis not present

## 2021-03-09 IMAGING — XA Imaging study
1 series · 1 of 1 positions shown · non-contrast
Comparison: none

CLINICAL DATA: Degenerative disc disease. Displacement of the L5-S1
lumbar discs. Right lower extremity radiculopathy.

[Series 2: ortho standard · 1 of 1 slices shown]
[im 1/1]
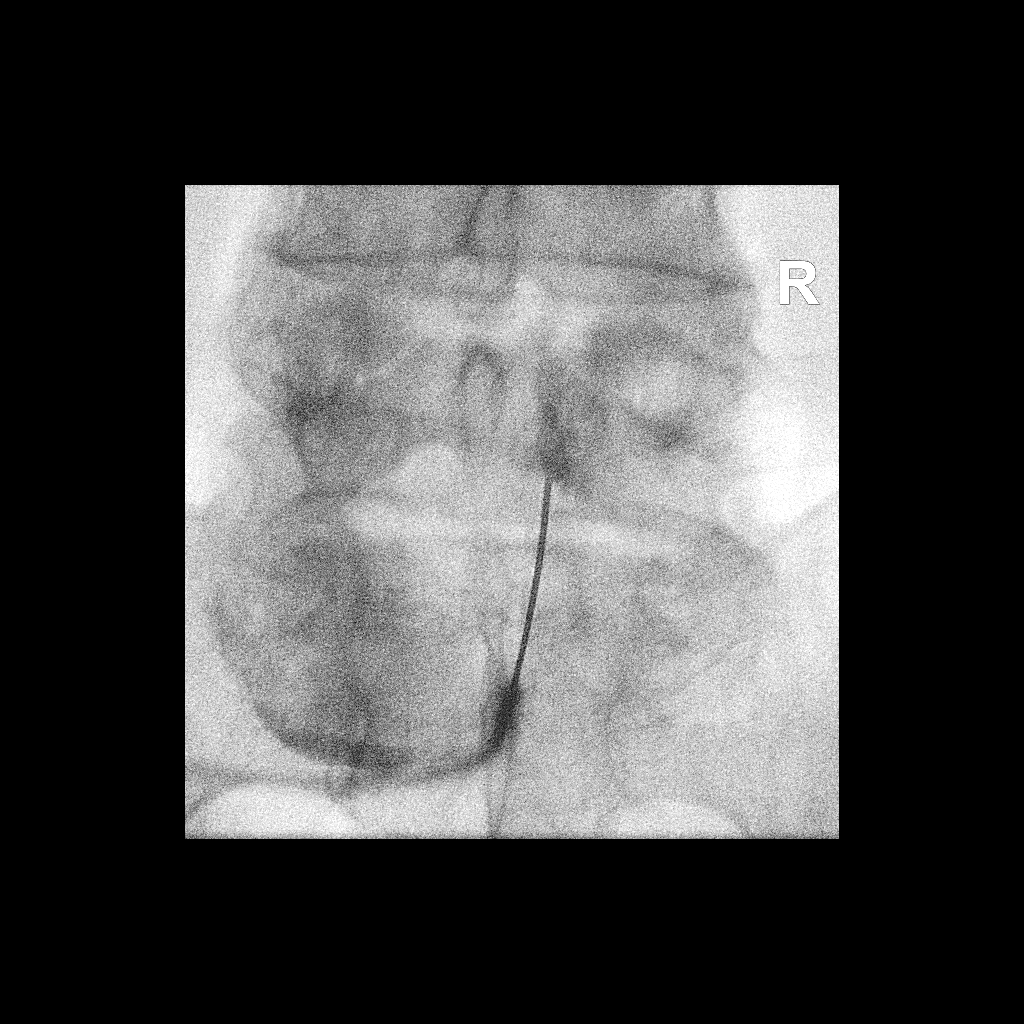

[1 of 1 positions shown; findings below may reference images not displayed]

FLUOROSCOPY TIME:  Radiation Exposure Index (as provided by the
fluoroscopic device): 33.3 uGy*m2

PROCEDURE:
The procedure, risks, benefits, and alternatives were explained to
the patient. Questions regarding the procedure were encouraged and
answered. The patient understands and consents to the procedure.

LUMBAR EPIDURAL INJECTION:

An interlaminar approach was performed on right at L4-5. The
overlying skin was cleansed and anesthetized. A 20 gauge epidural
needle was advanced using loss-of-resistance technique.

DIAGNOSTIC EPIDURAL INJECTION:

Injection of Isovue-M 200 shows a good epidural pattern with spread
above and below the level of needle placement, primarily on the
right no vascular opacification is seen.

THERAPEUTIC EPIDURAL INJECTION:

120 mg of Depo-Medrol mixed with 3 mL 1% lidocaine were instilled.
The procedure was well-tolerated, and the patient was discharged
thirty minutes following the injection in good condition.

COMPLICATIONS:
None
IMPRESSION: Technically successful epidural injection on the right L5-S1 # 1

## 2021-03-11 DIAGNOSIS — E785 Hyperlipidemia, unspecified: Secondary | ICD-10-CM | POA: Diagnosis not present

## 2021-03-11 DIAGNOSIS — E1169 Type 2 diabetes mellitus with other specified complication: Secondary | ICD-10-CM | POA: Diagnosis not present

## 2021-03-11 DIAGNOSIS — E559 Vitamin D deficiency, unspecified: Secondary | ICD-10-CM | POA: Diagnosis not present

## 2021-03-11 DIAGNOSIS — E1159 Type 2 diabetes mellitus with other circulatory complications: Secondary | ICD-10-CM | POA: Diagnosis not present

## 2021-03-11 DIAGNOSIS — E1142 Type 2 diabetes mellitus with diabetic polyneuropathy: Secondary | ICD-10-CM | POA: Diagnosis not present

## 2021-03-11 DIAGNOSIS — I152 Hypertension secondary to endocrine disorders: Secondary | ICD-10-CM | POA: Diagnosis not present

## 2021-03-18 DIAGNOSIS — E1169 Type 2 diabetes mellitus with other specified complication: Secondary | ICD-10-CM | POA: Diagnosis not present

## 2021-03-18 DIAGNOSIS — E119 Type 2 diabetes mellitus without complications: Secondary | ICD-10-CM | POA: Diagnosis not present

## 2021-03-18 DIAGNOSIS — E1159 Type 2 diabetes mellitus with other circulatory complications: Secondary | ICD-10-CM | POA: Diagnosis not present

## 2021-03-18 DIAGNOSIS — I152 Hypertension secondary to endocrine disorders: Secondary | ICD-10-CM | POA: Diagnosis not present

## 2021-03-23 DIAGNOSIS — E119 Type 2 diabetes mellitus without complications: Secondary | ICD-10-CM | POA: Diagnosis not present

## 2021-03-31 DIAGNOSIS — E1142 Type 2 diabetes mellitus with diabetic polyneuropathy: Secondary | ICD-10-CM | POA: Diagnosis not present

## 2021-03-31 DIAGNOSIS — M5412 Radiculopathy, cervical region: Secondary | ICD-10-CM | POA: Diagnosis not present

## 2021-04-15 DIAGNOSIS — M25511 Pain in right shoulder: Secondary | ICD-10-CM | POA: Diagnosis not present

## 2021-04-15 DIAGNOSIS — M542 Cervicalgia: Secondary | ICD-10-CM | POA: Diagnosis not present

## 2021-04-15 DIAGNOSIS — G5603 Carpal tunnel syndrome, bilateral upper limbs: Secondary | ICD-10-CM | POA: Diagnosis not present

## 2021-04-15 DIAGNOSIS — M5441 Lumbago with sciatica, right side: Secondary | ICD-10-CM | POA: Diagnosis not present

## 2021-04-15 DIAGNOSIS — M5412 Radiculopathy, cervical region: Secondary | ICD-10-CM | POA: Diagnosis not present

## 2021-04-19 DIAGNOSIS — M6281 Muscle weakness (generalized): Secondary | ICD-10-CM | POA: Diagnosis not present

## 2021-04-19 DIAGNOSIS — M25511 Pain in right shoulder: Secondary | ICD-10-CM | POA: Diagnosis not present

## 2021-04-19 DIAGNOSIS — M25611 Stiffness of right shoulder, not elsewhere classified: Secondary | ICD-10-CM | POA: Diagnosis not present

## 2021-04-20 DIAGNOSIS — E119 Type 2 diabetes mellitus without complications: Secondary | ICD-10-CM | POA: Diagnosis not present

## 2021-04-26 DIAGNOSIS — G5602 Carpal tunnel syndrome, left upper limb: Secondary | ICD-10-CM | POA: Diagnosis not present

## 2021-04-26 DIAGNOSIS — H8120 Vestibular neuronitis, unspecified ear: Secondary | ICD-10-CM | POA: Diagnosis not present

## 2021-04-26 DIAGNOSIS — M47812 Spondylosis without myelopathy or radiculopathy, cervical region: Secondary | ICD-10-CM | POA: Diagnosis not present

## 2021-04-26 DIAGNOSIS — G939 Disorder of brain, unspecified: Secondary | ICD-10-CM | POA: Diagnosis not present

## 2021-04-28 DIAGNOSIS — M6281 Muscle weakness (generalized): Secondary | ICD-10-CM | POA: Diagnosis not present

## 2021-04-28 DIAGNOSIS — M25511 Pain in right shoulder: Secondary | ICD-10-CM | POA: Diagnosis not present

## 2021-04-28 DIAGNOSIS — M25611 Stiffness of right shoulder, not elsewhere classified: Secondary | ICD-10-CM | POA: Diagnosis not present

## 2021-05-03 DIAGNOSIS — M25511 Pain in right shoulder: Secondary | ICD-10-CM | POA: Diagnosis not present

## 2021-05-03 DIAGNOSIS — M6281 Muscle weakness (generalized): Secondary | ICD-10-CM | POA: Diagnosis not present

## 2021-05-03 DIAGNOSIS — M25611 Stiffness of right shoulder, not elsewhere classified: Secondary | ICD-10-CM | POA: Diagnosis not present

## 2021-05-04 DIAGNOSIS — G5602 Carpal tunnel syndrome, left upper limb: Secondary | ICD-10-CM | POA: Diagnosis not present

## 2021-05-05 DIAGNOSIS — M25511 Pain in right shoulder: Secondary | ICD-10-CM | POA: Diagnosis not present

## 2021-05-05 DIAGNOSIS — M6281 Muscle weakness (generalized): Secondary | ICD-10-CM | POA: Diagnosis not present

## 2021-05-05 DIAGNOSIS — M25611 Stiffness of right shoulder, not elsewhere classified: Secondary | ICD-10-CM | POA: Diagnosis not present

## 2021-05-09 DIAGNOSIS — M6281 Muscle weakness (generalized): Secondary | ICD-10-CM | POA: Diagnosis not present

## 2021-05-09 DIAGNOSIS — M25611 Stiffness of right shoulder, not elsewhere classified: Secondary | ICD-10-CM | POA: Diagnosis not present

## 2021-05-09 DIAGNOSIS — M25511 Pain in right shoulder: Secondary | ICD-10-CM | POA: Diagnosis not present

## 2021-05-09 DIAGNOSIS — M25562 Pain in left knee: Secondary | ICD-10-CM | POA: Diagnosis not present

## 2021-05-12 DIAGNOSIS — M25511 Pain in right shoulder: Secondary | ICD-10-CM | POA: Diagnosis not present

## 2021-05-12 DIAGNOSIS — M25611 Stiffness of right shoulder, not elsewhere classified: Secondary | ICD-10-CM | POA: Diagnosis not present

## 2021-05-12 DIAGNOSIS — M6281 Muscle weakness (generalized): Secondary | ICD-10-CM | POA: Diagnosis not present

## 2021-05-16 DIAGNOSIS — M6281 Muscle weakness (generalized): Secondary | ICD-10-CM | POA: Diagnosis not present

## 2021-05-16 DIAGNOSIS — M25611 Stiffness of right shoulder, not elsewhere classified: Secondary | ICD-10-CM | POA: Diagnosis not present

## 2021-05-16 DIAGNOSIS — M25511 Pain in right shoulder: Secondary | ICD-10-CM | POA: Diagnosis not present

## 2021-05-21 DIAGNOSIS — E119 Type 2 diabetes mellitus without complications: Secondary | ICD-10-CM | POA: Diagnosis not present

## 2021-06-20 DIAGNOSIS — E119 Type 2 diabetes mellitus without complications: Secondary | ICD-10-CM | POA: Diagnosis not present

## 2021-07-19 ENCOUNTER — Telehealth: Payer: Self-pay

## 2021-07-19 ENCOUNTER — Ambulatory Visit (INDEPENDENT_AMBULATORY_CARE_PROVIDER_SITE_OTHER): Payer: BC Managed Care – PPO | Admitting: Family Medicine

## 2021-07-19 ENCOUNTER — Encounter: Payer: Self-pay | Admitting: Family Medicine

## 2021-07-19 ENCOUNTER — Other Ambulatory Visit: Payer: Self-pay | Admitting: Family Medicine

## 2021-07-19 VITALS — BP 120/80 | HR 80 | Ht 73.0 in | Wt 253.0 lb

## 2021-07-19 DIAGNOSIS — F419 Anxiety disorder, unspecified: Secondary | ICD-10-CM

## 2021-07-19 DIAGNOSIS — K219 Gastro-esophageal reflux disease without esophagitis: Secondary | ICD-10-CM

## 2021-07-19 DIAGNOSIS — E119 Type 2 diabetes mellitus without complications: Secondary | ICD-10-CM | POA: Diagnosis not present

## 2021-07-19 DIAGNOSIS — F3341 Major depressive disorder, recurrent, in partial remission: Secondary | ICD-10-CM

## 2021-07-19 DIAGNOSIS — I10 Essential (primary) hypertension: Secondary | ICD-10-CM

## 2021-07-19 DIAGNOSIS — Z1211 Encounter for screening for malignant neoplasm of colon: Secondary | ICD-10-CM

## 2021-07-19 DIAGNOSIS — L309 Dermatitis, unspecified: Secondary | ICD-10-CM

## 2021-07-19 DIAGNOSIS — Z8659 Personal history of other mental and behavioral disorders: Secondary | ICD-10-CM

## 2021-07-19 DIAGNOSIS — E7801 Familial hypercholesterolemia: Secondary | ICD-10-CM

## 2021-07-19 MED ORDER — METFORMIN HCL 500 MG PO TABS: 1000.0000 mg | ORAL_TABLET | Freq: Two times a day (BID) | ORAL | 1 refills | Status: DC

## 2021-07-19 MED ORDER — TRIAMCINOLONE ACETONIDE 0.1 % EX CREA: 1.0000 "application " | TOPICAL_CREAM | Freq: Two times a day (BID) | CUTANEOUS | 0 refills | Status: DC

## 2021-07-19 MED ORDER — LOSARTAN POTASSIUM 100 MG PO TABS: 50.0000 mg | ORAL_TABLET | Freq: Every day | ORAL | 1 refills | Status: DC

## 2021-07-19 MED ORDER — SERTRALINE HCL 50 MG PO TABS: 50.0000 mg | ORAL_TABLET | Freq: Every day | ORAL | 1 refills | Status: DC

## 2021-07-19 MED ORDER — BUPROPION HCL 75 MG PO TABS: 75.0000 mg | ORAL_TABLET | Freq: Two times a day (BID) | ORAL | 1 refills | Status: DC

## 2021-07-19 MED ORDER — ROSUVASTATIN CALCIUM 10 MG PO TABS: 10.0000 mg | ORAL_TABLET | Freq: Every day | ORAL | 1 refills | Status: DC

## 2021-07-19 MED ORDER — PANTOPRAZOLE SODIUM 40 MG PO TBEC: 40.0000 mg | DELAYED_RELEASE_TABLET | Freq: Every day | ORAL | 1 refills | Status: DC

## 2021-07-19 MED ORDER — AMLODIPINE BESYLATE 10 MG PO TABS: 10.0000 mg | ORAL_TABLET | Freq: Every day | ORAL | 1 refills | Status: DC

## 2021-07-19 MED ORDER — GLIPIZIDE 5 MG PO TABS: 5.0000 mg | ORAL_TABLET | Freq: Two times a day (BID) | ORAL | 1 refills | Status: DC

## 2021-07-19 NOTE — Patient Instructions (Signed)

## 2021-07-19 NOTE — Telephone Encounter (Signed)
CALLED NO ANSWER NO VOICEMAIL

## 2021-07-19 NOTE — Progress Notes (Signed)
Date:  07/19/2021   Name:  Raymond [name]   DOB:  April 28, 1955   MRN:  [id number]   Chief Complaint: Depression, Hypertension, Hyperlipidemia, Gastroesophageal Reflux, and Diabetes  Depression        This is a chronic problem.  The current episode started more than 1 year ago.   The problem occurs intermittently.  The problem has been gradually improving since onset.  Associated symptoms include no decreased concentration, no fatigue, no helplessness, no hopelessness, does not have insomnia, not irritable, no restlessness, no decreased interest, no appetite change, no body aches, no myalgias, no headaches, no indigestion, not sad and no suicidal ideas.     The symptoms are aggravated by nothing.  Past treatments include SSRIs - Selective serotonin reuptake inhibitors.   Pertinent negatives include no hypothyroidism. Hypertension This is a chronic problem. The current episode started more than 1 year ago. The problem is unchanged. The problem is controlled. Pertinent negatives include no blurred vision, chest pain, headaches, neck pain, orthopnea, palpitations, peripheral edema, PND or shortness of breath. Risk factors for coronary artery disease include dyslipidemia. Past treatments include angiotensin blockers and calcium channel blockers. The current treatment provides moderate improvement. There are no compliance problems.  There is no history of angina, kidney disease, CAD/MI, CVA, heart failure, left ventricular hypertrophy, PVD or retinopathy. There is no history of chronic renal disease.  Hyperlipidemia This is a chronic problem. The current episode started more than 1 year ago. The problem is controlled. Recent lipid tests were reviewed and are normal. He has no history of chronic renal disease, diabetes, hypothyroidism, liver disease, obesity or nephrotic syndrome. Pertinent negatives include no chest pain, focal weakness, myalgias or shortness of breath. Current antihyperlipidemic treatment  includes statins. The current treatment provides significant improvement of lipids.  Gastroesophageal Reflux He reports no abdominal pain, no chest pain, no coughing, no dysphagia, no heartburn, no nausea, no sore throat or no wheezing. This is a chronic problem. The current episode started more than 1 year ago. The problem has been gradually improving. Pertinent negatives include no fatigue or weight loss. The treatment provided moderate relief.  Diabetes There are no hypoglycemic associated symptoms. Pertinent negatives for hypoglycemia include no dizziness, headaches or nervousness/anxiousness. Pertinent negatives for diabetes include no blurred vision, no chest pain, no fatigue, no foot paresthesias, no polydipsia, no polyphagia, no polyuria, no weakness and no weight loss. There are no hypoglycemic complications. Symptoms are stable. There are no diabetic complications. Pertinent negatives for diabetic complications include no CVA, PVD or retinopathy. Risk factors for coronary artery disease include dyslipidemia and hypertension. Current diabetic treatment includes oral agent (dual therapy). He is following a generally healthy diet. Meal planning includes avoidance of concentrated sweets and carbohydrate counting. He participates in exercise daily. An ACE inhibitor/angiotensin II receptor blocker is being taken.   Lab Results  Component Value Date   NA 139 11/01/2020   K 3.9 11/01/2020   CO2 27 11/01/2020   GLUCOSE 118 (H) 11/01/2020   BUN 26 (H) 11/01/2020   CREATININE 0.97 11/01/2020   CALCIUM 9.8 11/01/2020   EGFR 95 07/12/2020   GFRNONAA >60 11/01/2020   Lab Results  Component Value Date   CHOL 143 07/12/2020   HDL 46 07/12/2020   LDLCALC 81 07/12/2020   TRIG 84 07/12/2020   CHOLHDL 3.3 07/24/2016   Lab Results  Component Value Date   TSH 2.620 11/07/2018   Lab Results  Component Value Date   HGBA1C 7.8 (  H) 07/12/2020   Lab Results  Component Value Date   WBC 7.8  11/01/2020   HGB 14.9 11/01/2020   HCT 43.9 11/01/2020   MCV 86.4 11/01/2020   PLT 198 11/01/2020   Lab Results  Component Value Date   ALT 16 11/01/2020   AST 17 11/01/2020   ALKPHOS 64 11/01/2020   BILITOT 1.3 (H) 11/01/2020   No results found for: 25OHVITD2, 25OHVITD3, VD25OH   Review of Systems  Constitutional:  Negative for appetite change, chills, fatigue, fever and weight loss.  HENT:  Negative for drooling, ear discharge, ear pain and sore throat.   Eyes:  Negative for blurred vision.  Respiratory:  Negative for cough, shortness of breath and wheezing.   Cardiovascular:  Negative for chest pain, palpitations, orthopnea, leg swelling and PND.  Gastrointestinal:  Negative for abdominal pain, blood in stool, constipation, diarrhea, dysphagia, heartburn and nausea.  Endocrine: Negative for polydipsia, polyphagia and polyuria.  Genitourinary:  Negative for dysuria, frequency, hematuria and urgency.  Musculoskeletal:  Negative for back pain, myalgias and neck pain.  Skin:  Positive for rash.  Allergic/Immunologic: Negative for environmental allergies.  Neurological:  Negative for dizziness, focal weakness, weakness and headaches.  Hematological:  Does not bruise/bleed easily.  Psychiatric/Behavioral:  Positive for depression. Negative for decreased concentration and suicidal ideas. The patient is not nervous/anxious and does not have insomnia.    Patient Active Problem List   Diagnosis Date Noted   Varicose veins of leg with pain, left 12/30/2018   Carpal tunnel syndrome, right 08/09/2018   Primary osteoarthritis of left shoulder 08/09/2018   Rotator cuff tendinitis, left 08/09/2018   Strain of left knee 08/06/2017   Obesity (BMI 35.0-39.9 without comorbidity) 08/03/2017   Status post arthroscopy of left knee 08/03/2017   Complex tear of medial meniscus of left knee as current injury 07/26/2017   Chest pain 07/24/2016   Uncontrolled type 2 diabetes mellitus without  complication, without long-term current use of insulin 07/24/2016   Status post total hip replacement, left 01/25/2016   Benign essential HTN 07/21/2015   Depression 07/21/2015   Hyperlipidemia, mixed 07/21/2015   GERD (gastroesophageal reflux disease) 12/04/2014   Biceps tendinitis 07/21/2014   Impingement syndrome of shoulder 07/21/2014   Primary osteoarthritis of right knee 08/14/2013   Neuritis or radiculitis due to rupture of lumbar intervertebral disc 08/14/2013    No Known Allergies  Past Surgical History:  Procedure Laterality Date   CARPAL TUNNEL RELEASE Right 01/02/2019   Procedure: CARPAL TUNNEL RELEASE ENDOSCOPIC;  Surgeon: Corky Mull, MD;  Location: [facility];  Service: Orthopedics;  Laterality: Right;   CHOLECYSTECTOMY     CHONDROPLASTY  10/05/09   knee- shaving patellofemoral joint   COLONOSCOPY  2009?   ENDOVENOUS ABLATION SAPHENOUS VEIN W/ LASER     INJECTION KNEE Left 12/23/2019   Procedure: KNEE INJECTION;  Surgeon: Corky Mull, MD;  Location: [facility];  Service: Orthopedics;  Laterality: Left;   JOINT REPLACEMENT Left    hip   KNEE ARTHROSCOPY Left 07/25/2017   Procedure: Arthroscopic partial medial meniscectomy; arthroscopic debridement; and arthroscopic abrasion chondroplasty of medial femoral condyle, lateral tibial plateau, and femoral trochlea; left knee.;  Surgeon: Corky Mull, MD;  Location: [place];  Service: Orthopedics;  Laterality: Left;   KNEE ARTHROSCOPY WITH MEDIAL MENISECTOMY Right    Partial medial and lateral menisectomy   SHOULDER ARTHROSCOPY Left 08/10/2014   Procedure: ARTHROSCOPY SHOULDER WITH LIMITED DEBRIDEMENT, REMOVAL OF LOOSE BODY AND RELEASE OF  LONG END BICEPS TENDON;  Surgeon: Leanor Kail, MD;  Location: [place];  Service: Orthopedics;  Laterality: Left;   TOTAL HIP ARTHROPLASTY Left 01/25/2016   Procedure: TOTAL HIP ARTHROPLASTY;  Surgeon: Corky Mull, MD;  Location: [facility];  Service: Orthopedics;   Laterality: Left;   TOTAL KNEE ARTHROPLASTY Right 12/23/2019   Procedure: TOTAL KNEE ARTHROPLASTY;  Surgeon: Corky Mull, MD;  Location: [facility];  Service: Orthopedics;  Laterality: Right;   TOTAL KNEE ARTHROPLASTY Left 11/11/2020   Procedure: TOTAL KNEE ARTHROPLASTY;  Surgeon: Corky Mull, MD;  Location: [facility];  Service: Orthopedics;  Laterality: Left;    Social History   Tobacco Use   Smoking status: Never   Smokeless tobacco: Never  Vaping Use   Vaping Use: Never used  Substance Use Topics   Alcohol use: Yes    Comment: rare   Drug use: No     Medication list has been reviewed and updated.  Current Meds  Medication Sig   ACCU-CHEK AVIVA PLUS test strip TEST AS DIRECTED   amLODipine (NORVASC) 10 MG tablet Take 1 tablet (10 mg total) by mouth daily.   buPROPion (WELLBUTRIN) 75 MG tablet Take 1 tablet (75 mg total) by mouth 2 (two) times daily.   cyclobenzaprine (FLEXERIL) 10 MG tablet Take 10 mg by mouth 2 (two) times daily as needed.   fluticasone (FLONASE) 50 MCG/ACT nasal spray Place 2 sprays into both nostrils daily as needed for allergies or rhinitis.   gabapentin (NEURONTIN) 300 MG capsule 3 capsules tid (Patient taking differently: Take 900 mg by mouth 3 (three) times daily.)   glipiZIDE (GLUCOTROL) 5 MG tablet Take 1-2 tablets (5-10 mg total) by mouth 2 (two) times daily before a meal.   Glucosamine-Chondroit-Vit C-Mn (GLUCOSAMINE CHONDR 1500 COMPLX PO) Take 1,500 mg by mouth daily. msm   losartan (COZAAR) 100 MG tablet Take 0.5 tablets (50 mg total) by mouth daily.   metFORMIN (GLUCOPHAGE) 500 MG tablet Take 2 tablets (1,000 mg total) by mouth 2 (two) times daily with a meal.   pantoprazole (PROTONIX) 40 MG tablet TAKE 1 TABLET BY MOUTH EVERY DAY   rosuvastatin (CRESTOR) 10 MG tablet TAKE 1 TABLET BY MOUTH EVERY DAY   sertraline (ZOLOFT) 50 MG tablet TAKE 1 TABLET BY MOUTH EVERY DAY   triamterene-hydrochlorothiazide (DYAZIDE) 37.5-25 MG capsule Take 1 each (1  capsule total) by mouth daily.       07/19/2021    9:16 AM 01/17/2021    1:45 PM 07/12/2020   10:14 AM 12/22/2019    3:16 PM  GAD 7 : Generalized Anxiety Score  Nervous, Anxious, on Edge 0 0 0 0  Control/stop worrying 0 0 0 0  Worry too much - different things 0 0 0 0  Trouble relaxing 0 0 0 0  Restless 0 0 0 0  Easily annoyed or irritable 0 1 0 0  Afraid - awful might happen 0 0 0 0  Total GAD 7 Score 0 1 0 0  Anxiety Difficulty Not difficult at all          07/19/2021    9:16 AM  Depression screen PHQ 2/9  Decreased Interest 0  Down, Depressed, Hopeless 0  PHQ - 2 Score 0  Altered sleeping 0  Tired, decreased energy 0  Change in appetite 0  Feeling bad or failure about yourself  0  Trouble concentrating 0  Moving slowly or fidgety/restless 0  Suicidal thoughts 0  PHQ-9 Score 0  Difficult doing work/chores Not difficult at all    BP Readings from Last 3 Encounters:  07/19/21 120/80  01/17/21 122/78  11/11/20 135/74    Physical Exam Vitals and nursing note reviewed.  Constitutional:      General: He is not irritable. HENT:     Head: Normocephalic.     Right Ear: Tympanic membrane and external ear normal.     Left Ear: Tympanic membrane and external ear normal.     Nose: Nose normal. No congestion.  Eyes:     General: No scleral icterus.       Right eye: No discharge.        Left eye: No discharge.     Conjunctiva/sclera: Conjunctivae normal.     Pupils: Pupils are equal, round, and reactive to light.  Neck:     Thyroid: No thyromegaly or thyroid tenderness.     Vascular: Normal carotid pulses. No JVD.     Trachea: No tracheal deviation.  Cardiovascular:     Rate and Rhythm: Normal rate and regular rhythm.     Heart sounds: Normal heart sounds, S1 normal and S2 normal. No murmur heard. No systolic murmur is present.  No diastolic murmur is present.    No friction rub. No gallop. No S3 or S4 sounds.  Pulmonary:     Effort: No respiratory distress.      Breath sounds: Normal breath sounds. No decreased breath sounds, wheezing, rhonchi or rales.  Chest:     Chest wall: No tenderness.  Abdominal:     General: Bowel sounds are normal.     Palpations: Abdomen is soft. There is no hepatomegaly, splenomegaly or mass.     Tenderness: There is no abdominal tenderness. There is no guarding or rebound.  Musculoskeletal:        General: No tenderness. Normal range of motion.     Cervical back: Normal range of motion and neck supple.     Right lower leg: No edema.     Left lower leg: No edema.  Lymphadenopathy:     Cervical: No cervical adenopathy.     Right cervical: No superficial, deep or posterior cervical adenopathy.    Left cervical: No superficial, deep or posterior cervical adenopathy.  Skin:    General: Skin is warm.     Findings: No rash.  Neurological:     Mental Status: He is alert and oriented to person, place, and time.     Cranial Nerves: No cranial nerve deficit.     Deep Tendon Reflexes: Reflexes are normal and symmetric.    Wt Readings from Last 3 Encounters:  07/19/21 253 lb (114.8 kg)  01/17/21 257 lb (116.6 kg)  11/11/20 257 lb 15 oz (117 kg)    BP 120/80   Pulse 80   Ht 6' 1"  (1.854 m)   Wt 253 lb (114.8 kg)   BMI 33.38 kg/m   Assessment and Plan:  1. Essential hypertension Chronic.  Controlled.  Stable.  Blood pressure 120/80.  Continue amlodipine 10 mg once a day and losartan 100 mg 1/2 tablet once a day. - amLODipine (NORVASC) 10 MG tablet; Take 1 tablet (10 mg total) by mouth daily.  Dispense: 90 tablet; Refill: 1 - losartan (COZAAR) 100 MG tablet; Take 0.5 tablets (50 mg total) by mouth daily.  Dispense: 45 tablet; Refill: 1  2. Recurrent major depressive disorder, in partial remission (HCC) Chronic.  Controlled.  Stable.  PHQ 0 GAD score 0 continue Wellbutrin 75 mg  twice a day and sertraline 50 mg once a day. - buPROPion (WELLBUTRIN) 75 MG tablet; Take 1 tablet (75 mg total) by mouth 2 (two) times  daily.  Dispense: 180 tablet; Refill: 1 - sertraline (ZOLOFT) 50 MG tablet; Take 1 tablet (50 mg total) by mouth daily.  Dispense: 90 tablet; Refill: 1  3. Type 2 diabetes mellitus without complication, without long-term current use of insulin (HCC) Chronic.  Controlled.  Stable.  Continue glipizide 5 mg 1 tablet twice a day and metformin 500 mg twice a day.  We will check A1c for current level of control. - glipiZIDE (GLUCOTROL) 5 MG tablet; Take 1-2 tablets (5-10 mg total) by mouth 2 (two) times daily before a meal.  Dispense: 180 tablet; Refill: 1 - metFORMIN (GLUCOPHAGE) 500 MG tablet; Take 2 tablets (1,000 mg total) by mouth 2 (two) times daily with a meal.  Dispense: 360 tablet; Refill: 1 - Hemoglobin A1c  4. Gastroesophageal reflux disease, unspecified whether esophagitis present Chronic.  Controlled.  Stable.  Continue pantoprazole 40 mg once a day. - pantoprazole (PROTONIX) 40 MG tablet; Take 1 tablet (40 mg total) by mouth daily.  Dispense: 90 tablet; Refill: 1  5. Familial hypercholesterolemia Chronic.  Controlled.  Stable.  Continue rosuvastatin 10 mg once a day.  Review of previous lipid panel is acceptable. - rosuvastatin (CRESTOR) 10 MG tablet; Take 1 tablet (10 mg total) by mouth daily.  Dispense: 90 tablet; Refill: 1  6. History of posttraumatic stress disorder (PTSD) Chronic.  Controlled.  Stable.  PHQ is 0.  Continue sertraline 50 mg once a day. - sertraline (ZOLOFT) 50 MG tablet; Take 1 tablet (50 mg total) by mouth daily.  Dispense: 90 tablet; Refill: 1  7. Anxiety Chronic.  Controlled.  Stable.  GAD score is 0.  Continue sertraline 50 mg once a day. - sertraline (ZOLOFT) 50 MG tablet; Take 1 tablet (50 mg total) by mouth daily.  Dispense: 90 tablet; Refill: 1  8. Dermatitis New onset.  Persistent.  Small areas of breakout from exposure to poison oak.  Will treat with triamcinolone cream as needed. - triamcinolone cream (KENALOG) 0.1 %; Apply 1 application. topically 2  (two) times daily.  Dispense: 30 g; Refill: 0  9. Colon cancer screening Discussed with patient and colon cancer screening appointment set up with gastroenterology. - Ambulatory referral to Gastroenterology

## 2021-07-20 LAB — HEMOGLOBIN A1C
Est. average glucose Bld gHb Est-mCnc: 137 mg/dL
Hgb A1c MFr Bld: 6.4 % — ABNORMAL HIGH (ref 4.8–5.6)

## 2021-07-21 ENCOUNTER — Telehealth: Payer: Self-pay

## 2021-07-21 DIAGNOSIS — E119 Type 2 diabetes mellitus without complications: Secondary | ICD-10-CM | POA: Diagnosis not present

## 2021-07-21 NOTE — Telephone Encounter (Signed)
Called no answer no voicemail sent letter

## 2021-08-19 DIAGNOSIS — I152 Hypertension secondary to endocrine disorders: Secondary | ICD-10-CM | POA: Diagnosis not present

## 2021-08-19 DIAGNOSIS — E1159 Type 2 diabetes mellitus with other circulatory complications: Secondary | ICD-10-CM | POA: Diagnosis not present

## 2021-08-19 DIAGNOSIS — E1169 Type 2 diabetes mellitus with other specified complication: Secondary | ICD-10-CM | POA: Diagnosis not present

## 2021-08-19 DIAGNOSIS — E1142 Type 2 diabetes mellitus with diabetic polyneuropathy: Secondary | ICD-10-CM | POA: Diagnosis not present

## 2021-08-20 DIAGNOSIS — E119 Type 2 diabetes mellitus without complications: Secondary | ICD-10-CM | POA: Diagnosis not present

## 2021-08-22 DIAGNOSIS — E669 Obesity, unspecified: Secondary | ICD-10-CM | POA: Diagnosis not present

## 2021-08-22 DIAGNOSIS — M19011 Primary osteoarthritis, right shoulder: Secondary | ICD-10-CM | POA: Diagnosis not present

## 2021-08-22 DIAGNOSIS — M25511 Pain in right shoulder: Secondary | ICD-10-CM | POA: Diagnosis not present

## 2021-08-22 DIAGNOSIS — E1165 Type 2 diabetes mellitus with hyperglycemia: Secondary | ICD-10-CM | POA: Diagnosis not present

## 2021-08-26 DIAGNOSIS — H524 Presbyopia: Secondary | ICD-10-CM | POA: Diagnosis not present

## 2021-08-26 DIAGNOSIS — H43813 Vitreous degeneration, bilateral: Secondary | ICD-10-CM | POA: Diagnosis not present

## 2021-08-26 LAB — HM DIABETES EYE EXAM

## 2021-09-06 DIAGNOSIS — Z1211 Encounter for screening for malignant neoplasm of colon: Secondary | ICD-10-CM | POA: Diagnosis not present

## 2021-09-07 ENCOUNTER — Other Ambulatory Visit: Payer: Self-pay | Admitting: General Surgery

## 2021-09-07 NOTE — Progress Notes (Signed)
Progress Notes - documented in this encounter Letia Guidry, Geronimo Boot, MD - 09/06/2021 3:30 PM EDT Formatting of this note is different from the original. Images from the original note were not included. Subjective:   Patient ID: Raymond [name] is a 65 y.o. male.  HPI  The following portions of the patient's history were reviewed and updated as appropriate.  This a new patient is here today for: office visit. Here to discuss having a colonoscopy, last on 04-16-2008 by Dr Allen Norris. He states his bowels move regular and no bleeding.  Review of Systems  Constitutional: Negative for chills and fever.  Respiratory: Negative for cough.   Chief Complaint  Patient presents with  Pre-op Exam    BP 132/68  Pulse 58  Temp 36.2 C (97.1 F)  Ht 185.4 cm (_0 )  Wt (!) 115.7 kg (255 lb)  SpO2 95%  BMI 33.64 kg/m   Past Medical History:  Diagnosis Date  Arthrosis of knee 08/14/2013  Benign essential HTN 07/26/2016  Bicipital tendinitis of left shoulder 07/21/2014  Chickenpox  Depression  Diabetes mellitus type 2, uncomplicated (CMS-HCC)  GERD (gastroesophageal reflux disease) 00/00/0000  Hyperlipidemia, mixed 07/26/2016  Hypertension  Impingement syndrome of left shoulder 07/21/2014  Lumbar radiculitis 08/14/2013  Primary osteoarthritis of left hip 11/03/2015  Primary osteoarthritis of right knee 01/24/2017  Sleep apnea  Stable angina pectoris (CMS-HCC) 07/26/2016  Status post total hip replacement, left 01/26/2016  Stroke (CMS-HCC)  Uncontrolled type 2 diabetes mellitus without complication, without long-term current use of insulin 09/12/2016    Past Surgical History:  Procedure Laterality Date  CHOLECYSTECTOMY 1988  Dr Bary Castilla  COLONOSCOPY 04/16/2008  Dr. Allen Norris  KNEE ARTHROSCOPY Right 10/05/2009  DIAGNOSTIC AND OPERATIVE ARTHROSCOPY WITH PARTIAL MEDIAL AND LATERAL MENISECTOMY, DR.CALIFF---  KNEE CHONDROPLASTIC SHAVING PATELLOFEMORAL JOINT 10/05/2009  Arthroscopic  debridement of chondral lesions left shoulder along with arthroscopic removal of loose body and arthroscopic release of the long head of the biceps tendon Left 08/10/2014  Dr.kernodle  left shoulder Left 08/10/2014  JOINT REPLACEMENT Left 01/25/2016  Total hip  Arthroscopic partial medial menscectomy, arthroscopic debridement, and arthroscopic abrasion chondroplasty of medial femoral condyle, lateral tibial plateau, and femoral trochlea,left knee Left 07/25/2017  Dr.Poggi  CARPAL TUNNEL RELEASE 12/22/2018  Endoscopic right carpal tunnel release. Right 01/02/2019  DR.Poggi  1. Right TKA using all-cemented [employer] system with a 75 mm PCR femur, a 79 mm tibial tray with a 10 mm anterior stabilized E-poly insert, and a 37 x 10 mm all-poly 3-pegged domed patella. Right 12/23/2019  Dr. Roland Rack  Left TKA using all-cemented [employer] system with a 75 mm PCR femur, a 79 mm tibial tray with a 12 mm anterior stabilized E-poly insert, and a 37 x 8.6 mm all-poly 3-pegged domed patella Left 11/11/2020  Dr.Poggi  ENDOVENOUS ABLATION SAPHENOUS VEIN W/ LASER    Social History   Socioeconomic History  Marital status: Married  Number of children: 2  Occupational History  Occupation: Garment/textile technologist  Tobacco Use  Smoking status: Never  Smokeless tobacco: Never  Vaping Use  Vaping Use: Never used  Substance and Sexual Activity  Alcohol use: Not Currently  Comment: once in a while  Drug use: Never  Sexual activity: Yes  Partners: Female  Birth control/protection: Surgical, None    No Known Allergies  Current Outpatient Medications  Medication Sig Dispense Refill  amLODIPine (NORVASC) 10 MG tablet Take 1 tablet (10 mg total) by mouth once daily  amoxicillin (AMOXIL) 500 MG capsule TAKE 4 CAPSULES  BY MOUTH 1 HOUR PRIOR TO DENTAL TREATMENT  aspirin 81 MG EC tablet Take 1 tablet (81 mg total) by mouth once daily  buPROPion (WELLBUTRIN) 75 MG tablet Take 1 tablet (75 mg total) by mouth 2 (two)  times daily  cyclobenzaprine (FLEXERIL) 10 MG tablet TAKE 1 TABLET BY MOUTH TWICE A DAY AS NEEDED FOR MUSCLE SPASM 60 tablet 11  gabapentin (NEURONTIN) 300 MG capsule Take 3 capsules (900 mg total) by mouth 3 (three) times daily 810 capsule 3  glipiZIDE (GLUCOTROL) 5 MG tablet Take two tablets (10 mg total) twice daily 90 tablet 5  losartan (COZAAR) 100 MG tablet TAKE 1 TABLET BY MOUTH EVERY DAY 90 tablet 3  meclizine (ANTIVERT) 25 mg tablet TAKE 1/2 TO 1 TABLET BY MOUTH EVERY 8 HOURS AS NEEDED FOR DIZZINESS  metFORMIN (GLUCOPHAGE) 500 MG tablet Take two 500 mg tablets (1000 mg total) by mouth twice daily, with meals. 120 tablet 5  pantoprazole (PROTONIX) 40 MG DR tablet Take 1 tablet (40 mg total) by mouth once daily  promethazine (PHENERGAN) 25 MG tablet TAKE 1 TABLET BY MOUTH EVERY 8 HOURS AS NEEDED FOR NAUSEA  rosuvastatin (CRESTOR) 10 MG tablet Take 1 tablet (10 mg total) by mouth once daily  sertraline (ZOLOFT) 50 MG tablet Take 1 tablet (50 mg total) by mouth once daily  tiZANidine (ZANAFLEX) 2 MG tablet TAKE 1 TABLET (2 MG TOTAL) BY MOUTH NIGHTLY AS NEEDED 30 tablet 1  traMADoL (ULTRAM) 50 mg tablet Take 1 tablet (50 mg total) by mouth every 6 (six) hours as needed for Pain for up to 30 doses 30 tablet 0  triamterene-hydrochlorothiazide (DYAZIDE) 37.5-25 mg capsule Take 1 capsule by mouth once daily   No current facility-administered medications for this visit.   Family History  Problem Relation Age of Onset  Myocardial Infarction (Heart attack) Mother  High blood pressure (Hypertension) Mother  Clotting disorder Mother  Coronary Artery Disease (Blocked arteries around heart) Mother  Dementia Mother  Stroke Mother  High blood pressure (Hypertension) Father  Dementia Father  Myocardial Infarction (Heart attack) Brother  Alcohol abuse Brother  No Known Problems Maternal Grandmother  No Known Problems Maternal Grandfather  No Known Problems Paternal Grandmother  Diabetes Paternal  Grandfather  No Known Problems Daughter  Asthma Son  Colon cancer Neg Hx  Breast cancer Neg Hx   Labs and Radiology:   April 16, 2008 colonoscopy report:  Diet reticulosis of the sigmoid colon. Lucilla Lame, MD)  HBG A1c:  (07/19/21) 1 yr ago (07/12/20) 1 yr ago (12/16/19) 2 yr ago (04/30/19) 2 yr ago (11/07/18) 5 yr ago (07/24/16) 6 yr ago (07/21/15)  Hgb A1c MFr Bld 4.8 - 5.6 % 6.4 High 7.8 High CM 6.9 High CM 9.3 High CM 6.8 High CM 8.0 High CM 7.4 High CM   Component Ref Range & Units 6 mo ago 2 yr ago  Glucose 70 - 110 mg/dL 175 High 184 High  Sodium 136 - 145 mmol/L 139 138  Potassium 3.6 - 5.1 mmol/L 4.1 3.7  Chloride 97 - 109 mmol/L 101 100  Carbon Dioxide (CO2) 22.0 - 32.0 mmol/L 31.1 29.5  Urea Nitrogen (BUN) 7 - 25 mg/dL 24 21  Creatinine 0.7 - 1.3 mg/dL 0.8 0.8  Glomerular Filtration Rate (eGFR), MDRD Estimate >60 mL/min/1.73sq m 97 97  Calcium 8.7 - 10.3 mg/dL 9.6 10.1  AST 8 - 39 U/L 14 12  ALT 6 - 57 U/L 13 16  Alk Phos (alkaline Phosphatase) 34 -  104 U/L 79 77  Albumin 3.5 - 4.8 g/dL 4.3 4.6  Bilirubin, Total 0.3 - 1.2 mg/dL 0.8 0.8  Protein, Total 6.1 - 7.9 g/dL 7.3 7.4  A/G Ratio 1.0 - 5.0 gm/dL 1.4 1.6    Objective:  Physical Exam Constitutional:  Appearance: Normal appearance.  Cardiovascular:  Rate and Rhythm: Normal rate and regular rhythm.  Pulses: Normal pulses.  Heart sounds: Normal heart sounds.  Pulmonary:  Effort: Pulmonary effort is normal.  Breath sounds: Normal breath sounds.  Musculoskeletal:  Cervical back: Neck supple.  Skin: General: Skin is warm and dry.  Neurological:  Mental Status: He is alert and oriented to person, place, and time.  Psychiatric:  Mood and Affect: Mood normal.  Behavior: Behavior normal.    Assessment:   Candidate for colon cancer screening.  Plan:   Options for screening were reviewed: 1) Cologuard versus 2) colonoscopy. Pros and cons, risk and benefits of each reviewed. The patient is most  comfortable with colonoscopy.  He was instructed in regards to preparation by the staff. He will hold metformin the day of prep and procedure, otherwise medications remain unchanged.   This note is partially prepared by Karie Fetch, RN, acting as a scribe in the presence of Dr. Hervey Ard, MD.  The documentation recorded by the scribe accurately reflects the service I personally performed and the decisions made by me.   Robert Bellow, MD FACS  Electronically signed by Mayer Masker, MD at 09/07/2021 7:06 AM EDT

## 2021-09-20 DIAGNOSIS — E119 Type 2 diabetes mellitus without complications: Secondary | ICD-10-CM | POA: Diagnosis not present

## 2021-09-28 DIAGNOSIS — M19012 Primary osteoarthritis, left shoulder: Secondary | ICD-10-CM | POA: Diagnosis not present

## 2021-09-28 DIAGNOSIS — M7581 Other shoulder lesions, right shoulder: Secondary | ICD-10-CM | POA: Diagnosis not present

## 2021-09-28 DIAGNOSIS — M19011 Primary osteoarthritis, right shoulder: Secondary | ICD-10-CM | POA: Diagnosis not present

## 2021-09-28 DIAGNOSIS — M7582 Other shoulder lesions, left shoulder: Secondary | ICD-10-CM | POA: Diagnosis not present

## 2021-10-12 ENCOUNTER — Ambulatory Visit
Admission: RE | Admit: 2021-10-12 | Discharge: 2021-10-12 | Disposition: A | Discharge status: Home / Self Care | Payer: BC Managed Care – PPO | Attending: General Surgery | Admitting: General Surgery

## 2021-10-12 ENCOUNTER — Ambulatory Visit: Payer: BC Managed Care – PPO | Admitting: Certified Registered"

## 2021-10-12 ENCOUNTER — Encounter
Admission: RE | Disposition: A | Discharge status: Home / Self Care | Payer: Self-pay | Source: Home / Self Care | Attending: General Surgery

## 2021-10-12 DIAGNOSIS — K219 Gastro-esophageal reflux disease without esophagitis: Secondary | ICD-10-CM | POA: Diagnosis not present

## 2021-10-12 DIAGNOSIS — E785 Hyperlipidemia, unspecified: Secondary | ICD-10-CM | POA: Insufficient documentation

## 2021-10-12 DIAGNOSIS — I10 Essential (primary) hypertension: Secondary | ICD-10-CM | POA: Insufficient documentation

## 2021-10-12 DIAGNOSIS — K635 Polyp of colon: Secondary | ICD-10-CM | POA: Diagnosis not present

## 2021-10-12 DIAGNOSIS — Z7984 Long term (current) use of oral hypoglycemic drugs: Secondary | ICD-10-CM | POA: Diagnosis not present

## 2021-10-12 DIAGNOSIS — E1151 Type 2 diabetes mellitus with diabetic peripheral angiopathy without gangrene: Secondary | ICD-10-CM | POA: Diagnosis not present

## 2021-10-12 DIAGNOSIS — K5730 Diverticulosis of large intestine without perforation or abscess without bleeding: Secondary | ICD-10-CM | POA: Diagnosis not present

## 2021-10-12 DIAGNOSIS — D120 Benign neoplasm of cecum: Secondary | ICD-10-CM | POA: Diagnosis not present

## 2021-10-12 DIAGNOSIS — D126 Benign neoplasm of colon, unspecified: Secondary | ICD-10-CM | POA: Diagnosis not present

## 2021-10-12 DIAGNOSIS — G4733 Obstructive sleep apnea (adult) (pediatric): Secondary | ICD-10-CM | POA: Diagnosis not present

## 2021-10-12 DIAGNOSIS — J4520 Mild intermittent asthma, uncomplicated: Secondary | ICD-10-CM | POA: Diagnosis not present

## 2021-10-12 DIAGNOSIS — Z1211 Encounter for screening for malignant neoplasm of colon: Secondary | ICD-10-CM | POA: Diagnosis not present

## 2021-10-12 DIAGNOSIS — Z8673 Personal history of transient ischemic attack (TIA), and cerebral infarction without residual deficits: Secondary | ICD-10-CM | POA: Insufficient documentation

## 2021-10-12 HISTORY — PX: COLONOSCOPY WITH PROPOFOL: SHX5780

## 2021-10-12 LAB — GLUCOSE, CAPILLARY: Glucose-Capillary: 169 mg/dL — ABNORMAL HIGH (ref 70–99)

## 2021-10-12 SURGERY — COLONOSCOPY WITH PROPOFOL
Anesthesia: General

## 2021-10-12 MED ORDER — SODIUM CHLORIDE 0.9 % IV SOLN: INTRAVENOUS | Status: DC | PRN

## 2021-10-12 MED ORDER — PROPOFOL 10 MG/ML IV BOLUS
INTRAVENOUS | Status: DC | PRN
  Administered 2021-10-12 (×2): 40 mg via INTRAVENOUS
  Administered 2021-10-12: 120 mg via INTRAVENOUS
  Administered 2021-10-12 (×6): 40 mg via INTRAVENOUS

## 2021-10-12 NOTE — Anesthesia Postprocedure Evaluation (Signed)
Anesthesia Post Note  Patient: Raymond [name]  Procedure(s) Performed: COLONOSCOPY WITH PROPOFOL  Patient location during evaluation: Endoscopy Anesthesia Type: General Level of consciousness: awake and alert Pain management: pain level controlled Vital Signs Assessment: post-procedure vital signs reviewed and stable Respiratory status: spontaneous breathing, nonlabored ventilation, respiratory function stable and patient connected to nasal cannula oxygen Cardiovascular status: blood pressure returned to baseline and stable Postop Assessment: no apparent nausea or vomiting Anesthetic complications: no   No notable events documented.   Last Vitals:  Vitals:   10/12/21 1009 10/12/21 1019  BP:    Pulse:    Resp:    Temp:    SpO2: 100% 100%    Last Pain:  Vitals:   10/12/21 1019  TempSrc:   PainSc: 0-No pain                 Arita Miss

## 2021-10-12 NOTE — H&P (Signed)
Raymond [name] [id number] 04-05-1955     HPI:  Healthy male for screening colonoscopy. Tolerated the prep well.   Medications Prior to Admission  Medication Sig Dispense Refill Last Dose   ACCU-CHEK AVIVA PLUS test strip TEST AS DIRECTED 50 each 0 10/11/2021   amLODipine (NORVASC) 10 MG tablet Take 1 tablet (10 mg total) by mouth daily. 90 tablet 1 10/12/2021 at 600   buPROPion (WELLBUTRIN) 75 MG tablet Take 1 tablet (75 mg total) by mouth 2 (two) times daily. 180 tablet 1 10/12/2021 at 0600   cyclobenzaprine (FLEXERIL) 10 MG tablet Take 10 mg by mouth 2 (two) times daily as needed.   10/12/2021 at prn   gabapentin (NEURONTIN) 300 MG capsule 3 capsules tid (Patient taking differently: Take 900 mg by mouth 3 (three) times daily.) 270 capsule 1 10/12/2021 at 0600   glipiZIDE (GLUCOTROL) 5 MG tablet Take 1-2 tablets (5-10 mg total) by mouth 2 (two) times daily before a meal. 180 tablet 1 Past Week   losartan (COZAAR) 100 MG tablet Take 0.5 tablets (50 mg total) by mouth daily. 45 tablet 1 10/12/2021 at 0600   metFORMIN (GLUCOPHAGE) 500 MG tablet Take 2 tablets (1,000 mg total) by mouth 2 (two) times daily with a meal. 360 tablet 1 Past Week   pantoprazole (PROTONIX) 40 MG tablet Take 1 tablet (40 mg total) by mouth daily. 90 tablet 1 10/12/2021 at 0600   rosuvastatin (CRESTOR) 10 MG tablet Take 1 tablet (10 mg total) by mouth daily. 90 tablet 1 10/12/2021 at 0600   sertraline (ZOLOFT) 50 MG tablet Take 1 tablet (50 mg total) by mouth daily. 90 tablet 1 10/12/2021 at 0600   triamcinolone cream (KENALOG) 0.1 % Apply 1 application. topically 2 (two) times daily. 30 g 0 10/11/2021   triamterene-hydrochlorothiazide (DYAZIDE) 37.5-25 MG capsule Take 1 each (1 capsule total) by mouth daily. 90 capsule 1 10/12/2021 at 0600   fluticasone (FLONASE) 50 MCG/ACT nasal spray Place 2 sprays into both nostrils daily as needed for allergies or rhinitis.    at prn   Glucosamine-Chondroit-Vit C-Mn (GLUCOSAMINE CHONDR 1500  COMPLX PO) Take 1,500 mg by mouth daily. msm (Patient not taking: Reported on 10/12/2021)   Not Taking   oxyCODONE (ROXICODONE) 5 MG immediate release tablet Take 1-2 tablets (5-10 mg total) by mouth every 4 (four) hours as needed for moderate pain or severe pain. (Patient not taking: Reported on 07/19/2021) 40 tablet 0    traMADol (ULTRAM) 50 MG tablet Take 1 tablet (50 mg total) by mouth every 6 (six) hours as needed (Breakthrough pain). (Patient not taking: Reported on 07/19/2021) 40 tablet 0    No Known Allergies Past Medical History:  Diagnosis Date   Allergy    Arthritis    Biatrial enlargement    Carotid stenosis    Carpal tunnel syndrome, bilateral    Degenerative cervical spinal stenosis    Depression    GERD (gastroesophageal reflux disease)    Grade I diastolic dysfunction    a.) TTE on 07/17/2019 --> EF >55%; mild panvalvular regurgitation; G1DD.   Hyperlipidemia    Hypertension    Lumbar radiculitis    Mild intermittent asthma    OSA on CPAP    PVD (peripheral vascular disease) (HCC)    Stroke (HCC)     no residual effects   T2DM (type 2 diabetes mellitus) ([place])    Valvular regurgitation    a.) TTE on 07/17/2019 --> trival PR; mild AR, MR, TR  Varicose vein    Past Surgical History:  Procedure Laterality Date   CARPAL TUNNEL RELEASE Right 01/02/2019   Procedure: CARPAL TUNNEL RELEASE ENDOSCOPIC;  Surgeon: Corky Mull, MD;  Location: [facility];  Service: Orthopedics;  Laterality: Right;   CHOLECYSTECTOMY     CHONDROPLASTY  10/05/09   knee- shaving patellofemoral joint   COLONOSCOPY  2009?   ENDOVENOUS ABLATION SAPHENOUS VEIN W/ LASER     INJECTION KNEE Left 12/23/2019   Procedure: KNEE INJECTION;  Surgeon: Corky Mull, MD;  Location: [facility];  Service: Orthopedics;  Laterality: Left;   JOINT REPLACEMENT Left    hip   KNEE ARTHROSCOPY Left 07/25/2017   Procedure: Arthroscopic partial medial meniscectomy; arthroscopic debridement; and arthroscopic abrasion  chondroplasty of medial femoral condyle, lateral tibial plateau, and femoral trochlea; left knee.;  Surgeon: Corky Mull, MD;  Location: [place];  Service: Orthopedics;  Laterality: Left;   KNEE ARTHROSCOPY WITH MEDIAL MENISECTOMY Right    Partial medial and lateral menisectomy   SHOULDER ARTHROSCOPY Left 08/10/2014   Procedure: ARTHROSCOPY SHOULDER WITH LIMITED DEBRIDEMENT, REMOVAL OF LOOSE BODY AND RELEASE OF LONG END BICEPS TENDON;  Surgeon: Leanor Kail, MD;  Location: [place];  Service: Orthopedics;  Laterality: Left;   TOTAL HIP ARTHROPLASTY Left 01/25/2016   Procedure: TOTAL HIP ARTHROPLASTY;  Surgeon: Corky Mull, MD;  Location: [facility];  Service: Orthopedics;  Laterality: Left;   TOTAL KNEE ARTHROPLASTY Right 12/23/2019   Procedure: TOTAL KNEE ARTHROPLASTY;  Surgeon: Corky Mull, MD;  Location: [facility];  Service: Orthopedics;  Laterality: Right;   TOTAL KNEE ARTHROPLASTY Left 11/11/2020   Procedure: TOTAL KNEE ARTHROPLASTY;  Surgeon: Corky Mull, MD;  Location: [facility];  Service: Orthopedics;  Laterality: Left;   Social History   Socioeconomic History   Marital status: Married    Spouse name: Not on file   Number of children: Not on file   Years of education: Not on file   Highest education level: Not on file  Occupational History   Not on file  Tobacco Use   Smoking status: Never   Smokeless tobacco: Never  Vaping Use   Vaping Use: Never used  Substance and Sexual Activity   Alcohol use: Yes    Comment: rare   Drug use: No   Sexual activity: Yes  Other Topics Concern   Not on file  Social History Narrative   Not on file   Social Determinants of Health   Financial Resource Strain: Not on file  Food Insecurity: Not on file  Transportation Needs: Not on file  Physical Activity: Not on file  Stress: Not on file  Social Connections: Not on file  Intimate Partner Violence: Not on file   Social History   Social History Narrative    Not on file     ROS: Negative.     PE: HEENT: Negative. Lungs: Clear. Cardio: RR.   Assessment/Plan:  Proceed with planned endoscopy.   [facility] 10/12/2021

## 2021-10-12 NOTE — Anesthesia Preprocedure Evaluation (Signed)
Anesthesia Evaluation  Patient identified by MRN, date of birth, ID band Patient awake    Reviewed: Allergy & Precautions, NPO status , Patient's Chart, lab work & pertinent test results  History of Anesthesia Complications Negative for: history of anesthetic complications  Airway Mallampati: III  TM Distance: >3 FB Neck ROM: Full    Dental  (+) Poor Dentition, Dental Advidsory Given   Pulmonary neg shortness of breath, asthma , sleep apnea and Continuous Positive Airway Pressure Ventilation , neg recent URI,    breath sounds clear to auscultation- rhonchi (-) wheezing      Cardiovascular hypertension, Pt. on medications (-) angina+ Peripheral Vascular Disease  (-) CAD, (-) Past MI, (-) Cardiac Stents and (-) CABG  Rhythm:Regular Rate:Normal - Systolic murmurs and - Diastolic murmurs    Neuro/Psych neg Seizures PSYCHIATRIC DISORDERS Depression  Neuromuscular disease CVA, No Residual Symptoms    GI/Hepatic Neg liver ROS, GERD  ,  Endo/Other  diabetes, Oral Hypoglycemic Agents  Renal/GU negative Renal ROS     Musculoskeletal  (+) Arthritis ,   Abdominal (+) + obese,   Peds  Hematology negative hematology ROS (+)   Anesthesia Other Findings Past Medical History: No date: Allergy No date: Arthritis No date: Asthma     Comment:  mild No date: Carotid stenosis No date: Carpal tunnel syndrome     Comment:  bilateral No date: Depression No date: Diabetes mellitus without complication (HCC)     Comment:  Type II No date: GERD (gastroesophageal reflux disease) No date: Hyperlipidemia No date: Hypertension     Comment:  neg stress test 10 yrs ago - Dr Humphrey Rolls No date: PVD (peripheral vascular disease) ([place]) No date: Sleep apnea     Comment:  has BiPAP -doesn't use No date: Stroke [facility])     Comment:   no residual effects No date: Varicose vein     Comment:  treated by Dr. Delana Meyer   Reproductive/Obstetrics                              Lab Results  Component Value Date   WBC 7.8 11/01/2020   HGB 14.9 11/01/2020   HCT 43.9 11/01/2020   MCV 86.4 11/01/2020   PLT 198 11/01/2020    Anesthesia Physical  Anesthesia Plan  ASA: 3  Anesthesia Plan: General   Post-op Pain Management: Minimal or no pain anticipated   Induction: Intravenous  PONV Risk Score and Plan: 2 and Propofol infusion, TIVA and Ondansetron  Airway Management Planned: Nasal Cannula  Additional Equipment: None  Intra-op Plan:   Post-operative Plan:   Informed Consent: I have reviewed the patients History and Physical, chart, labs and discussed the procedure including the risks, benefits and alternatives for the proposed anesthesia with the patient or authorized representative who has indicated his/her understanding and acceptance.     Dental advisory given  Plan Discussed with: CRNA and Surgeon  Anesthesia Plan Comments: (Discussed risks of anesthesia with patient, including possibility of difficulty with spontaneous ventilation under anesthesia necessitating airway intervention, PONV, and rare risks such as cardiac or respiratory or neurological events, and allergic reactions. Discussed the role of CRNA in patient's perioperative care. Patient understands.)        Anesthesia Quick Evaluation

## 2021-10-12 NOTE — Transfer of Care (Signed)
Immediate Anesthesia Transfer of Care Note  Patient: Raymond [name]  Procedure(s) Performed: COLONOSCOPY WITH PROPOFOL  Patient Location: Endoscopy Unit  Anesthesia Type:General  Level of Consciousness: drowsy  Airway & Oxygen Therapy: Patient Spontanous Breathing and Patient connected to nasal cannula oxygen  Post-op Assessment: Report given to RN and Post -op Vital signs reviewed and stable  Post vital signs: Reviewed and stable  Last Vitals:  Vitals Value Taken Time  BP 81/54 10/12/21 0959  Temp    Pulse 61 10/12/21 0959  Resp 13 10/12/21 1001  SpO2 99 % 10/12/21 0959  Vitals shown include unvalidated device data.  Last Pain:  Vitals:   10/12/21 0841  TempSrc: Temporal  PainSc: 0-No pain         Complications: No notable events documented.

## 2021-10-12 NOTE — Op Note (Signed)
[facility] Gastroenterology Patient Name: Raymond [name] Procedure Date: 10/12/2021 9:17 AM MRN: [id number] Account #: [id number] Date of Birth: 05-20-1955 Admit Type: Outpatient Age: 66 Room: [facility] ENDO ROOM 1 Gender: Male Note Status: Finalized Instrument Name: Peds Colonoscope [id number] Procedure:             Colonoscopy Indications:           Screening for colorectal malignant neoplasm Providers:             Robert Bellow, MD Referring MD:          Juline Patch, MD (Referring MD) Medicines:             Propofol per Anesthesia Complications:         No immediate complications. Procedure:             Pre-Anesthesia Assessment:                        - Prior to the procedure, a History and Physical was                         performed, and patient medications, allergies and                         sensitivities were reviewed. The patient's tolerance                         of previous anesthesia was reviewed.                        - The risks and benefits of the procedure and the                         sedation options and risks were discussed with the                         patient. All questions were answered and informed                         consent was obtained.                        After obtaining informed consent, the colonoscope was                         passed under direct vision. Throughout the procedure,                         the patient's blood pressure, pulse, and oxygen                         saturations were monitored continuously. The                         Colonoscope was introduced through the anus and                         advanced to the the terminal ileum. The colonoscopy  was somewhat difficult due to a tortuous colon.                         Successful completion of the procedure was aided by                         using manual pressure. The patient tolerated the                         procedure  well. The quality of the bowel preparation                         was excellent. Findings:      A 3 mm polyp was found in the cecum. The polyp was sessile. Biopsies       were taken with a cold forceps for histology.      A few medium-mouthed diverticula were found in the sigmoid colon.      The retroflexed view of the distal rectum and anal verge was normal and       showed no anal or rectal abnormalities. Impression:            - One 3 mm polyp in the cecum. Biopsied.                        - Diverticulosis in the sigmoid colon.                        - The distal rectum and anal verge are normal on                         retroflexion view. Recommendation:        - Telephone endoscopist for pathology results in 1                         week. Procedure Code(s):     --- Professional ---                        [phone], Colonoscopy, flexible; with biopsy, single or                         multiple Diagnosis Code(s):     --- Professional ---                        Z12.11, Encounter for screening for malignant neoplasm                         of colon                        K63.5, Polyp of colon                        K57.30, Diverticulosis of large intestine without                         perforation or abscess without bleeding CPT copyright 2019 American Medical Association. All rights reserved. The codes documented in this report are preliminary and upon coder review may  be revised to meet  current compliance requirements. Robert Bellow, MD 10/12/2021 9:57:33 AM This report has been signed electronically. Number of Addenda: 0 Note Initiated On: 10/12/2021 9:17 AM Scope Withdrawal Time: 0 hours 11 minutes 18 seconds  Total Procedure Duration: 0 hours 24 minutes 8 seconds  Estimated Blood Loss:  Estimated blood loss was minimal.      [facility]

## 2021-10-13 ENCOUNTER — Other Ambulatory Visit: Payer: Self-pay | Admitting: Surgery

## 2021-10-13 ENCOUNTER — Encounter: Payer: Self-pay | Admitting: General Surgery

## 2021-10-13 DIAGNOSIS — M19011 Primary osteoarthritis, right shoulder: Secondary | ICD-10-CM

## 2021-10-13 DIAGNOSIS — M7581 Other shoulder lesions, right shoulder: Secondary | ICD-10-CM

## 2021-10-13 LAB — SURGICAL PATHOLOGY

## 2021-10-20 ENCOUNTER — Other Ambulatory Visit: Payer: Self-pay | Admitting: Family Medicine

## 2021-10-20 DIAGNOSIS — I10 Essential (primary) hypertension: Secondary | ICD-10-CM

## 2021-10-21 DIAGNOSIS — E119 Type 2 diabetes mellitus without complications: Secondary | ICD-10-CM | POA: Diagnosis not present

## 2021-10-27 ENCOUNTER — Other Ambulatory Visit: Payer: Self-pay | Admitting: Surgery

## 2021-10-28 ENCOUNTER — Other Ambulatory Visit: Payer: Self-pay | Admitting: Physical Medicine & Rehabilitation

## 2021-10-28 DIAGNOSIS — M5412 Radiculopathy, cervical region: Secondary | ICD-10-CM

## 2021-11-02 ENCOUNTER — Ambulatory Visit
Admission: RE | Admit: 2021-11-02 | Discharge: 2021-11-02 | Disposition: A | Discharge status: Home / Self Care | Payer: BC Managed Care – PPO | Source: Ambulatory Visit | Attending: Surgery | Admitting: Surgery

## 2021-11-02 DIAGNOSIS — M7581 Other shoulder lesions, right shoulder: Secondary | ICD-10-CM | POA: Diagnosis not present

## 2021-11-02 DIAGNOSIS — M19011 Primary osteoarthritis, right shoulder: Secondary | ICD-10-CM | POA: Insufficient documentation

## 2021-11-02 DIAGNOSIS — M25511 Pain in right shoulder: Secondary | ICD-10-CM | POA: Diagnosis not present

## 2021-11-08 ENCOUNTER — Encounter
Admission: RE | Admit: 2021-11-08 | Discharge: 2021-11-08 | Disposition: A | Discharge status: Home / Self Care | Payer: BC Managed Care – PPO | Source: Ambulatory Visit | Attending: Surgery | Admitting: Surgery

## 2021-11-08 DIAGNOSIS — Z01818 Encounter for other preprocedural examination: Secondary | ICD-10-CM | POA: Insufficient documentation

## 2021-11-08 DIAGNOSIS — Z01812 Encounter for preprocedural laboratory examination: Secondary | ICD-10-CM

## 2021-11-08 HISTORY — DX: Unspecified osteoarthritis, unspecified site: M19.90

## 2021-11-08 LAB — CBC WITH DIFFERENTIAL/PLATELET
Abs Immature Granulocytes: 0.04 10*3/uL (ref 0.00–0.07)
Basophils Absolute: 0.1 10*3/uL (ref 0.0–0.1)
Basophils Relative: 1 %
Eosinophils Absolute: 0.4 10*3/uL (ref 0.0–0.5)
Eosinophils Relative: 5 %
HCT: 42.4 % (ref 39.0–52.0)
Hemoglobin: 14.2 g/dL (ref 13.0–17.0)
Immature Granulocytes: 1 %
Lymphocytes Relative: 13 %
Lymphs Abs: 1 10*3/uL (ref 0.7–4.0)
MCH: 28.7 pg (ref 26.0–34.0)
MCHC: 33.5 g/dL (ref 30.0–36.0)
MCV: 85.7 fL (ref 80.0–100.0)
Monocytes Absolute: 0.7 10*3/uL (ref 0.1–1.0)
Monocytes Relative: 9 %
Neutro Abs: 6 10*3/uL (ref 1.7–7.7)
Neutrophils Relative %: 71 %
Platelets: 188 10*3/uL (ref 150–400)
RBC: 4.95 MIL/uL (ref 4.22–5.81)
RDW: 13 % (ref 11.5–15.5)
WBC: 8.3 10*3/uL (ref 4.0–10.5)
nRBC: 0 % (ref 0.0–0.2)

## 2021-11-08 LAB — COMPREHENSIVE METABOLIC PANEL
ALT: 22 U/L (ref 0–44)
AST: 18 U/L (ref 15–41)
Albumin: 4.2 g/dL (ref 3.5–5.0)
Alkaline Phosphatase: 61 U/L (ref 38–126)
Anion gap: 7 (ref 5–15)
BUN: 23 mg/dL (ref 8–23)
CO2: 28 mmol/L (ref 22–32)
Calcium: 9.3 mg/dL (ref 8.9–10.3)
Chloride: 103 mmol/L (ref 98–111)
Creatinine, Ser: 0.87 mg/dL (ref 0.61–1.24)
GFR, Estimated: >60 mL/min (ref >60)
Glucose, Bld: 139 mg/dL — ABNORMAL HIGH (ref 70–99)
Potassium: 3.6 mmol/L (ref 3.5–5.1)
Sodium: 138 mmol/L (ref 135–145)
Total Bilirubin: 0.9 mg/dL (ref 0.3–1.2)
Total Protein: 7.4 g/dL (ref 6.5–8.1)

## 2021-11-08 LAB — URINALYSIS, ROUTINE W REFLEX MICROSCOPIC
Bilirubin Urine: NEGATIVE
Glucose, UA: NEGATIVE mg/dL
Hgb urine dipstick: NEGATIVE
Ketones, ur: NEGATIVE mg/dL
Leukocytes,Ua: NEGATIVE
Nitrite: NEGATIVE
Protein, ur: NEGATIVE mg/dL
Specific Gravity, Urine: 1.016 (ref 1.005–1.030)
pH: 6 (ref 5.0–8.0)

## 2021-11-08 LAB — SURGICAL PCR SCREEN
MRSA, PCR: NEGATIVE
Staphylococcus aureus: NEGATIVE

## 2021-11-08 LAB — TYPE AND SCREEN
ABO/RH(D): AB POS
Antibody Screen: NEGATIVE

## 2021-11-08 NOTE — Patient Instructions (Addendum)
Your procedure is scheduled on: Tuesday, September 26 Report to the Registration Desk on the 1st floor of the [facility]. To find out your arrival time, please call [phone] between 1PM - 3PM on: Monday, September 25 If your arrival time is 6:00 am, do not arrive prior to that time as the [place] entrance doors do not open until 6:00 am.  REMEMBER: Instructions that are not followed completely may result in serious medical risk, up to and including death; or upon the discretion of your surgeon and anesthesiologist your surgery may need to be rescheduled.  Do not eat food after midnight the night before surgery.  No gum chewing, lozengers or hard candies.  You may however, drink water up to 2 hours before you are scheduled to arrive for your surgery. Do not drink anything within 2 hours of your scheduled arrival time.  In addition, your doctor has ordered for you to drink the provided  Gatorade G2 Drinking this carbohydrate drink up to two hours before surgery helps to reduce insulin resistance and improve patient outcomes. Please complete drinking 2 hours prior to scheduled arrival time.  TAKE THESE MEDICATIONS THE MORNING OF SURGERY WITH A SIP OF WATER:  Amlodipine Bupropion Gabapentin Pantoprazole - (take one the night before and one on the morning of surgery - helps to prevent nausea after surgery.) Rosuvastatin Sertraline Tramadol if needed for pain  Metformin - hold 2 days before surgery. Last day to take metformin is Saturday, September 23. Resume AFTER surgery.  One week prior to surgery: starting September 19 Stop aspirin and Anti-inflammatories (NSAIDS) such as Advil, Aleve, Ibuprofen, Motrin, Naproxen, Naprosyn and Aspirin based products such as Excedrin, Goodys Powder, BC Powder. Stop ANY OVER THE COUNTER supplements until after surgery.  You may however, continue to take Tylenol if needed for pain up until the day of surgery.  No Alcohol for 24 hours before  or after surgery.  No Smoking including e-cigarettes for 24 hours prior to surgery.  No chewable tobacco products for at least 6 hours prior to surgery.  No nicotine patches on the day of surgery.  Do not use any "recreational" drugs for at least a week prior to your surgery.  Please be advised that the combination of cocaine and anesthesia may have negative outcomes, up to and including death. If you test positive for cocaine, your surgery will be cancelled.  On the morning of surgery brush your teeth with toothpaste and water, you may rinse your mouth with mouthwash if you wish. Do not swallow any toothpaste or mouthwash.  Use CHG Soap as directed on instruction sheet.  Do not wear jewelry.  Do not wear lotions, powders, or perfumes.   Do not shave body from the neck down 48 hours prior to surgery just in case you cut yourself which could leave a site for infection.  Also, freshly shaved skin may become irritated if using the CHG soap.  Contact lenses, hearing aids and dentures may not be worn into surgery.  Do not bring valuables to the hospital. [facility] is not responsible for any missing/lost belongings or valuables.   Total Shoulder Arthroplasty:  use Benzolyl Peroxide 5% Gel as directed on instruction sheet.  Notify your doctor if there is any change in your medical condition (cold, fever, infection).  Wear comfortable clothing (specific to your surgery type) to the hospital.  After surgery, you can help prevent lung complications by doing breathing exercises.  Take deep breaths and cough every  1-2 hours. Your doctor may order a device called an Incentive Spirometer to help you take deep breaths.  If you are being admitted to the hospital overnight, leave your suitcase in the car. After surgery it may be brought to your room.  If you are being discharged the day of surgery, you will not be allowed to drive home. You will need a responsible adult (18 years or older) to  drive you home and stay with you that night.   If you are taking public transportation, you will need to have a responsible adult (18 years or older) with you. Please confirm with your physician that it is acceptable to use public transportation.   Please call the [place] Dept. at [phone] if you have any questions about these instructions.  Surgery Visitation Policy:  Patients undergoing a surgery or procedure may have two family members or support persons with them as long as the person is not COVID-19 positive or experiencing its symptoms.   Inpatient Visitation:    Visiting hours are 7 a.m. to 8 p.m. Up to four visitors are allowed at one time in a patient room, including children. The visitors may rotate out with other people during the day. One designated support person (adult) may remain overnight.      Preparing for Surgery with [place] (CHG) Soap  Before surgery, you can play an important role by reducing the number of germs on your skin.  CHG (Chlorhexidine gluconate) soap is an antiseptic cleanser which kills germs and bonds with the skin to continue killing germs even after washing.  Please do not use if you have an allergy to CHG or antibacterial soaps. If your skin becomes reddened/irritated stop using the CHG.  1. Shower the NIGHT BEFORE SURGERY and the MORNING OF SURGERY with CHG soap.  2. If you choose to wash your hair, wash your hair first as usual with your normal shampoo.  3. After shampooing, rinse your hair and body thoroughly to remove the shampoo.  4. Use CHG as you would any other liquid soap. You can apply CHG directly to the skin and wash gently with a scrungie or a clean washcloth.  5. Apply the CHG soap to your body only from the neck down. Do not use on open wounds or open sores. Avoid contact with your eyes, ears, mouth, and genitals (private parts). Wash face and genitals (private parts) with your normal  soap.  6. Wash thoroughly, paying special attention to the area where your surgery will be performed.  7. Thoroughly rinse your body with warm water.  8. Do not shower/wash with your normal soap after using and rinsing off the CHG soap.  9. Pat yourself dry with a clean towel.  10. Wear clean pajamas to bed the night before surgery.  12. Place clean sheets on your bed the night of your first shower and do not sleep with pets.  13. Shower again with the CHG soap on the day of surgery prior to arriving at the hospital.  14. Do not apply any deodorants/lotions/powders.  15. Please wear clean clothes to the hospital.    Preparing for Total Shoulder Arthroplasty  Before surgery, you can play an important role by reducing the number of germs on your skin by using the following products:  Benzoyl Peroxide Gel  o Reduces the number of germs present on the skin  o Applied twice a day to shoulder area starting two days before surgery  BENZOYL PEROXIDE 5% GEL  Please do not use if you have an allergy to benzoyl peroxide. If your skin becomes reddened/irritated stop using the benzoyl peroxide.  Starting two days before surgery, apply as follows:  1. Apply benzoyl peroxide in the morning and at night. Apply after taking a shower. If you are not taking a shower clean entire shoulder front, back, and side along with the armpit with a clean wet washcloth.  2. Place a quarter-sized dollop on your shoulder and rub in thoroughly, making sure to cover the front, back, and side of your shoulder, along with the armpit.  2 days before ____ AM ____ PM 1 day before ____ AM ____ PM    Sunday and Monday   3. Do this twice a day for two days. (Last application is the night before surgery, AFTER using the CHG soap).  4. Do NOT apply benzoyl peroxide gel on the day of surgery.

## 2021-11-09 DIAGNOSIS — M19011 Primary osteoarthritis, right shoulder: Secondary | ICD-10-CM | POA: Diagnosis not present

## 2021-11-09 DIAGNOSIS — E669 Obesity, unspecified: Secondary | ICD-10-CM | POA: Diagnosis not present

## 2021-11-09 DIAGNOSIS — E1165 Type 2 diabetes mellitus with hyperglycemia: Secondary | ICD-10-CM | POA: Diagnosis not present

## 2021-11-09 DIAGNOSIS — M7581 Other shoulder lesions, right shoulder: Secondary | ICD-10-CM | POA: Diagnosis not present

## 2021-11-10 ENCOUNTER — Ambulatory Visit
Admission: RE | Admit: 2021-11-10 | Discharge: 2021-11-10 | Disposition: A | Discharge status: Home / Self Care | Payer: BC Managed Care – PPO | Source: Ambulatory Visit | Attending: Physical Medicine & Rehabilitation | Admitting: Physical Medicine & Rehabilitation

## 2021-11-10 DIAGNOSIS — M5412 Radiculopathy, cervical region: Secondary | ICD-10-CM

## 2021-11-10 MED ORDER — TRIAMCINOLONE ACETONIDE 40 MG/ML IJ SUSP (RADIOLOGY)
60.0000 mg | Freq: Once | INTRAMUSCULAR | Status: AC
  Administered 2021-11-10: 60 mg via EPIDURAL

## 2021-11-10 MED ORDER — IOPAMIDOL (ISOVUE-M 300) INJECTION 61%
1.0000 mL | Freq: Once | INTRAMUSCULAR | Status: AC | PRN
  Administered 2021-11-10: 1 mL via EPIDURAL

## 2021-11-10 NOTE — Discharge Instructions (Signed)

## 2021-11-11 ENCOUNTER — Encounter: Payer: Self-pay | Admitting: Surgery

## 2021-11-11 NOTE — Progress Notes (Signed)
Perioperative Services  Pre-Admission/Anesthesia Testing Clinical Review  Date: 11/14/21  Patient Demographics:  Name: Raymond [name] DOB:   04/11/1955 MRN:   [id number]  Planned Surgical Procedure(s):    Case: [id number] Date/Time: 11/15/21 0715   Procedure: ANATOMIC VERSES  REVERSE SHOULDER ARTHROPLASTY WITH BICEPS TENODESIS (Right: Shoulder)   Anesthesia type: Choice   Pre-op diagnosis: Primary osteoarthritis of right shoulder   Location: [facility] OR ROOM 02 / [facility]   Surgeons: Corky Mull, MD   NOTE: Available PAT nursing documentation and vital signs have been reviewed. Clinical nursing staff has updated patient's PMH/PSHx, current medication list, and drug allergies/intolerances to ensure comprehensive history available to assist in medical decision making as it pertains to the aforementioned surgical procedure and anticipated anesthetic course. Extensive review of available clinical information performed. [place] [facility] and PSHx updated with any diagnoses/procedures that  may have been inadvertently omitted during his intake with the pre-admission testing department's nursing staff.  Clinical Discussion:  Raymond [name] is a 66 y.o. male who is submitted for pre-surgical anesthesia review and clearance prior to him undergoing the above procedure. Patient has never been a smoker. Pertinent PMH includes: angina, carotid stenosis, valvular heart disease, diastolic dysfunction, biatrial enlargement, CVA, PVD, HTN, HLD, T2DM, OSA (on nocturnal PAP therapy), asthma, GERD (on daily PPI therapy), OA, cervical spinal stenosis, depression  Patient is followed by cardiology [name], MD). He was last seen in the cardiology clinic on 01/20/2021; notes reviewed.  At the time of this clinic visit, patient doing well overall from a cardiovascular perspective.  He denied any episodes of chest pain, shortness breath, PND, orthopnea, palpitations, significant peripheral edema,  vertiginous symptoms, or presyncope/syncope.  Patient with a past medical history significant for cardiovascular diagnoses.  Myocardial perfusion imaging on 07/17/2019 revealed no evidence of stress-induced myocardial ischemia; LVEF 55%.    TTE on 07/17/2019 revealed normal LV systolic function; LVEF >14%.  There was mild biatrial enlargement.  Diastolic parameters consistent with G1 DD.  There was mild panvalvular regurgitation noted.   Blood pressure elevated at 150/90 on currently prescribed CCB (amlodipine), ARB (losartan), and diuretic (triamterene/HCTZ) therapies.  Patient is on rosuvastatin for his HLD diagnosis and ASCVD prevention. T2DM reasonably controlled on currently prescribed regimen; last HgbA1c was 7.1% when checked on 03/11/2021.  Patient does have an OSAH diagnosis, however at the time of his clinic visit, patient had not been using his prescribed nocturnal PAP therapy as much as had in the past.  Patient was encouraged to utilize PAP therapy as prescribed.  Functional capacity somewhat limited by orthopedic pain, however patient still felt to be able to achieve at least 4 METS of activity without angina/anginal equivalent symptoms.  No changes were made to his medication regimen.  Patient to follow-up with outpatient cardiology in 1 year or sooner if needed.  Raymond [name] is scheduled for an elective RIGHT ANATOMIC VERSES  REVERSE SHOULDER ARTHROPLASTY WITH BICEPS TENODESIS on 11/15/2021 with Dr. Milagros Evener, MD.  Given patient's past medical history significant for cardiovascular diagnoses, presurgical cardiac clearance was sought by the PAT team.  "Per cardiology, "this patient is optimized for surgery and may proceed with the planned procedural course with a LOW risk of significant perioperative cardiovascular complications".  In review of his medication reconciliation, it is noted the patient is on daily antiplatelet therapy.  He has been instructed on recommendations from his  cardiologist for holding his daily low-dose ASA for 3 days prior to his  procedure with plans to restart since postoperative bleeding respectively minimized by his primary attending surgeon.  The patient is aware that his last dose of ASA should be on 11/11/2021.  Patient denies previous perioperative complications with anesthesia in the past. In review of the available records, it is noted that patient underwent a general anesthetic course here at [facility] ([facility]) in 09/2021 without documented complications.      11/10/2021   10:19 AM 11/08/2021    8:58 AM 10/12/2021    9:59 AM  Vitals with BMI  Height  '6\' 1"'$    Weight  260 lbs   BMI  09.60   Systolic 454 098 81  Diastolic 89 78 54  Pulse 62 63 60    Providers/Specialists:   NOTE: Primary physician provider listed below. Patient may have been seen by APP or partner within same practice.   PROVIDER ROLE / SPECIALTY LAST OV  Poggi, Marshall Cork, MD Orthopedics (Surgeon) 11/09/2021  Juline Patch, MD Primary Care Provider 07/19/2021  Serafina Royals, MD Cardiology 01/20/2021  Mee Hives, MD Endocrinology 08/19/2021  Jennings Books, MD Neurology 04/26/2021  Girtha Hake, MD Physiatry 04/15/2021   Allergies:  Patient has no known allergies.  Current Home Medications:   No current facility-administered medications for this encounter.    ACCU-CHEK AVIVA PLUS test strip   amLODipine (NORVASC) 10 MG tablet   amoxicillin (AMOXIL) 500 MG tablet   aspirin EC 81 MG tablet   buPROPion (WELLBUTRIN) 75 MG tablet   cyclobenzaprine (FLEXERIL) 10 MG tablet   fluticasone (FLONASE) 50 MCG/ACT nasal spray   gabapentin (NEURONTIN) 300 MG capsule   glipiZIDE (GLUCOTROL) 5 MG tablet   losartan (COZAAR) 100 MG tablet   meclizine (ANTIVERT) 25 MG tablet   metFORMIN (GLUCOPHAGE) 500 MG tablet   oxyCODONE (ROXICODONE) 5 MG immediate release tablet   pantoprazole (PROTONIX) 40 MG tablet   rosuvastatin  (CRESTOR) 10 MG tablet   sertraline (ZOLOFT) 50 MG tablet   traMADol (ULTRAM) 50 MG tablet   triamcinolone cream (KENALOG) 0.1 %   triamterene-hydrochlorothiazide (DYAZIDE) 37.5-25 MG capsule   History:   Past Medical History:  Diagnosis Date   Allergy    Arthritis    Biatrial enlargement    Carotid stenosis    a.) Carotid doppler 03/26/2019: 11-91% RICA, 4-78% LICA   Carpal tunnel syndrome, bilateral    Degenerative cervical spinal stenosis    Depression    Diastolic dysfunction    a.) TTE on 07/17/2019 --> EF >55%; mild panvalvular regurgitation; G1DD.   GERD (gastroesophageal reflux disease)    Hyperlipidemia    Hypertension    Lumbar radiculitis    Mild intermittent asthma    OSA on CPAP    Osteoarthritis    PVD (peripheral vascular disease) (HCC)    Stroke (HCC)     no residual effects   T2DM (type 2 diabetes mellitus) ([place])    Valvular regurgitation    a.) TTE on 07/17/2019 --> trival PR; mild AR, MR, TR   Varicose vein    Past Surgical History:  Procedure Laterality Date   CARPAL TUNNEL RELEASE Right 01/02/2019   Procedure: CARPAL TUNNEL RELEASE ENDOSCOPIC;  Surgeon: Corky Mull, MD;  Location: [facility] ORS;  Service: Orthopedics;  Laterality: Right;   CHOLECYSTECTOMY  1988   CHONDROPLASTY Right 10/05/2009   knee- shaving patellofemoral joint   COLONOSCOPY  2010   COLONOSCOPY WITH PROPOFOL N/A 10/12/2021   Procedure: COLONOSCOPY WITH PROPOFOL;  Surgeon:  Robert Bellow, MD;  Location: [facility] ENDOSCOPY;  Service: Endoscopy;  Laterality: N/A;   ENDOVENOUS ABLATION SAPHENOUS VEIN W/ LASER     INJECTION KNEE Left 12/23/2019   Procedure: KNEE INJECTION;  Surgeon: Corky Mull, MD;  Location: [facility] ORS;  Service: Orthopedics;  Laterality: Left;   JOINT REPLACEMENT Left    hip   KNEE ARTHROSCOPY Left 07/25/2017   Procedure: Arthroscopic partial medial meniscectomy; arthroscopic debridement; and arthroscopic abrasion chondroplasty of medial femoral condyle, lateral  tibial plateau, and femoral trochlea; left knee.;  Surgeon: Corky Mull, MD;  Location: [place];  Service: Orthopedics;  Laterality: Left;   KNEE ARTHROSCOPY WITH MEDIAL MENISECTOMY Right    Partial medial and lateral menisectomy   SHOULDER ARTHROSCOPY Left 08/10/2014   Procedure: ARTHROSCOPY SHOULDER WITH LIMITED DEBRIDEMENT, REMOVAL OF LOOSE BODY AND RELEASE OF LONG END BICEPS TENDON;  Surgeon: Leanor Kail, MD;  Location: [place];  Service: Orthopedics;  Laterality: Left;   TOTAL HIP ARTHROPLASTY Left 01/25/2016   Procedure: TOTAL HIP ARTHROPLASTY;  Surgeon: Corky Mull, MD;  Location: [facility] ORS;  Service: Orthopedics;  Laterality: Left;   TOTAL KNEE ARTHROPLASTY Right 12/23/2019   Procedure: TOTAL KNEE ARTHROPLASTY;  Surgeon: Corky Mull, MD;  Location: [facility] ORS;  Service: Orthopedics;  Laterality: Right;   TOTAL KNEE ARTHROPLASTY Left 11/11/2020   Procedure: TOTAL KNEE ARTHROPLASTY;  Surgeon: Corky Mull, MD;  Location: [facility] ORS;  Service: Orthopedics;  Laterality: Left;   No family history on file. Social History   Tobacco Use   Smoking status: Never   Smokeless tobacco: Never  Vaping Use   Vaping Use: Never used  Substance Use Topics   Alcohol use: Yes    Comment: rare   Drug use: No    Pertinent Clinical Results:  LABS: Labs reviewed: Acceptable for surgery.  No visits with results within 3 Day(s) from this visit.  Latest known visit with results is:  Hospital Outpatient Visit on 11/08/2021  Component Date Value Ref Range Status   WBC 11/08/2021 8.3  4.0 - 10.5 K/uL Final   RBC 11/08/2021 4.95  4.22 - 5.81 MIL/uL Final   Hemoglobin 11/08/2021 14.2  13.0 - 17.0 g/dL Final   HCT 11/08/2021 42.4  39.0 - 52.0 % Final   MCV 11/08/2021 85.7  80.0 - 100.0 fL Final   MCH 11/08/2021 28.7  26.0 - 34.0 pg Final   MCHC 11/08/2021 33.5  30.0 - 36.0 g/dL Final   RDW 11/08/2021 13.0  11.5 - 15.5 % Final   Platelets 11/08/2021 188  150 - 400 K/uL  Final   nRBC 11/08/2021 0.0  0.0 - 0.2 % Final   Neutrophils Relative % 11/08/2021 71  % Final   Neutro Abs 11/08/2021 6.0  1.7 - 7.7 K/uL Final   Lymphocytes Relative 11/08/2021 13  % Final   Lymphs Abs 11/08/2021 1.0  0.7 - 4.0 K/uL Final   Monocytes Relative 11/08/2021 9  % Final   Monocytes Absolute 11/08/2021 0.7  0.1 - 1.0 K/uL Final   Eosinophils Relative 11/08/2021 5  % Final   Eosinophils Absolute 11/08/2021 0.4  0.0 - 0.5 K/uL Final   Basophils Relative 11/08/2021 1  % Final   Basophils Absolute 11/08/2021 0.1  0.0 - 0.1 K/uL Final   Immature Granulocytes 11/08/2021 1  % Final   Abs Immature Granulocytes 11/08/2021 0.04  0.00 - 0.07 K/uL Final   Performed at [facility], [address] [place], [place]  [address]   Sodium 11/08/2021 138  135 - 145 mmol/L Final   Potassium 11/08/2021 3.6  3.5 - 5.1 mmol/L Final   Chloride 11/08/2021 103  98 - 111 mmol/L Final   CO2 11/08/2021 28  22 - 32 mmol/L Final   Glucose, Bld 11/08/2021 139 (H)  70 - 99 mg/dL Final   Glucose reference range applies only to samples taken after fasting for at least 8 hours.   BUN 11/08/2021 23  8 - 23 mg/dL Final   Creatinine, Ser 11/08/2021 0.87  0.61 - 1.24 mg/dL Final   Calcium 11/08/2021 9.3  8.9 - 10.3 mg/dL Final   Total Protein 11/08/2021 7.4  6.5 - 8.1 g/dL Final   Albumin 11/08/2021 4.2  3.5 - 5.0 g/dL Final   AST 11/08/2021 18  15 - 41 U/L Final   ALT 11/08/2021 22  0 - 44 U/L Final   Alkaline Phosphatase 11/08/2021 61  38 - 126 U/L Final   Total Bilirubin 11/08/2021 0.9  0.3 - 1.2 mg/dL Final   GFR, Estimated 11/08/2021 >60  >60 mL/min Final   Comment: (NOTE) Calculated using the CKD-EPI Creatinine Equation (2021)    Anion gap 11/08/2021 7  5 - 15 Final   Performed at [facility], [place], [place] [address]   Color, Urine 11/08/2021 YELLOW (A)  YELLOW Final   APPearance 11/08/2021 CLEAR (A)  CLEAR Final   Specific Gravity, Urine 11/08/2021 1.016   1.005 - 1.030 Final   pH 11/08/2021 6.0  5.0 - 8.0 Final   Glucose, UA 11/08/2021 NEGATIVE  NEGATIVE mg/dL Final   Hgb urine dipstick 11/08/2021 NEGATIVE  NEGATIVE Final   Bilirubin Urine 11/08/2021 NEGATIVE  NEGATIVE Final   Ketones, ur 11/08/2021 NEGATIVE  NEGATIVE mg/dL Final   Protein, ur 11/08/2021 NEGATIVE  NEGATIVE mg/dL Final   Nitrite 11/08/2021 NEGATIVE  NEGATIVE Final   Leukocytes,Ua 11/08/2021 NEGATIVE  NEGATIVE Final   Performed at [facility], [address] [place], [place] [address]   ABO/RH(D) 11/08/2021 AB POS   Final   Antibody Screen 11/08/2021 NEG   Final   Sample Expiration 11/08/2021 11/22/2021,2359   Final   Extend sample reason 11/08/2021    Final                   Value:NO TRANSFUSIONS OR PREGNANCY IN THE PAST 3 MONTHS Performed at [facility], [address] [place], [place] [address]    MRSA, PCR 11/08/2021 NEGATIVE  NEGATIVE Final   Staphylococcus aureus 11/08/2021 NEGATIVE  NEGATIVE Final   Comment: (NOTE) The Xpert SA Assay (FDA approved for NASAL specimens in patients 39 years of age and older), is one component of a comprehensive surveillance program. It is not intended to diagnose infection nor to guide or monitor treatment. Performed at [facility], [address] [place], [place] [address]     ECG: Date: 11/08/2021 Time ECG obtained: 0907 AM Rate: 60 bpm Rhythm: Sinus rhythm with PACs Axis (leads I and aVF): Normal Intervals: PR 164 ms. QRS 96 ms. QTc 422 ms. ST segment and T wave changes: No evidence of acute ST segment elevation or depression Comparison: Similar to previous tracing obtained on 11/01/2020   IMAGING / PROCEDURES: MRI SHOULDER RIGHT WO CONTRAST performed on 11/02/2021 Moderate mid and anterior supraspinatus tendinosis with mild partial-thickness degenerative fraying/tearing of the articular side of the proximal tendon footprint in this region. Tiny anterior supraspinatus bursal sided  degenerative tear. Moderate to  high-grade proximal long head of the biceps tendinosis. Mild-to-moderate degenerative changes of the acromioclavicular joint. Type III acromion. Severe glenohumeral osteoarthritis. Degenerative posterosuperior chondrolabral junction tear with associated posterosuperior paralabral cysts extending into the superior aspect of the spinoglenoid notch.  DIAGNOSTIC RADIOGRAPHS OF RIGHT MINIMUM 2 VIEWS performed on 08/22/2021 Right shoulder with glenohumeral moderate severe degenerative joint changes with significant osteophyte formation and glenohumeral inferior erosive changes  with sclerotic changes noted.   There is no high riding humeral head.    MRI CERVICAL WITHOUT CONTRAST performed on 08/25/2020 Mild multilevel degenerative changes in the cervical spine with mild central stenosis at the C4-C5 and C5-C6 level Moderate right C3-C4 foraminal stenosis Mild bilateral C6-C7 foraminal stenosis Mild left foraminal stenosis C3-C4 and C5-C6 levels  MYOCARDIAL PERFUSION IMAGING STUDY (LEXISCAN) performed on 07/17/2019 LVEF 55% Regional wall motion reveals normal myocardial thickening and wall motion Overall quality of the study is good No artifacts noted Normal left ventricular cavity No evidence of stress-induced myocardial ischemia   ECHOCARDIOGRAM WITH BUBBLE STUDY done on 07/17/2019 LVEF >15% Normal LV systolic function Normal RV systolic function Mild tricuspid, mitral, and aortic valve insufficiency No valvular stenosis Sclerotic aortic valve Mild RV enlargement Mild biatrial enlargement Negative bubble study   BILATERAL CAROTID DUPLEX done on 03/26/2019 Right Carotid: Velocities in the right ICA are consistent with a 40-59% stenosis. Left Carotid: Velocities in the left ICA are consistent with a 1-39% stenosis.  Vertebrals:  Bilateral vertebral arteries demonstrate antegrade flow.  Subclavians: Normal flow hemodynamics were seen in bilateral  subclavian arteries.   Impression and Plan:  ARMOUR VILLANUEVA has been referred for pre-anesthesia review and clearance prior to him undergoing the planned anesthetic and procedural courses. Available labs, pertinent testing, and imaging results were personally reviewed by me. This patient has been appropriately cleared by cardiology with an overall LOW risk of significant perioperative cardiovascular complications.  Based on clinical review performed today (11/14/21), barring any significant acute changes in the patient's overall condition, it is anticipated that he will be able to proceed with the planned surgical intervention. Any acute changes in clinical condition may necessitate his procedure being postponed and/or cancelled. Patient will meet with anesthesia team (MD and/or CRNA) on the day of his procedure for preoperative evaluation/assessment. Questions regarding anesthetic course will be fielded at that time.   Pre-surgical instructions were reviewed with the patient during his PAT appointment and questions were fielded by PAT clinical staff. Patient was advised that if any questions or concerns arise prior to his procedure then he should return a call to PAT and/or his surgeon's office to discuss.  Honor Loh, MSN, APRN, FNP-C, CEN [facility]  Peri-operative Services Nurse Practitioner Phone: [phone] Fax: [phone] 11/14/21 7:05 AM  NOTE: This note has been prepared using Dragon dictation software. Despite my best ability to proofread, there is always the potential that unintentional transcriptional errors may still occur from this process.

## 2021-11-14 MED ORDER — CEFAZOLIN SODIUM-DEXTROSE 2-4 GM/100ML-% IV SOLN
2.0000 g | INTRAVENOUS | Status: AC
  Administered 2021-11-15: 3 g via INTRAVENOUS

## 2021-11-14 MED ORDER — ORAL CARE MOUTH RINSE: 15.0000 mL | Freq: Once | OROMUCOSAL | Status: AC

## 2021-11-14 MED ORDER — CHLORHEXIDINE GLUCONATE 0.12 % MT SOLN: 15.0000 mL | Freq: Once | OROMUCOSAL | Status: AC

## 2021-11-14 MED ORDER — SODIUM CHLORIDE 0.9 % IV SOLN: INTRAVENOUS | Status: DC

## 2021-11-15 ENCOUNTER — Other Ambulatory Visit: Payer: Self-pay

## 2021-11-15 ENCOUNTER — Ambulatory Visit: Payer: BC Managed Care – PPO

## 2021-11-15 ENCOUNTER — Encounter
Admission: RE | Disposition: A | Discharge status: Home / Self Care | Payer: Self-pay | Source: Ambulatory Visit | Attending: Surgery

## 2021-11-15 ENCOUNTER — Ambulatory Visit: Payer: BC Managed Care – PPO | Admitting: Urgent Care

## 2021-11-15 ENCOUNTER — Encounter: Payer: Self-pay | Admitting: Surgery

## 2021-11-15 ENCOUNTER — Ambulatory Visit
Admission: RE | Admit: 2021-11-15 | Discharge: 2021-11-15 | Disposition: A | Discharge status: Home / Self Care | Payer: BC Managed Care – PPO | Source: Ambulatory Visit | Attending: Surgery | Admitting: Surgery

## 2021-11-15 DIAGNOSIS — Z7984 Long term (current) use of oral hypoglycemic drugs: Secondary | ICD-10-CM | POA: Insufficient documentation

## 2021-11-15 DIAGNOSIS — E669 Obesity, unspecified: Secondary | ICD-10-CM | POA: Insufficient documentation

## 2021-11-15 DIAGNOSIS — G4733 Obstructive sleep apnea (adult) (pediatric): Secondary | ICD-10-CM | POA: Insufficient documentation

## 2021-11-15 DIAGNOSIS — Z6834 Body mass index (BMI) 34.0-34.9, adult: Secondary | ICD-10-CM | POA: Insufficient documentation

## 2021-11-15 DIAGNOSIS — E782 Mixed hyperlipidemia: Secondary | ICD-10-CM | POA: Diagnosis not present

## 2021-11-15 DIAGNOSIS — Z01812 Encounter for preprocedural laboratory examination: Secondary | ICD-10-CM

## 2021-11-15 DIAGNOSIS — G8918 Other acute postprocedural pain: Secondary | ICD-10-CM | POA: Diagnosis not present

## 2021-11-15 DIAGNOSIS — E785 Hyperlipidemia, unspecified: Secondary | ICD-10-CM | POA: Insufficient documentation

## 2021-11-15 DIAGNOSIS — E119 Type 2 diabetes mellitus without complications: Secondary | ICD-10-CM | POA: Diagnosis not present

## 2021-11-15 DIAGNOSIS — M7521 Bicipital tendinitis, right shoulder: Secondary | ICD-10-CM | POA: Diagnosis not present

## 2021-11-15 DIAGNOSIS — K219 Gastro-esophageal reflux disease without esophagitis: Secondary | ICD-10-CM | POA: Diagnosis not present

## 2021-11-15 DIAGNOSIS — J45909 Unspecified asthma, uncomplicated: Secondary | ICD-10-CM | POA: Insufficient documentation

## 2021-11-15 DIAGNOSIS — M19011 Primary osteoarthritis, right shoulder: Secondary | ICD-10-CM | POA: Diagnosis not present

## 2021-11-15 DIAGNOSIS — F32A Depression, unspecified: Secondary | ICD-10-CM | POA: Diagnosis not present

## 2021-11-15 DIAGNOSIS — I10 Essential (primary) hypertension: Secondary | ICD-10-CM | POA: Insufficient documentation

## 2021-11-15 DIAGNOSIS — E1151 Type 2 diabetes mellitus with diabetic peripheral angiopathy without gangrene: Secondary | ICD-10-CM | POA: Insufficient documentation

## 2021-11-15 DIAGNOSIS — M7581 Other shoulder lesions, right shoulder: Secondary | ICD-10-CM | POA: Diagnosis not present

## 2021-11-15 DIAGNOSIS — Z96611 Presence of right artificial shoulder joint: Secondary | ICD-10-CM | POA: Diagnosis not present

## 2021-11-15 DIAGNOSIS — Z471 Aftercare following joint replacement surgery: Secondary | ICD-10-CM | POA: Diagnosis not present

## 2021-11-15 DIAGNOSIS — G6189 Other inflammatory polyneuropathies: Secondary | ICD-10-CM

## 2021-11-15 HISTORY — PX: REVERSE SHOULDER ARTHROPLASTY: SHX5054

## 2021-11-15 HISTORY — DX: Other ill-defined heart diseases: I51.89

## 2021-11-15 LAB — GLUCOSE, CAPILLARY
Glucose-Capillary: 189 mg/dL — ABNORMAL HIGH (ref 70–99)
Glucose-Capillary: 256 mg/dL — ABNORMAL HIGH (ref 70–99)
Glucose-Capillary: 223 mg/dL — ABNORMAL HIGH (ref 70–99)
Glucose-Capillary: 306 mg/dL — ABNORMAL HIGH (ref 70–99)
Glucose-Capillary: 254 mg/dL — ABNORMAL HIGH (ref 70–99)
Glucose-Capillary: 291 mg/dL — ABNORMAL HIGH (ref 70–99)
Glucose-Capillary: 255 mg/dL — ABNORMAL HIGH (ref 70–99)
Glucose-Capillary: 233 mg/dL — ABNORMAL HIGH (ref 70–99)

## 2021-11-15 SURGERY — ARTHROPLASTY, SHOULDER, TOTAL, REVERSE
Anesthesia: General | Site: Shoulder | Laterality: Right

## 2021-11-15 MED ORDER — SODIUM CHLORIDE 0.9 % IR SOLN
Status: DC | PRN
  Administered 2021-11-15: 3000 mL

## 2021-11-15 MED ORDER — LIDOCAINE HCL (PF) 2 % IJ SOLN
INTRAMUSCULAR | Status: AC
  Filled 2021-11-15: qty 5

## 2021-11-15 MED ORDER — INSULIN ASPART 100 UNIT/ML IJ SOLN
INTRAMUSCULAR | Status: AC
  Administered 2021-11-15: 5 [IU] via INTRAVENOUS
  Filled 2021-11-15: qty 1

## 2021-11-15 MED ORDER — KETOROLAC TROMETHAMINE 15 MG/ML IJ SOLN
15.0000 mg | Freq: Once | INTRAMUSCULAR | Status: AC
  Administered 2021-11-15: 15 mg via INTRAVENOUS

## 2021-11-15 MED ORDER — OXYCODONE HCL 5 MG PO TABS: 5.0000 mg | ORAL_TABLET | ORAL | Status: DC | PRN

## 2021-11-15 MED ORDER — INSULIN ASPART 100 UNIT/ML IJ SOLN
5.0000 [IU] | Freq: Once | INTRAMUSCULAR | Status: AC
  Administered 2021-11-15: 5 [IU] via INTRAVENOUS

## 2021-11-15 MED ORDER — GLYCOPYRROLATE 0.2 MG/ML IJ SOLN
INTRAMUSCULAR | Status: AC
  Filled 2021-11-15: qty 2

## 2021-11-15 MED ORDER — PROPOFOL 10 MG/ML IV BOLUS
INTRAVENOUS | Status: DC | PRN
  Administered 2021-11-15: 150 mg via INTRAVENOUS

## 2021-11-15 MED ORDER — LIDOCAINE HCL (CARDIAC) PF 100 MG/5ML IV SOSY
PREFILLED_SYRINGE | INTRAVENOUS | Status: DC | PRN
  Administered 2021-11-15: 100 mg via INTRAVENOUS

## 2021-11-15 MED ORDER — BUPIVACAINE-EPINEPHRINE (PF) 0.5% -1:200000 IJ SOLN
INTRAMUSCULAR | Status: DC | PRN
  Administered 2021-11-15: 30 mL

## 2021-11-15 MED ORDER — METOCLOPRAMIDE HCL 5 MG/ML IJ SOLN: 5.0000 mg | Freq: Three times a day (TID) | INTRAMUSCULAR | Status: DC | PRN

## 2021-11-15 MED ORDER — CEFAZOLIN SODIUM-DEXTROSE 2-4 GM/100ML-% IV SOLN
INTRAVENOUS | Status: AC
  Filled 2021-11-15: qty 100

## 2021-11-15 MED ORDER — BUPIVACAINE HCL (PF) 0.5 % IJ SOLN
INTRAMUSCULAR | Status: AC
  Filled 2021-11-15: qty 30

## 2021-11-15 MED ORDER — NEOMYCIN-POLYMYXIN B GU 40-200000 IR SOLN
Status: AC
  Filled 2021-11-15: qty 20

## 2021-11-15 MED ORDER — ONDANSETRON HCL 4 MG/2ML IJ SOLN
INTRAMUSCULAR | Status: AC
  Filled 2021-11-15: qty 2

## 2021-11-15 MED ORDER — KETOROLAC TROMETHAMINE 15 MG/ML IJ SOLN
INTRAMUSCULAR | Status: AC
  Filled 2021-11-15: qty 1

## 2021-11-15 MED ORDER — BUPIVACAINE HCL (PF) 0.5 % IJ SOLN
INTRAMUSCULAR | Status: DC | PRN
  Administered 2021-11-15: 10 mL via PERINEURAL

## 2021-11-15 MED ORDER — SUGAMMADEX SODIUM 200 MG/2ML IV SOLN
INTRAVENOUS | Status: DC | PRN
  Administered 2021-11-15: 200 mg via INTRAVENOUS

## 2021-11-15 MED ORDER — EPHEDRINE SULFATE (PRESSORS) 50 MG/ML IJ SOLN
INTRAMUSCULAR | Status: DC | PRN
  Administered 2021-11-15 (×2): 5 mg via INTRAVENOUS
  Administered 2021-11-15: 10 mg via INTRAVENOUS

## 2021-11-15 MED ORDER — INSULIN ASPART 100 UNIT/ML IJ SOLN
INTRAMUSCULAR | Status: AC
  Filled 2021-11-15: qty 1

## 2021-11-15 MED ORDER — FENTANYL CITRATE (PF) 100 MCG/2ML IJ SOLN
INTRAMUSCULAR | Status: DC | PRN
  Administered 2021-11-15: 50 ug via INTRAVENOUS

## 2021-11-15 MED ORDER — FENTANYL CITRATE (PF) 100 MCG/2ML IJ SOLN: 25.0000 ug | INTRAMUSCULAR | Status: DC | PRN

## 2021-11-15 MED ORDER — ONDANSETRON HCL 4 MG PO TABS: 4.0000 mg | ORAL_TABLET | Freq: Four times a day (QID) | ORAL | Status: DC | PRN

## 2021-11-15 MED ORDER — ONDANSETRON HCL 4 MG/2ML IJ SOLN
INTRAMUSCULAR | Status: DC | PRN
  Administered 2021-11-15: 4 mg via INTRAVENOUS

## 2021-11-15 MED ORDER — LIDOCAINE HCL (PF) 1 % IJ SOLN
INTRAMUSCULAR | Status: DC | PRN
  Administered 2021-11-15: 4 mL via SUBCUTANEOUS

## 2021-11-15 MED ORDER — GLYCOPYRROLATE 0.2 MG/ML IJ SOLN
INTRAMUSCULAR | Status: DC | PRN
  Administered 2021-11-15 (×2): .2 mg via INTRAVENOUS

## 2021-11-15 MED ORDER — LIDOCAINE HCL (PF) 1 % IJ SOLN
INTRAMUSCULAR | Status: AC
  Filled 2021-11-15: qty 5

## 2021-11-15 MED ORDER — SUCCINYLCHOLINE CHLORIDE 200 MG/10ML IV SOSY
PREFILLED_SYRINGE | INTRAVENOUS | Status: DC | PRN
  Administered 2021-11-15: 140 mg via INTRAVENOUS

## 2021-11-15 MED ORDER — INSULIN ASPART 100 UNIT/ML IJ SOLN: 5.0000 [IU] | Freq: Once | INTRAMUSCULAR | Status: DC

## 2021-11-15 MED ORDER — BUPIVACAINE LIPOSOME 1.3 % IJ SUSP
INTRAMUSCULAR | Status: AC
  Filled 2021-11-15: qty 10

## 2021-11-15 MED ORDER — INSULIN ASPART 100 UNIT/ML IJ SOLN: 3.0000 [IU] | Freq: Once | INTRAMUSCULAR | Status: AC

## 2021-11-15 MED ORDER — DEXAMETHASONE SODIUM PHOSPHATE 10 MG/ML IJ SOLN
INTRAMUSCULAR | Status: DC | PRN
  Administered 2021-11-15: 10 mg via INTRAVENOUS

## 2021-11-15 MED ORDER — PROMETHAZINE HCL 25 MG/ML IJ SOLN: 6.2500 mg | INTRAMUSCULAR | Status: DC | PRN

## 2021-11-15 MED ORDER — GABAPENTIN 300 MG PO CAPS: 900.0000 mg | ORAL_CAPSULE | Freq: Three times a day (TID) | ORAL | Status: AC

## 2021-11-15 MED ORDER — CEFAZOLIN SODIUM-DEXTROSE 2-4 GM/100ML-% IV SOLN: 2.0000 g | Freq: Four times a day (QID) | INTRAVENOUS | Status: DC

## 2021-11-15 MED ORDER — MIDAZOLAM HCL 2 MG/2ML IJ SOLN
INTRAMUSCULAR | Status: AC
  Administered 2021-11-15: 1 mg via INTRAVENOUS
  Filled 2021-11-15: qty 2

## 2021-11-15 MED ORDER — BUPIVACAINE LIPOSOME 1.3 % IJ SUSP
INTRAMUSCULAR | Status: AC
  Filled 2021-11-15: qty 20

## 2021-11-15 MED ORDER — INSULIN ASPART 100 UNIT/ML IJ SOLN: 5.0000 [IU] | Freq: Once | INTRAMUSCULAR | Status: AC

## 2021-11-15 MED ORDER — ROCURONIUM BROMIDE 100 MG/10ML IV SOLN
INTRAVENOUS | Status: DC | PRN
  Administered 2021-11-15: 10 mg via INTRAVENOUS
  Administered 2021-11-15: 20 mg via INTRAVENOUS

## 2021-11-15 MED ORDER — INSULIN ASPART 100 UNIT/ML IJ SOLN
INTRAMUSCULAR | Status: AC
  Administered 2021-11-15: 3 [IU] via INTRAVENOUS
  Filled 2021-11-15: qty 1

## 2021-11-15 MED ORDER — CEFAZOLIN SODIUM-DEXTROSE 2-4 GM/100ML-% IV SOLN
INTRAVENOUS | Status: AC
  Administered 2021-11-15: 2 g via INTRAVENOUS
  Filled 2021-11-15: qty 100

## 2021-11-15 MED ORDER — MIDAZOLAM HCL 2 MG/2ML IJ SOLN: 1.0000 mg | INTRAMUSCULAR | Status: DC | PRN

## 2021-11-15 MED ORDER — OXYCODONE HCL 5 MG PO TABS: 5.0000 mg | ORAL_TABLET | ORAL | 0 refills | Status: DC | PRN

## 2021-11-15 MED ORDER — TRANEXAMIC ACID 1000 MG/10ML IV SOLN
INTRAVENOUS | Status: AC
  Filled 2021-11-15: qty 20

## 2021-11-15 MED ORDER — METOCLOPRAMIDE HCL 10 MG PO TABS: 5.0000 mg | ORAL_TABLET | Freq: Three times a day (TID) | ORAL | Status: DC | PRN

## 2021-11-15 MED ORDER — MIDAZOLAM HCL 2 MG/2ML IJ SOLN
INTRAMUSCULAR | Status: AC
  Filled 2021-11-15: qty 2

## 2021-11-15 MED ORDER — CHLORHEXIDINE GLUCONATE 0.12 % MT SOLN
OROMUCOSAL | Status: AC
  Administered 2021-11-15: 15 mL via OROMUCOSAL
  Filled 2021-11-15: qty 15

## 2021-11-15 MED ORDER — ROCURONIUM BROMIDE 10 MG/ML (PF) SYRINGE
PREFILLED_SYRINGE | INTRAVENOUS | Status: AC
  Filled 2021-11-15: qty 10

## 2021-11-15 MED ORDER — GLIPIZIDE 5 MG PO TABS: 10.0000 mg | ORAL_TABLET | Freq: Two times a day (BID) | ORAL | Status: DC

## 2021-11-15 MED ORDER — PHENYLEPHRINE HCL (PRESSORS) 10 MG/ML IV SOLN
INTRAVENOUS | Status: AC
  Filled 2021-11-15: qty 1

## 2021-11-15 MED ORDER — BUPIVACAINE HCL (PF) 0.5 % IJ SOLN
INTRAMUSCULAR | Status: AC
  Filled 2021-11-15: qty 10

## 2021-11-15 MED ORDER — PHENYLEPHRINE HCL-NACL 20-0.9 MG/250ML-% IV SOLN
INTRAVENOUS | Status: DC | PRN
  Administered 2021-11-15: 40 ug/min via INTRAVENOUS

## 2021-11-15 MED ORDER — SODIUM CHLORIDE 0.9 % IV SOLN: INTRAVENOUS | Status: DC

## 2021-11-15 MED ORDER — SUCCINYLCHOLINE CHLORIDE 200 MG/10ML IV SOSY
PREFILLED_SYRINGE | INTRAVENOUS | Status: AC
  Filled 2021-11-15: qty 10

## 2021-11-15 MED ORDER — 0.9 % SODIUM CHLORIDE (POUR BTL) OPTIME
TOPICAL | Status: DC | PRN
  Administered 2021-11-15: 500 mL

## 2021-11-15 MED ORDER — TRANEXAMIC ACID 1000 MG/10ML IV SOLN
INTRAVENOUS | Status: DC | PRN
  Administered 2021-11-15: 1000 mg via TOPICAL

## 2021-11-15 MED ORDER — PROPOFOL 10 MG/ML IV BOLUS
INTRAVENOUS | Status: AC
  Filled 2021-11-15: qty 20

## 2021-11-15 MED ORDER — PHENYLEPHRINE HCL (PRESSORS) 10 MG/ML IV SOLN
INTRAVENOUS | Status: DC | PRN
  Administered 2021-11-15 (×3): 80 ug via INTRAVENOUS

## 2021-11-15 MED ORDER — BUPIVACAINE LIPOSOME 1.3 % IJ SUSP
INTRAMUSCULAR | Status: DC | PRN
  Administered 2021-11-15: 20 mL via PERINEURAL

## 2021-11-15 MED ORDER — CEFAZOLIN SODIUM 1 G IJ SOLR
INTRAMUSCULAR | Status: AC
  Filled 2021-11-15: qty 10

## 2021-11-15 MED ORDER — ONDANSETRON HCL 4 MG/2ML IJ SOLN: 4.0000 mg | Freq: Four times a day (QID) | INTRAMUSCULAR | Status: DC | PRN

## 2021-11-15 MED ORDER — FENTANYL CITRATE PF 50 MCG/ML IJ SOSY: 50.0000 ug | PREFILLED_SYRINGE | INTRAMUSCULAR | Status: DC | PRN

## 2021-11-15 MED ORDER — FENTANYL CITRATE PF 50 MCG/ML IJ SOSY
PREFILLED_SYRINGE | INTRAMUSCULAR | Status: AC
  Administered 2021-11-15: 50 ug via INTRAVENOUS
  Filled 2021-11-15: qty 1

## 2021-11-15 MED ORDER — FENTANYL CITRATE (PF) 100 MCG/2ML IJ SOLN
INTRAMUSCULAR | Status: AC
  Filled 2021-11-15: qty 2

## 2021-11-15 MED ORDER — DEXAMETHASONE SODIUM PHOSPHATE 10 MG/ML IJ SOLN
INTRAMUSCULAR | Status: AC
  Filled 2021-11-15: qty 1

## 2021-11-15 SURGICAL SUPPLY — 66 items
APL PRP STRL LF DISP 70% ISPRP (MISCELLANEOUS) ×2
BLADE SAW SAG 25X90X1.19 (BLADE) ×1 IMPLANT
CEMENT BONE 40GM (Cement) IMPLANT
CHLORAPREP W/TINT 26 (MISCELLANEOUS) ×1 IMPLANT
COMPONENT NUMERAL SHLD 56X52 (Shoulder) IMPLANT
COOLER POLAR GLACIER W/PUMP (MISCELLANEOUS) ×1 IMPLANT
COVER BACK TABLE REUSABLE LG (DRAPES) ×1 IMPLANT
DRAPE 3/4 80X56 (DRAPES) ×1 IMPLANT
DRAPE INCISE IOBAN 66X45 STRL (DRAPES) ×1 IMPLANT
DRSG OPSITE POSTOP 4X8 (GAUZE/BANDAGES/DRESSINGS) ×1 IMPLANT
ELECT BLADE 6.5 EXT (BLADE) IMPLANT
ELECT CAUTERY BLADE 6.4 (BLADE) ×1 IMPLANT
ELECT REM PT RETURN 9FT ADLT (ELECTROSURGICAL) ×1
ELECTRODE REM PT RTRN 9FT ADLT (ELECTROSURGICAL) ×1 IMPLANT
GAUZE XEROFORM 1X8 LF (GAUZE/BANDAGES/DRESSINGS) ×1 IMPLANT
GLENOID HEAD 58-55 ART 19X20 (Shoulder) IMPLANT
GLOVE BIO SURGEON STRL SZ7.5 (GLOVE) ×4 IMPLANT
GLOVE BIO SURGEON STRL SZ8 (GLOVE) ×4 IMPLANT
GLOVE BIOGEL PI IND STRL 8 (GLOVE) ×2 IMPLANT
GLOVE SURG UNDER LTX SZ8 (GLOVE) ×1 IMPLANT
GOWN STRL REUS W/ TWL LRG LVL3 (GOWN DISPOSABLE) ×1 IMPLANT
GOWN STRL REUS W/ TWL XL LVL3 (GOWN DISPOSABLE) ×1 IMPLANT
GOWN STRL REUS W/TWL LRG LVL3 (GOWN DISPOSABLE) ×1
GOWN STRL REUS W/TWL XL LVL3 (GOWN DISPOSABLE) ×1
HOOD PEEL AWAY FLYTE STAYCOOL (MISCELLANEOUS) ×3 IMPLANT
HUMERAL COMP SHLD 56X52 (Shoulder) ×1 IMPLANT
IV NS IRRIG 3000ML ARTHROMATIC (IV SOLUTION) ×1 IMPLANT
KIT INST INFERIOR GLENOID SHLD (INSTRUMENTS) IMPLANT
KIT STABILIZATION SHOULDER (MISCELLANEOUS) ×1 IMPLANT
KIT TURNOVER KIT A (KITS) ×1 IMPLANT
MANIFOLD NEPTUNE II (INSTRUMENTS) ×1 IMPLANT
MASK FACE SPIDER DISP (MASK) ×1 IMPLANT
MAT ABSORB  FLUID 56X50 GRAY (MISCELLANEOUS) ×1
MAT ABSORB FLUID 56X50 GRAY (MISCELLANEOUS) ×1 IMPLANT
NDL MAYO CATGUT SZ1 (NEEDLE) IMPLANT
NDL SAFETY ECLIP 18X1.5 (MISCELLANEOUS) ×1 IMPLANT
NDL SPNL 20GX3.5 QUINCKE YW (NEEDLE) ×1 IMPLANT
NEEDLE MAYO CATGUT SZ1 (NEEDLE) ×1 IMPLANT
NEEDLE SPNL 20GX3.5 QUINCKE YW (NEEDLE) ×1 IMPLANT
NS IRRIG 500ML POUR BTL (IV SOLUTION) ×1 IMPLANT
PACK ARTHROSCOPY SHOULDER (MISCELLANEOUS) ×1 IMPLANT
PAD ARMBOARD 7.5X6 YLW CONV (MISCELLANEOUS) ×1 IMPLANT
PAD WRAPON POLAR SHDR UNIV (MISCELLANEOUS) ×1 IMPLANT
POST TAPER SHLD 12X32 (Post) IMPLANT
PULSAVAC PLUS IRRIG FAN TIP (DISPOSABLE) ×1
SLING ULTRA II LG (MISCELLANEOUS) IMPLANT
SLING ULTRA II M (MISCELLANEOUS) IMPLANT
SPONGE T-LAP 18X18 ~~LOC~~+RFID (SPONGE) ×2 IMPLANT
STAPLER SKIN PROX 35W (STAPLE) ×1 IMPLANT
SUT ETHIBOND 0 MO6 C/R (SUTURE) ×1 IMPLANT
SUT FIBERWIRE #2 38 BLUE 1/2 (SUTURE)
SUT VIC AB 0 CT1 36 (SUTURE) ×1 IMPLANT
SUT VIC AB 2-0 CT1 27 (SUTURE) ×2
SUT VIC AB 2-0 CT1 TAPERPNT 27 (SUTURE) ×2 IMPLANT
SUTURE FIBERWR #2 38 BLUE 1/2 (SUTURE) ×4 IMPLANT
SYR 10ML LL (SYRINGE) ×1 IMPLANT
SYR 30ML LL (SYRINGE) ×1 IMPLANT
SYR TOOMEY 50ML (SYRINGE) IMPLANT
SYR TOOMEY IRRIG 70ML (MISCELLANEOUS) ×1
SYRINGE TOOMEY IRRIG 70ML (MISCELLANEOUS) IMPLANT
TAPE TRANSPORE STRL 2 31045 (GAUZE/BANDAGES/DRESSINGS) IMPLANT
TIP FAN IRRIG PULSAVAC PLUS (DISPOSABLE) ×1 IMPLANT
TOWER CARTRIDGE SMART MIX (DISPOSABLE) IMPLANT
TRAP FLUID SMOKE EVACUATOR (MISCELLANEOUS) ×1 IMPLANT
WATER STERILE IRR 500ML POUR (IV SOLUTION) ×1 IMPLANT
WRAPON POLAR PAD SHDR UNIV (MISCELLANEOUS) ×1

## 2021-11-15 NOTE — Anesthesia Postprocedure Evaluation (Signed)
Anesthesia Post Note  Patient: Raymond [name]  Procedure(s) Performed: ANATOMIC  SHOULDER ARTHROPLASTY WITH BICEPS TENODESIS (Right: Shoulder)  Anesthesia Type: General Anesthetic complications: no Comments: Patient with multiple elevated BG in postop received total of 18U IV insulin and BG remains elevated. Patient does not check BG regularly at home. Patient refusing more IV insulin and would like to go home. Patient instructed to monitor BG frequently at home especially prior to take home medications, watch for signs/symptoms of hypoglycemia.    No notable events documented.   Last Vitals:  Vitals:   11/15/21 1458 11/15/21 1605  BP: (!) 142/77 (!) 148/76  Pulse: 64 (!) 56  Resp: 16 17  Temp:  (!) 36.4 C  SpO2: 97% 97%    Last Pain:  Vitals:   11/15/21 1458  TempSrc:   PainSc: 0-No pain                 Ilene Qua

## 2021-11-15 NOTE — H&P (Signed)
History of Present Illness:  Raymond [name] is a 66 y.o. male is here for history and physical for a right reverse shoulder arthroplasty to be done by Dr. Roland Rack. The patient presents for follow-up of his left shoulder pain secondary to degenerative joint disease. The patient was last seen for these symptoms 1 year ago. The patient had an MRI of the right shoulder on November 02, 2021. The patient notes mild persistent pain in his left shoulder, but feels that his right shoulder is actually causing him more difficulties. The patient rates his shoulder pain at 2/10 on today's visit, but notes that his symptoms are worse with activities. The right shoulder pain is primarily in the posterolateral aspect of the shoulder but can radiate down into the upper arm. His left shoulder pain radiates to the left side of his neck and down his arm to his hand. The patient does have a history of cervical degenerative disease for which she sees Dr. Alba Destine. Apparently, he received a left shoulder injection by Dr. Alba Destine several months ago which provided limited relief of his symptoms. His symptoms are aggravated by activities at or above shoulder level as well as when trying to reach behind his back. He also has pain at night. He notes crepitance in both shoulders with range of motion, as well as some numbness and paresthesias to his hands.  Current Outpatient Medications: amLODIPine (NORVASC) 10 MG tablet Take 1 tablet (10 mg total) by mouth once daily  amoxicillin (AMOXIL) 500 MG capsule TAKE 4 CAPSULES BY MOUTH 1 HOUR PRIOR TO DENTAL TREATMENT  aspirin 81 MG EC tablet Take 1 tablet (81 mg total) by mouth once daily  buPROPion (WELLBUTRIN) 75 MG tablet Take 1 tablet (75 mg total) by mouth 2 (two) times daily  cyclobenzaprine (FLEXERIL) 10 MG tablet TAKE 1 TABLET BY MOUTH TWICE A DAY AS NEEDED FOR MUSCLE SPASM 60 tablet 11  glipiZIDE (GLUCOTROL) 5 MG tablet Take two tablets (10 mg total) twice daily 90 tablet 5   losartan (COZAAR) 100 MG tablet TAKE 1 TABLET BY MOUTH EVERY DAY 90 tablet 3  meclizine (ANTIVERT) 25 mg tablet TAKE 1/2 TO 1 TABLET BY MOUTH EVERY 8 HOURS AS NEEDED FOR DIZZINESS  metFORMIN (GLUCOPHAGE) 500 MG tablet Take two 500 mg tablets (1000 mg total) by mouth twice daily, with meals. 120 tablet 5  pantoprazole (PROTONIX) 40 MG DR tablet Take 1 tablet (40 mg total) by mouth once daily  polyethylene glycol (MIRALAX) powder One bottle for colonoscopy prep. Use as directed. 255 g 0  promethazine (PHENERGAN) 25 MG tablet TAKE 1 TABLET BY MOUTH EVERY 8 HOURS AS NEEDED FOR NAUSEA  rosuvastatin (CRESTOR) 10 MG tablet Take 1 tablet (10 mg total) by mouth once daily  sertraline (ZOLOFT) 50 MG tablet Take 1 tablet (50 mg total) by mouth once daily  tiZANidine (ZANAFLEX) 2 MG tablet TAKE 1 TABLET (2 MG TOTAL) BY MOUTH NIGHTLY AS NEEDED 30 tablet 1  traMADoL (ULTRAM) 50 mg tablet Take 1 tablet (50 mg total) by mouth every 6 (six) hours as needed for Pain for up to 30 doses 30 tablet 0  triamterene-hydrochlorothiazide (DYAZIDE) 37.5-25 mg capsule Take 1 capsule by mouth once daily  gabapentin (NEURONTIN) 300 MG capsule Take 3 capsules (900 mg total) by mouth 3 (three) times daily 810 capsule 3   Allergies: No Known Allergies  Past Medical History:  Arthrosis of knee 08/14/2013  Benign essential HTN 07/26/2016  Biatrial enlargement  Bicipital tendinitis of left shoulder  07/21/2014  Carotid stenosis  Carpal tunnel syndrome, right 08/09/2018  Chest pain 07/24/2016  Chickenpox  Complex tear of medial meniscus of left knee as current injury 07/26/2017  Degenerative cervical spinal stenosis  Depression 07/21/2015  Diabetes mellitus type 2, uncomplicated (CMS-HCC)  GERD (gastroesophageal reflux disease) 16/11/9602  Grade I diastolic dysfunction 54/10/8117  a.) TTE on 07/17/2019 --> EF >55%; mild panvalvular regurgitation; G1DD.  Hyperlipidemia, mixed 07/26/2016  Hypertension 07/21/2015   Impingement syndrome of left shoulder 07/21/2014  Lumbar radiculitis 08/14/2013  Mild intermittent asthma  Neuritis or radiculitis due to rupture of lumbar intervertebral disc 08/14/2013  OSA on CPAP  Primary osteoarthritis of left hip 11/03/2015  Primary osteoarthritis of left shoulder 08/09/2018  Primary osteoarthritis of right knee 01/24/2017  PVD (peripheral vascular disease) (CMS-HCC)  Rotator cuff tendinitis, left 08/09/2018  Sleep apnea  Stable angina pectoris (CMS-HCC) 07/26/2016  Stroke (CMS-HCC)  Uncontrolled type 2 diabetes mellitus without complication, without long-term current use of insulin 09/12/2016  Valvular regurgitation 07/17/2019  a.) TTE on 07/17/2019 --> trival PR; mild AR, MR, TR  Varicose veins of leg with pain, left 12/30/2018   Past Surgical History:  CHOLECYSTECTOMY 1988 (Dr Bary Castilla)  COLONOSCOPY 04/16/2008 (Dr. Allen Norris)  DIAGNOSTIC AND OPERATIVE ARTHROSCOPY WITH PARTIAL MEDIAL AND LATERAL MENISECTOMY, DR.CALIFF---  KNEE CHONDROPLASTIC SHAVING PATELLOFEMORAL JOINT 10/05/2009  Arthroscopic debridement of chondral lesions left shoulder along with arthroscopic removal of loose body and arthroscopic release of the long head of the biceps tendon Left 08/10/2014 (Dr. Jefm Bryant)  Byers Left 01/25/2016  Arthroscopic partial medial menscectomy, arthroscopic debridement, and arthroscopic abrasion chondroplasty of medial femoral condyle, lateral tibial plateau, and femoral trochlea,left knee Left 07/25/2017 (Dr. Roland Rack)  Endoscopic right carpal tunnel release. Right 01/02/2019 (Dr. Roland Rack)  Right TKA using [employer] V[employer] system 12/23/2019 (Dr. Roland Rack)  Left TKA using [employer] V[employer] system 11/11/2020 (Dr. Roland Rack)  ENDOVENOUS ABLATION SAPHENOUS VEIN W/ LASER   Family History:  Myocardial Infarction (Heart attack) Mother  High blood pressure (Hypertension) Mother  Clotting disorder Mother  Coronary Artery Disease (Blocked arteries around heart) Mother   Dementia Mother  Stroke Mother  High blood pressure (Hypertension) Father  Dementia Father  Myocardial Infarction (Heart attack) Brother  Alcohol abuse Brother  No Known Problems Maternal Grandmother  No Known Problems Maternal Grandfather  No Known Problems Paternal Grandmother  Diabetes Paternal Grandfather  No Known Problems Daughter  Asthma Son  Colon cancer Neg Hx  Breast cancer Neg Hx   Social History:   Socioeconomic History:  Marital status: Married  Number of children: 2  Occupational History  Occupation: Garment/textile technologist  Tobacco Use  Smoking status: Never  Smokeless tobacco: Never  Vaping Use  Vaping Use: Never used  Substance and Sexual Activity  Alcohol use: Not Currently  Comment: once in a while  Drug use: Never  Sexual activity: Yes  Partners: Female  Birth control/protection: Surgical, None   Review of Systems:  A comprehensive 14 point ROS was performed, reviewed, and the pertinent orthopaedic findings are documented in the HPI.  Physical Exam: Vitals:  11/09/21 1409  BP: (!) 145/83  Pulse: 82  Weight: (!) 119.1 kg (262 lb 9.6 oz)  Height: 185.4 cm ('6\' 1"'$ )  PainSc: 7  PainLoc: Shoulder   General/Constitutional: The patient appears to be well-nourished, well-developed, and in no acute distress. Neuro/Psych: Normal mood and affect, oriented to person, place and time. Eyes: Non-icteric. Pupils are equal, round, and reactive to light, and exhibit synchronous movement. ENT: Unremarkable. Lymphatic: No palpable adenopathy. Respiratory:  Lungs clear to auscultation, Normal chest excursion, No wheezes, and Non-labored breathing Cardiovascular: Regular rate and rhythm. No murmurs. and No edema, swelling or tenderness, except as noted in detailed exam. Integumentary: No impressive skin lesions present, except as noted in detailed exam. Musculoskeletal: Unremarkable, except as noted in detailed exam.  Right shoulder exam: SKIN: Normal SWELLING: None WARMTH:  None LYMPH NODES: No adenopathy palpable CREPITUS: Mild glenohumeral crepitance TENDERNESS: Mild tenderness over anterior shoulder ROM (active):  Forward flexion: 120 degrees Abduction: 115 degrees Internal rotation: L4 ROM (passive):  Forward flexion: 135 degrees Abduction: 130 degrees  ER/IR at 90 abd: 70 degrees/45 degrees  He describes mild pain and crepitance with all motions.  STRENGTH: Forward flexion:4-4+/5 Abduction: 4-4+/5 External rotation: 4+/5 Internal rotation: 5/5 Pain with RC testing: Mild pain with resisted forward flexion and abduction  STABILITY: Normal  SPECIAL TESTS: Luan Pulling' test: Mildly positive Speed's test: Negative Capsulitis - pain w/ passive ER: No Crossed arm test: Minimally positive Crank: Not evaluated Anterior apprehension: Negative  The patient is neurovascularly intact to the right upper extremity and hand.  Imaging:  Recent AP, true AP, and Y-scapular views of the right shoulder are available for review and have been reviewed by myself. These films demonstrate advanced degenerative changes of the glenohumeral joint with complete loss of the glenohumeral joint space and a small to moderate-sized beard osteophyte off the inferior aspect of the humeral head. The subacromial space appears to be reasonably well-maintained. No lytic lesions or fractures are identified.  MRI Results: MRI of the right shoulder was completed on November 02, 2021. The MRI revealed moderate mid to anterior supraspinatus tendinosis with mild partial-thickness degenerative fraying and tearing of the articular surface at the footprint. There is moderate to high-grade proximal long head biceps tendinosis. There is mild degenerative AC joint changes. There is severe glenohumeral osteoarthritis.   Assessment: 1. Primary osteoarthritis of right shoulder.  2. Rotator cuff tendinitis, right.  3. Uncontrolled type 2 diabetes mellitus with hyperglycemia.  4. Obesity (BMI  30.0-34.9), unspecified.   Plan: The treatment options were discussed with the patient. In addition, patient educational materials were provided regarding the diagnosis and treatment options. Dr. Roland Rack recommended a surgical procedure, specifically an anatomic right shoulder arthroplasty. The procedure was discussed with the patient, as were the potential risks (including bleeding, infection, nerve and/or blood vessel injury, persistent or recurrent pain, loosening and/or failure of the components, dislocation, need for further surgery, blood clots, strokes, heart attacks and/or arhythmias, pneumonia, etc.) and benefits. The patient states his understanding and wishes to proceed. All of the patient's questions and concerns were answered. He can call any time with further concerns. He will follow up post-surgery, routine.    H&P reviewed and patient re-examined. No changes.

## 2021-11-15 NOTE — Op Note (Signed)
11/15/2021  10:47 AM  Patient:   Raymond [name]  Pre-Op Diagnosis:   Severe degenerative joint disease with biceps tendinopathy, right shoulder.  Post-Op Diagnosis:   Same.  Procedure:   Anatomic right total shoulder arthroplasty with biceps tenodesis.  Surgeon:   Pascal Lux, MD  Assistant:   Cameron Proud, PA-C  Anesthesia:   General endotracheal with an interscalene block using Exparel placed preoperatively by the anesthesiologist.  Findings:   As above.  The rotator cuff was in satisfactory condition.  Complications:   None  EBL:   100 cc  Fluids:   1200 cc crystalloid  UOP:   None  TT:   None  Drains:   None  Closure:   Staples  Implants:   Ovo-Motion 52 x 56 mm press fit humeral head and 19 x 20 mm cemented glenoid hemi-cap.  Brief Clinical Note:   The patient is a 66 year old male with a long history of gradually worsening right shoulder pain. The patient's symptoms have progressed despite medications, activity modification, etc. The patient's history and examination are consistent with advanced degenerative joint disease confirmed by plain radiographs. A preoperative MRI scan showed that the rotator cuff remained in satisfactory condition. The patient presents at this time for an anatomic right total shoulder arthroplasty.  Procedure:   The patient underwent placement of an interscalene block using Exparel by the anesthesiologist in the preoperative holding area before being brought into the operating room and lain in the supine position. The patient then underwent general endotracheal intubation and anesthesia before the patient was repositioned in the beach chair position using the beach chair positioner. The left shoulder and upper extremity were prepped with ChloraPrep solution before being draped sterilely. Preoperative antibiotics were administered.   A timeout was performed to verify the appropriate surgical site before a standard anterior approach to the  shoulder was made through an approximately 4-5 inch incision. The incision was carried down through the subcutaneous tissues to expose the deltopectoral fascia. The interval between the deltoid and pectoralis muscles was identified and this plane developed, retracting the cephalic vein laterally with the deltoid muscle. The conjoined tendon was identified. Its lateral margin was dissected and the Kolbel self-retraining retractor inserted. The "three sisters" were identified and cauterized. Bursal tissues were removed to improve visualization.   The biceps tendon was identified near the inferior aspect of the bicipital groove. A soft tissue tenodesis was performed by attaching the biceps tendon to the adjacent pectoralis major tendon using two #0 Ethibond interrupted sutures. The biceps tendon was then transected just proximal to the tenodesis site. The subscapularis tendon was released from its attachment to the lesser tuberosity 1 cm proximal to its insertion and several tagging sutures placed. The inferior capsule was released with care after identifying and protecting the axillary nerve.   The proximal humerus was carefully measured and found to be optimally replicated by the 52 x 56 mm humeral head. The appropriate guide was positioned and the central guidewire inserted. The centering shaft was advanced over the guidepin to the appropriate depth. The circumferential reamer was then advanced over the guidewire to ream down the humeral head before the access reamer was utilized and advanced fully. The centering shaft was removed along with the remaining bone plug before a protective metal plate was applied over the prepared surface.  Attention was directed to the glenoid. The labrum was debrided circumferentially before the drill guide was positioned. The guidewire was drilled into the glenoid to the  appropriate depth. After verifying its position, it was overreamed with the inferior glenoid reamer, making  several stops to verify the appropriate depth and checking the depth with the appropriate trial component. The trial was positioned and the flexible peg drill inserted and advanced fully. Several additional holes were placed using the appropriate punch to optimize cement fixation.  The glenoid was copiously irrigated with sterile saline solution using the pulse lavage before the surface was packed with a neosynephrine soaked piece of vaginal sponge. Meanwhile, the cement was mixed on the back table. When the cement was ready, a small amount of cement was inserted into the glenoid defect and pressurized digitally with the finger condom.  This process was repeated two more times before the permanent 19 x 20 mm component was cemented into place and held firmly using the pressurizer tool. The excess cement was removed using a Surveyor, quantity. Once the cemented hardened, the surface was carefully inspected and several small pieces of hardened cement removed.  Attention was redirected to the humeral side. The appropriate sized preparation trial was positioned and secured using several short guidepins. The pilot drill was inserted to the appropriate depth. The 12 mm taper post was then inserted and advanced to the appropriate depth. Finally, the permanent 52 x 56 mm humeral articular component was positioned appropriately and impacted into place to seat the component onto the taper post. The adequacy of fixation was verified manually and with an osteotome, and found to be excellent.  The shoulder was reduced and placed through a range of motion.  It was stable with abduction and external rotation, and did not appear to be too tight.  The wound was copiously irrigated with sterile saline solution using the jet lavage system before 30 cc of 0.5% Sensorcaine with epinephrine was injected into the pericapsular and peri-incisional tissues to help with postoperative analgesia. The subscapularis tendon was reapproximated  using #2 FiberWire interrupted sutures. The deltopectoral interval was closed using #0 Vicryl interrupted sutures before the subcutaneous tissues were closed using 2-0 Vicryl interrupted sutures. The skin was closed using staples. Prior to closing the skin, 1 g of transexemic acid in 10 cc of normal saline was injected intra-articularly to help with postoperative bleeding. A sterile occlusive dressing was applied to the wound before the arm was placed into a shoulder immobilizer with an abduction pillow. A Polar Care system also was applied to the shoulder. The patient was then transferred back to a hospital bed before being awakened, extubated, and returned to the recovery room in satisfactory condition after tolerating the procedure well.

## 2021-11-15 NOTE — Transfer of Care (Signed)
Immediate Anesthesia Transfer of Care Note  Patient: Raymond [name]  Procedure(s) Performed: ANATOMIC  SHOULDER ARTHROPLASTY WITH BICEPS TENODESIS (Right: Shoulder)  Patient Location: PACU  Anesthesia Type:General  Level of Consciousness: awake and alert   Airway & Oxygen Therapy: Patient Spontanous Breathing and Patient connected to nasal cannula oxygen  Post-op Assessment: Report given to RN and Post -op Vital signs reviewed and stable  Post vital signs: Reviewed and stable    Last Vitals:  Vitals Value Taken Time  BP 108/51 11/15/21 1049  Temp    Pulse 72 11/15/21 1051  Resp 12 11/15/21 1051  SpO2 98 % 11/15/21 1051  Vitals shown include unvalidated device data.  Last Pain:  Vitals:   11/15/21 0635  TempSrc: Temporal  PainSc: 4          Complications: No notable events documented.

## 2021-11-15 NOTE — Anesthesia Preprocedure Evaluation (Signed)
Anesthesia Evaluation  Patient identified by MRN, date of birth, ID band Patient awake    Reviewed: Allergy & Precautions, NPO status , Patient's Chart, lab work & pertinent test results  History of Anesthesia Complications Negative for: history of anesthetic complications  Airway Mallampati: III  TM Distance: >3 FB Neck ROM: Full    Dental  (+) Poor Dentition, Dental Advidsory Given   Pulmonary neg shortness of breath, asthma , sleep apnea and Continuous Positive Airway Pressure Ventilation , neg recent URI,    breath sounds clear to auscultation- rhonchi (-) wheezing      Cardiovascular hypertension, Pt. on medications (-) angina(-) CAD, (-) Past MI, (-) Cardiac Stents and (-) CABG  Rhythm:Regular Rate:Normal - Systolic murmurs and - Diastolic murmurs    Neuro/Psych neg Seizures PSYCHIATRIC DISORDERS Depression  Neuromuscular disease CVA, No Residual Symptoms    GI/Hepatic Neg liver ROS, GERD  ,  Endo/Other  diabetes, Oral Hypoglycemic Agents  Renal/GU negative Renal ROS     Musculoskeletal  (+) Arthritis ,   Abdominal (+) + obese,   Peds  Hematology negative hematology ROS (+)   Anesthesia Other Findings Past Medical History: No date: Allergy No date: Arthritis No date: Asthma     Comment:  mild No date: Carotid stenosis No date: Carpal tunnel syndrome     Comment:  bilateral No date: Depression No date: Diabetes mellitus without complication (HCC)     Comment:  Type II No date: GERD (gastroesophageal reflux disease) No date: Hyperlipidemia No date: Hypertension     Comment:  neg stress test 10 yrs ago - Dr Humphrey Rolls No date: PVD (peripheral vascular disease) ([place]) No date: Sleep apnea     Comment:  has BiPAP -doesn't use No date: Stroke [facility])     Comment:   no residual effects No date: Varicose vein     Comment:  treated by Dr. Delana Meyer   Reproductive/Obstetrics                              Lab Results  Component Value Date   WBC 8.3 11/08/2021   HGB 14.2 11/08/2021   HCT 42.4 11/08/2021   MCV 85.7 11/08/2021   PLT 188 11/08/2021    Anesthesia Physical  Anesthesia Plan  ASA: 3  Anesthesia Plan: General   Post-op Pain Management:    Induction: Intravenous  PONV Risk Score and Plan: 1 and Ondansetron, Dexamethasone, Treatment may vary due to age or medical condition and Midazolam  Airway Management Planned: Oral ETT  Additional Equipment:   Intra-op Plan:   Post-operative Plan: Extubation in OR  Informed Consent: I have reviewed the patients History and Physical, chart, labs and discussed the procedure including the risks, benefits and alternatives for the proposed anesthesia with the patient or authorized representative who has indicated his/her understanding and acceptance.     Dental advisory given  Plan Discussed with: CRNA and Anesthesiologist  Anesthesia Plan Comments:         Anesthesia Quick Evaluation

## 2021-11-15 NOTE — Progress Notes (Signed)
Patient CBG 233 after 5 units IV Novolog, patient received a total of 18 units IV novolog. Patient refusing to be given any more Novolog. Dr. Danne Baxter notified, per MD patient ok to be discharged home, pt is to monitor blood glucose at home and check it before taking glipizide and merformin tonight. Patient family verbalized understanding.

## 2021-11-15 NOTE — Anesthesia Postprocedure Evaluation (Signed)
Anesthesia Post Note  Patient: Raymond [name]  Procedure(s) Performed: ANATOMIC  SHOULDER ARTHROPLASTY WITH BICEPS TENODESIS (Right: Shoulder)  Patient location during evaluation: PACU Anesthesia Type: General Level of consciousness: awake and alert, oriented and patient cooperative Pain management: pain level controlled Vital Signs Assessment: post-procedure vital signs reviewed and stable Respiratory status: spontaneous breathing, nonlabored ventilation and respiratory function stable Cardiovascular status: blood pressure returned to baseline and stable Postop Assessment: adequate PO intake Anesthetic complications: no   No notable events documented.   Last Vitals:  Vitals:   11/15/21 1130 11/15/21 1146  BP: 112/61 (!) 115/59  Pulse: 79 73  Resp: 11 16  Temp: 36.7 C 36.7 C  SpO2: 96% 95%    Last Pain:  Vitals:   11/15/21 1146  TempSrc:   PainSc: 0-No pain                 Darrin Nipper

## 2021-11-15 NOTE — Progress Notes (Signed)
Verbal order given by Dr. Bertell Maria for novolog 5 units IV r/t CBG 256

## 2021-11-15 NOTE — Anesthesia Procedure Notes (Signed)
Procedure Name: Intubation Date/Time: 11/15/2021 7:47 AM  Performed by: Fredderick Phenix, CRNAPre-anesthesia Checklist: Patient identified, Emergency Drugs available, Suction available and Patient being monitored Patient Re-evaluated:Patient Re-evaluated prior to induction Oxygen Delivery Method: Circle system utilized Preoxygenation: Pre-oxygenation with 100% oxygen Induction Type: IV induction Ventilation: Mask ventilation without difficulty Laryngoscope Size: Mac and 4 Grade View: Grade II Tube type: Oral Tube size: 7.5 mm Number of attempts: 1 Airway Equipment and Method: Stylet Placement Confirmation: ETT inserted through vocal cords under direct vision, positive ETCO2 and breath sounds checked- equal and bilateral Secured at: 22 cm Tube secured with: Tape Dental Injury: Teeth and Oropharynx as per pre-operative assessment

## 2021-11-15 NOTE — Evaluation (Signed)
Occupational Therapy Evaluation Patient Details Name: Raymond [name] MRN: [id number] DOB: May 29, 1955 Today's Date: 11/15/2021   History of Present Illness Raymond [name] is a 67 y.o. male admitted for a right TSA   Clinical Impression   Patient presenting with decreased independence in self-care and safety. Patient in recliner, wife present and agree able to OT services. Patient reports he lives at home with his wife (24/7 assistance) , 2 level home. He is able to live on main level that has a walk-in shower, shower chair, regular toilet, QC, life chair, scooter, and RW. He plans to sleep in lift chair upon discharge. Patient currently functioning at min A for UB and LB dressing; supervision for sit <> stand, and was able to ambulate independently to bathroom and perform peri-care. Patient was educated on the polar care system, shoulder immobilizer, UB and LB dressing techniques. Patient was also educated on hand, wrist and shoulder exercises. Patient and caregiver were able to verbalize understanding and demonstrate. Continued OT not recommended at this time. OT to complete order.    Recommendations for follow up therapy are one component of a multi-disciplinary discharge planning process, led by the attending physician.  Recommendations may be updated based on patient status, additional functional criteria and insurance authorization.   Follow Up Recommendations  No OT follow up    Assistance Recommended at Discharge Intermittent Supervision/Assistance  Patient can return home with the following A little help with bathing/dressing/bathroom;Assist for transportation;Assistance with cooking/housework    Functional Status Assessment  Patient has had a recent decline in their functional status and demonstrates the ability to make significant improvements in function in a reasonable and predictable amount of time.  Equipment Recommendations  None recommended by OT    Recommendations for Other  Services       Precautions / Restrictions Precautions Precautions: Shoulder;Fall Shoulder Interventions: Don joy ultra sling;Shoulder abduction pillow;At all times;Off for dressing/bathing/exercises Restrictions Weight Bearing Restrictions: Yes RUE Weight Bearing: Non weight bearing      Mobility Bed Mobility               General bed mobility comments: seated in recliner chair    Transfers Overall transfer level: Needs assistance   Transfers: Sit to/from Stand Sit to Stand: Supervision                  Balance Overall balance assessment: Independent                                         ADL either performed or assessed with clinical judgement   ADL Overall ADL's : Needs assistance/impaired                 Upper Body Dressing : Minimal assistance   Lower Body Dressing: Minimal assistance                       Vision Baseline Vision/History: 1 Wears glasses              Pertinent Vitals/Pain Pain Assessment Pain Assessment: No/denies pain     Hand Dominance Right   Extremity/Trunk Assessment Upper Extremity Assessment Upper Extremity Assessment: RUE deficits/detail RUE Deficits / Details: R TSA   Lower Extremity Assessment Lower Extremity Assessment: Overall WFL for tasks assessed       Communication Communication Communication: No difficulties   Cognition Arousal/Alertness: Awake/alert Behavior  During Therapy: WFL for tasks assessed/performed Overall Cognitive Status: Within Functional Limits for tasks assessed                                                  Home Living Family/patient expects to be discharged to:: Private residence Living Arrangements: Spouse/significant other Available Help at Discharge: Family;Available 24 hours/day Type of Home: House Home Access: Stairs to enter [employer] of Steps: 4 Entrance Stairs-Rails: None Home Layout: Two level;Able  to live on main level with bedroom/bathroom;Bed/bath upstairs     Bathroom Shower/Tub: Occupational psychologist: Standard     Home Equipment: Cane - Holiday representative (2 wheels) (Teacher, English as a foreign language and scooter)          Prior Functioning/Environment Prior Level of Function : Independent/Modified Independent;Driving                        OT Problem List:        OT Treatment/Interventions:      OT Goals(Current goals can be found in the care plan section) Acute Rehab OT Goals Patient Stated Goal: to return home. OT Goal Formulation: With patient/family Time For Goal Achievement: 11/15/21 Potential to Achieve Goals: Good  OT Frequency:      Co-evaluation              AM-PAC OT "6 Clicks" Daily Activity     Outcome Measure Help from another person eating meals?: None Help from another person taking care of personal grooming?: A Little Help from another person toileting, which includes using toliet, bedpan, or urinal?: A Little Help from another person bathing (including washing, rinsing, drying)?: A Little Help from another person to put on and taking off regular upper body clothing?: A Lot Help from another person to put on and taking off regular lower body clothing?: A Little 6 Click Score: 18   End of Session Nurse Communication: Mobility status  Activity Tolerance: Patient tolerated treatment well Patient left: in chair;with family/visitor present                   Time: 1338-1410 OT Time Calculation (min): 32 min Charges:  OT General Charges $OT Visit: 1 Visit OT Evaluation $OT Eval Moderate Complexity: 1 Mod OT Treatments $Self Care/Home Management : 8-22 mins    Juwana Thoreson, OTS 11/15/2021, 2:34 PM

## 2021-11-15 NOTE — Addendum Note (Signed)
Addendum  created 11/15/21 1644 by Ilene Qua, MD   Clinical Note Signed

## 2021-11-15 NOTE — Progress Notes (Signed)
Dr. Bertell Maria: anesthesia made aware of blood glucose after 5units:  223, ok no further orders needed, patient made aware

## 2021-11-15 NOTE — Discharge Instructions (Addendum)
Orthopedic discharge instructions: May shower with intact OpSite dressing once nerve block has worn off (Saturday).  Apply ice frequently to shoulder or use Polar Care device. Take ibuprofen 600-800 mg TID with meals for 5-7 days, then as necessary. Take oxycodone as prescribed when needed.  May supplement with ES Tylenol if necessary. Keep shoulder immobilizer on at all times except may remove for bathing purposes and for exercises. Follow-up in 10-14 days or as scheduled.  AMBULATORY SURGERY  DISCHARGE INSTRUCTIONS   The drugs that you were given will stay in your system until tomorrow so for the next 24 hours you should not:  Drive an automobile Make any legal decisions Drink any alcoholic beverage   You may resume regular meals tomorrow.  Today it is better to start with liquids and gradually work up to solid foods.  You may eat anything you prefer, but it is better to start with liquids, then soup and crackers, and gradually work up to solid foods.   Please notify your doctor immediately if you have any unusual bleeding, trouble breathing, redness and pain at the surgery site, drainage, fever, or pain not relieved by medication.    Additional Instructions:   PLEASE LEAVE GREEN ARMBAND ON FOR 4 DAYS    Please contact your physician with any problems or Same Day Surgery at [phone], Monday through Friday 6 am to 4 pm, or  at [facility] number at [phone].   POLAR CARE INFORMATION  [email or web address]  How to use Venturia Cold Therapy System?  YouTube   [email or web address]  OPERATING INSTRUCTIONS  Start the product With dry hands, connect the transformer to the electrical connection located on the top of the cooler. Next, plug the transformer into an appropriate electrical outlet. The unit will automatically start running at this point.  To stop the pump, disconnect electrical power.  Unplug to stop the product  when not in use. Unplugging the Polar Care unit turns it off. Always unplug immediately after use. Never leave it plugged in while unattended. Remove pad.    FIRST ADD WATER TO FILL LINE, THEN ICE---Replace ice when existing ice is almost melted  1 Discuss Treatment with your [facility] Practitioner and Use Only as Prescribed 2 Apply Insulation Barrier & Cold Therapy Pad 3 Check for Moisture 4 Inspect Skin Regularly  Tips and Trouble Shooting Usage Tips 1. Use cubed or chunked ice for optimal performance. 2. It is recommended to drain the Pad between uses. To drain the pad, hold the Pad upright with the hose pointed toward the ground. Depress the black plunger and allow water to drain out. 3. You may disconnect the Pad from the unit without removing the pad from the affected area by depressing the silver tabs on the hose coupling and gently pulling the hoses apart. The Pad and unit will seal itself and will not leak. Note: Some dripping during release is normal. 4. DO NOT RUN PUMP WITHOUT WATER! The pump in this unit is designed to run with water. Running the unit without water will cause permanent damage to the pump. 5. Unplug unit before removing lid.  TROUBLESHOOTING GUIDE Pump not running, Water not flowing to the pad, Pad is not getting cold 1. Make sure the transformer is plugged into the wall outlet. 2. Confirm that the ice and water are filled to the indicated levels. 3. Make sure there are no kinks in the pad. 4. Gently pull on the blue tube to make sure  the tube/pad junction is straight. 5. Remove the pad from the treatment site and ll it while the pad is lying at; then reapply. 6. Confirm that the pad couplings are securely attached to the unit. Listen for the double clicks (Figure 1) to confirm the pad couplings are securely attached.  Leaks    Note: Some condensation on the lines, controller, and pads is unavoidable, especially in warmer climates. 1. If using a [place]  Polar Care Cold Therapy unit with a detachable Cold Therapy Pad, and a leak exists (other than condensation on the lines) disconnect the pad couplings. Make sure the silver tabs on the couplings are depressed before reconnecting the pad to the pump hose; then confirm both sides of the coupling are properly clicked in. 2. If the coupling continues to leak or a leak is detected in the pad itself, stop using it and call [facility] at [phone].  Cleaning After use, empty and dry the unit with a soft cloth. Warm water and mild detergent may be used occasionally to clean the pump and tubes.  WARNING: The [place] can be cold enough to cause serious injury, including full skin necrosis. Follow these Operating Instructions, and carefully read the Product Insert (see pouch on side of unit) and the Cold Therapy Pad Fitting Instructions (provided with each Cold Therapy Pad) prior to use.   SHOULDER SLING IMMOBILIZER   VIDEO Slingshot 2 Shoulder Brace Application - YouTube ---[email or web address]-[email or web address]  INSTRUCTIONS While supporting the injured arm, slide the forearm into the sling. Wrap the adjustable shoulder strap around the neck and shoulders and attach the strap end to the sling using  the "alligator strap tab."  Adjust the shoulder strap to the required length. Position the shoulder pad behind the neck. To secure the shoulder pad location (optional), pull the shoulder strap away from the shoulder pad, unfold the hook material on the top of the pad, then press the shoulder strap back onto the hook material to secure the pad in place. Attach the closure strap across the open top of the sling. Position the strap so that it holds the arm securely in the sling. Next, attach the thumb strap to the open end of the sling between the thumb and fingers. After sling has been fit, it may be easily removed and reapplied using the quick release buckle on shoulder strap. If a  neutral pillow or 15 abduction pillow is included, place the pillow at the waistline. Attach the sling to the pillow, lining up hook material on the pillow with the loop on sling. Adjust the waist strap to fit.  If waist strap is too long, cut it to fit. Use the small piece of double sided hook material (located on top of the pillow) to secure the strap end. Place the double sided hook material on the inside of the cut strap end and secure it to the waist strap.     If no pillow is included, attach the waist strap to the sling and adjust to fit.    Washing Instructions: Straps and sling must be removed and cleaned regularly depending on your activity level and perspiration. Hand wash straps and sling in cold water with mild detergent, rinse, air dry         Interscalene Nerve Block with Exparel   For your surgery you have received an Interscalene Nerve Block with Exparel. Nerve Blocks affect many types of nerves, including nerves that control movement, pain and normal sensation.  You may experience feelings such as numbness, tingling, heaviness, weakness or the inability to move your arm or the feeling or sensation that your arm has "fallen asleep". A nerve block with Exparel can last up to 5 days.  Usually the weakness wears off first.  The tingling and heaviness usually wear off next.  Finally you may start to notice pain.  Keep in mind that this may occur in any order.  Once a nerve block starts to wear off it is usually completely gone within 60 minutes. ISNB may cause mild shortness of breath, a hoarse voice, blurry vision, unequal pupils, or drooping of the face on the same side as the nerve block.  These symptoms will usually resolve with the numbness.  Very rarely the procedure itself can cause mild seizures. If needed, your surgeon will give you a prescription for pain medication.  It will take about 60 minutes for the oral pain medication to become fully effective.  So, it is recommended  that you start taking this medication before the nerve block first begins to wear off, or when you first begin to feel discomfort. Take your pain medication only as prescribed.  Pain medication can cause sedation and decrease your breathing if you take more than you need for the level of pain that you have. Nausea is a common side effect of many pain medications.  You may want to eat something before taking your pain medicine to prevent nausea. After an Interscalene nerve block, you cannot feel pain, pressure or extremes in temperature in the effected arm.  Because your arm is numb it is at an increased risk for injury.  To decrease the possibility of injury, please practice the following:  While you are awake change the position of your arm frequently to prevent too much pressure on any one area for prolonged periods of time.  If you have a cast or tight dressing, check the color or your fingers every couple of hours.  Call your surgeon with the appearance of any discoloration (white or blue). If you are given a sling to wear before you go home, please wear it  at all times until the block has completely worn off.  Do not get up at night without your sling. Please contact [facility] or your surgeon if you do not begin to regain sensation after 7 days from the surgery.  Anesthesia may be contacted by calling the Same Day Surgery Department, Mon. through Fri., 6 am to 4 pm at [phone].   If you experience any other problems or concerns, please contact your surgeon's office. If you experience severe or prolonged shortness of breath go to the nearest emergency department.

## 2021-11-15 NOTE — Anesthesia Procedure Notes (Signed)
Anesthesia Regional Block: Interscalene brachial plexus block   Pre-Anesthetic Checklist: , timeout performed,  Correct Patient, Correct Site, Correct Laterality,  Correct Procedure, Correct Position, site marked,  Risks and benefits discussed,  Surgical consent,  Pre-op evaluation,  At surgeon's request and post-op pain management  Laterality: Right and Upper  Prep: chloraprep       Needles:  Injection technique: Single-shot  Needle Type: Stimiplex     Needle Length: 5cm  Needle Gauge: 22     Additional Needles:   Procedures:,,,, ultrasound used (permanent image in chart),,    Narrative:  Start time: 11/15/2021 7:34 AM End time: 11/15/2021 7:36 AM Injection made incrementally with aspirations every 5 mL.  Performed by: Personally  Anesthesiologist: Martha Clan, MD  Additional Notes: Functioning IV was confirmed and monitors were applied.  A 31m 22ga Stimuplex needle was used. Sterile prep and drape,hand hygiene and sterile gloves were used.  Negative aspiration and negative test dose prior to incremental administration of local anesthetic. The patient tolerated the procedure well.

## 2021-11-16 ENCOUNTER — Encounter: Payer: Self-pay | Admitting: Surgery

## 2021-11-17 DIAGNOSIS — Z471 Aftercare following joint replacement surgery: Secondary | ICD-10-CM | POA: Diagnosis not present

## 2021-11-18 ENCOUNTER — Ambulatory Visit: Payer: BC Managed Care – PPO | Admitting: Family Medicine

## 2021-11-20 DIAGNOSIS — E119 Type 2 diabetes mellitus without complications: Secondary | ICD-10-CM | POA: Diagnosis not present

## 2021-11-21 DIAGNOSIS — Z471 Aftercare following joint replacement surgery: Secondary | ICD-10-CM | POA: Diagnosis not present

## 2021-11-23 DIAGNOSIS — Z471 Aftercare following joint replacement surgery: Secondary | ICD-10-CM | POA: Diagnosis not present

## 2021-11-25 DIAGNOSIS — Z471 Aftercare following joint replacement surgery: Secondary | ICD-10-CM | POA: Diagnosis not present

## 2021-11-28 DIAGNOSIS — Z471 Aftercare following joint replacement surgery: Secondary | ICD-10-CM | POA: Diagnosis not present

## 2021-11-30 DIAGNOSIS — M6281 Muscle weakness (generalized): Secondary | ICD-10-CM | POA: Diagnosis not present

## 2021-11-30 DIAGNOSIS — Z96611 Presence of right artificial shoulder joint: Secondary | ICD-10-CM | POA: Diagnosis not present

## 2021-11-30 DIAGNOSIS — M25611 Stiffness of right shoulder, not elsewhere classified: Secondary | ICD-10-CM | POA: Diagnosis not present

## 2021-11-30 DIAGNOSIS — M25511 Pain in right shoulder: Secondary | ICD-10-CM | POA: Diagnosis not present

## 2021-12-08 ENCOUNTER — Other Ambulatory Visit: Payer: Self-pay | Admitting: Family Medicine

## 2021-12-08 DIAGNOSIS — E119 Type 2 diabetes mellitus without complications: Secondary | ICD-10-CM

## 2021-12-08 NOTE — Telephone Encounter (Signed)
Requested medication (s) are due for refill today: -  Requested medication (s) are on the active medication list: yes  Last refill:  11/15/21 last RX by hospital MD  Future visit scheduled: yes  Notes to clinic:  prescriber not at this practice   Requested Prescriptions  Pending Prescriptions Disp Refills   glipiZIDE (GLUCOTROL) 5 MG tablet [Pharmacy Med Name: GLIPIZIDE 5 MG TABLET] 360 tablet     Sig: TAKE 1-2 TABLETS (5-10 MG TOTAL) BY MOUTH 2 (TWO) TIMES DAILY BEFORE A MEAL.     Endocrinology:  Diabetes - Sulfonylureas Passed - 12/08/2021  3:35 AM      Passed - HBA1C is between 0 and 7.9 and within 180 days    Hgb A1c MFr Bld  Date Value Ref Range Status  07/19/2021 6.4 (H) 4.8 - 5.6 % Final    Comment:             Prediabetes: 5.7 - 6.4          Diabetes: >6.4          Glycemic control for adults with diabetes: <7.0          Passed - Cr in normal range and within 360 days    Creatinine, Ser  Date Value Ref Range Status  11/08/2021 0.87 0.61 - 1.24 mg/dL Final         Passed - Valid encounter within last 6 months    Recent Outpatient Visits           4 months ago Essential hypertension   [facility] [place], Deanna C, MD   10 months ago Need for pneumococcal vaccination   [facility] [place], Deanna C, MD   1 year ago Essential hypertension   [facility] [place], Deanna C, MD   1 year ago Type 2 diabetes mellitus without complication, without long-term current use of insulin ([place])   [facility] [place], Deanna C, MD   2 years ago Taking medication for chronic disease   [facility] [place], [place], MD       Future Appointments             In 3 weeks Juline Patch, MD [facility], [facility]

## 2021-12-12 DIAGNOSIS — M25611 Stiffness of right shoulder, not elsewhere classified: Secondary | ICD-10-CM | POA: Diagnosis not present

## 2021-12-12 DIAGNOSIS — Z96611 Presence of right artificial shoulder joint: Secondary | ICD-10-CM | POA: Diagnosis not present

## 2021-12-12 DIAGNOSIS — M6281 Muscle weakness (generalized): Secondary | ICD-10-CM | POA: Diagnosis not present

## 2021-12-12 DIAGNOSIS — M25511 Pain in right shoulder: Secondary | ICD-10-CM | POA: Diagnosis not present

## 2021-12-19 ENCOUNTER — Encounter (INDEPENDENT_AMBULATORY_CARE_PROVIDER_SITE_OTHER): Payer: Self-pay

## 2021-12-19 DIAGNOSIS — M6281 Muscle weakness (generalized): Secondary | ICD-10-CM | POA: Diagnosis not present

## 2021-12-19 DIAGNOSIS — M25611 Stiffness of right shoulder, not elsewhere classified: Secondary | ICD-10-CM | POA: Diagnosis not present

## 2021-12-19 DIAGNOSIS — Z96611 Presence of right artificial shoulder joint: Secondary | ICD-10-CM | POA: Diagnosis not present

## 2021-12-19 DIAGNOSIS — M25511 Pain in right shoulder: Secondary | ICD-10-CM | POA: Diagnosis not present

## 2022-01-03 ENCOUNTER — Encounter: Payer: Self-pay | Admitting: Family Medicine

## 2022-01-03 ENCOUNTER — Ambulatory Visit (INDEPENDENT_AMBULATORY_CARE_PROVIDER_SITE_OTHER): Payer: Medicare Other | Admitting: Family Medicine

## 2022-01-03 VITALS — BP 118/72 | HR 68 | Ht 73.0 in | Wt 248.0 lb

## 2022-01-03 DIAGNOSIS — E7801 Familial hypercholesterolemia: Secondary | ICD-10-CM

## 2022-01-03 DIAGNOSIS — I10 Essential (primary) hypertension: Secondary | ICD-10-CM | POA: Diagnosis not present

## 2022-01-03 DIAGNOSIS — F3341 Major depressive disorder, recurrent, in partial remission: Secondary | ICD-10-CM

## 2022-01-03 DIAGNOSIS — K219 Gastro-esophageal reflux disease without esophagitis: Secondary | ICD-10-CM | POA: Diagnosis not present

## 2022-01-03 DIAGNOSIS — Z8659 Personal history of other mental and behavioral disorders: Secondary | ICD-10-CM

## 2022-01-03 DIAGNOSIS — F419 Anxiety disorder, unspecified: Secondary | ICD-10-CM

## 2022-01-03 MED ORDER — LOSARTAN POTASSIUM 100 MG PO TABS: 50.0000 mg | ORAL_TABLET | Freq: Every day | ORAL | 1 refills | Status: DC

## 2022-01-03 MED ORDER — ROSUVASTATIN CALCIUM 10 MG PO TABS: 10.0000 mg | ORAL_TABLET | Freq: Every day | ORAL | 1 refills | Status: DC

## 2022-01-03 MED ORDER — TRIAMTERENE-HCTZ 37.5-25 MG PO CAPS: 1.0000 | ORAL_CAPSULE | Freq: Every day | ORAL | 0 refills | Status: DC

## 2022-01-03 MED ORDER — AMLODIPINE BESYLATE 10 MG PO TABS: 10.0000 mg | ORAL_TABLET | Freq: Every day | ORAL | 1 refills | Status: DC

## 2022-01-03 MED ORDER — BUPROPION HCL 75 MG PO TABS: 75.0000 mg | ORAL_TABLET | Freq: Two times a day (BID) | ORAL | 1 refills | Status: DC

## 2022-01-03 MED ORDER — SERTRALINE HCL 50 MG PO TABS: 50.0000 mg | ORAL_TABLET | Freq: Every day | ORAL | 1 refills | Status: DC

## 2022-01-03 MED ORDER — PANTOPRAZOLE SODIUM 40 MG PO TBEC: 40.0000 mg | DELAYED_RELEASE_TABLET | Freq: Every day | ORAL | 1 refills | Status: DC

## 2022-01-03 NOTE — Progress Notes (Signed)
Date:  01/03/2022   Name:  VINAL ROSENGRANT   DOB:  Jul 11, 1955   MRN:  [id number]   Chief Complaint: Hypertension, Gastroesophageal Reflux, Hyperlipidemia, Depression, and Anxiety  Hypertension This is a chronic problem. The current episode started more than 1 year ago. The problem has been waxing and waning since onset. The problem is controlled. Associated symptoms include anxiety. Pertinent negatives include no blurred vision, chest pain, headaches, malaise/fatigue, neck pain, orthopnea, palpitations, peripheral edema, PND, shortness of breath or sweats. There are no associated agents to hypertension. There are no known risk factors for coronary artery disease. Past treatments include diuretics, calcium channel blockers and angiotensin blockers. There is no history of angina, kidney disease, CAD/MI, CVA, heart failure, left ventricular hypertrophy, PVD or retinopathy. There is no history of chronic renal disease, a hypertension causing med or renovascular disease.  Gastroesophageal Reflux He reports no abdominal pain, no chest pain, no coughing, no dysphagia, no heartburn, no hoarse voice, no nausea, no sore throat or no wheezing. This is a chronic problem. The current episode started more than 1 year ago. The problem has been gradually improving. The symptoms are aggravated by certain foods. Pertinent negatives include no fatigue. He has tried a PPI for the symptoms. The treatment provided moderate relief.  Hyperlipidemia This is a chronic problem. The current episode started more than 1 year ago. The problem is controlled. Recent lipid tests were reviewed and are normal. He has no history of chronic renal disease. Pertinent negatives include no chest pain, myalgias or shortness of breath. Current antihyperlipidemic treatment includes statins. The current treatment provides moderate improvement of lipids. There are no compliance problems.  Risk factors for coronary artery disease include  dyslipidemia.  Depression        This is a chronic problem.  The current episode started more than 1 year ago.   The problem has been gradually improving since onset.  Associated symptoms include no decreased concentration, no fatigue, no helplessness, no hopelessness, does not have insomnia, not irritable, no restlessness, no decreased interest, no appetite change, no body aches, no myalgias, no headaches, no indigestion, not sad and no suicidal ideas.     The symptoms are aggravated by nothing.  Past treatments include SSRIs - Selective serotonin reuptake inhibitors.  Compliance with treatment is variable.  Previous treatment provided moderate relief.  Past medical history includes anxiety.   Anxiety Presents for follow-up visit. Patient reports no chest pain, decreased concentration, dizziness, excessive worry, insomnia, nausea, nervous/anxious behavior, palpitations, restlessness, shortness of breath or suicidal ideas.      Lab Results  Component Value Date   NA 138 11/08/2021   K 3.6 11/08/2021   CO2 28 11/08/2021   GLUCOSE 139 (H) 11/08/2021   BUN 23 11/08/2021   CREATININE 0.87 11/08/2021   CALCIUM 9.3 11/08/2021   EGFR 95 07/12/2020   GFRNONAA >60 11/08/2021   Lab Results  Component Value Date   CHOL 143 07/12/2020   HDL 46 07/12/2020   LDLCALC 81 07/12/2020   TRIG 84 07/12/2020   CHOLHDL 3.3 07/24/2016   Lab Results  Component Value Date   TSH 2.620 11/07/2018   Lab Results  Component Value Date   HGBA1C 6.4 (H) 07/19/2021   Lab Results  Component Value Date   WBC 8.3 11/08/2021   HGB 14.2 11/08/2021   HCT 42.4 11/08/2021   MCV 85.7 11/08/2021   PLT 188 11/08/2021   Lab Results  Component Value Date   ALT  22 11/08/2021   AST 18 11/08/2021   ALKPHOS 61 11/08/2021   BILITOT 0.9 11/08/2021   No results found for: "25OHVITD2", "25OHVITD3", "VD25OH"   Review of Systems  Constitutional:  Negative for appetite change, chills, fatigue, fever and  malaise/fatigue.  HENT:  Negative for drooling, ear discharge, ear pain, hoarse voice and sore throat.   Eyes:  Negative for blurred vision.  Respiratory:  Negative for cough, shortness of breath and wheezing.   Cardiovascular:  Negative for chest pain, palpitations, orthopnea, leg swelling and PND.  Gastrointestinal:  Negative for abdominal pain, blood in stool, constipation, diarrhea, dysphagia, heartburn and nausea.  Endocrine: Negative for polydipsia.  Genitourinary:  Negative for dysuria, frequency, hematuria and urgency.  Musculoskeletal:  Negative for back pain, myalgias and neck pain.  Skin:  Negative for rash.  Allergic/Immunologic: Negative for environmental allergies.  Neurological:  Negative for dizziness and headaches.  Hematological:  Does not bruise/bleed easily.  Psychiatric/Behavioral:  Positive for depression. Negative for decreased concentration and suicidal ideas. The patient is not nervous/anxious and does not have insomnia.     Patient Active Problem List   Diagnosis Date Noted   Varicose veins of leg with pain, left 12/30/2018   Carpal tunnel syndrome, right 08/09/2018   Primary osteoarthritis of left shoulder 08/09/2018   Rotator cuff tendinitis, left 08/09/2018   Strain of left knee 08/06/2017   Obesity (BMI 35.0-39.9 without comorbidity) 08/03/2017   Status post arthroscopy of left knee 08/03/2017   Complex tear of medial meniscus of left knee as current injury 07/26/2017   Chest pain 07/24/2016   Uncontrolled type 2 diabetes mellitus without complication, without long-term current use of insulin 07/24/2016   Status post total hip replacement, left 01/25/2016   Benign essential HTN 07/21/2015   Depression 07/21/2015   Hyperlipidemia, mixed 07/21/2015   GERD (gastroesophageal reflux disease) 12/04/2014   Biceps tendinitis 07/21/2014   Impingement syndrome of shoulder 07/21/2014   Primary osteoarthritis of right knee 08/14/2013   Neuritis or radiculitis due  to rupture of lumbar intervertebral disc 08/14/2013    No Known Allergies  Past Surgical History:  Procedure Laterality Date   CARPAL TUNNEL RELEASE Right 01/02/2019   Procedure: CARPAL TUNNEL RELEASE ENDOSCOPIC;  Surgeon: Corky Mull, MD;  Location: [facility];  Service: Orthopedics;  Laterality: Right;   CHOLECYSTECTOMY  1988   CHONDROPLASTY Right 10/05/2009   knee- shaving patellofemoral joint   COLONOSCOPY  2010   COLONOSCOPY WITH PROPOFOL N/A 10/12/2021   Procedure: COLONOSCOPY WITH PROPOFOL;  Surgeon: Robert Bellow, MD;  Location: [facility];  Service: Endoscopy;  Laterality: N/A;   ENDOVENOUS ABLATION SAPHENOUS VEIN W/ LASER     INJECTION KNEE Left 12/23/2019   Procedure: KNEE INJECTION;  Surgeon: Corky Mull, MD;  Location: [facility];  Service: Orthopedics;  Laterality: Left;   JOINT REPLACEMENT Left    hip   KNEE ARTHROSCOPY Left 07/25/2017   Procedure: Arthroscopic partial medial meniscectomy; arthroscopic debridement; and arthroscopic abrasion chondroplasty of medial femoral condyle, lateral tibial plateau, and femoral trochlea; left knee.;  Surgeon: Corky Mull, MD;  Location: [facility];  Service: Orthopedics;  Laterality: Left;   KNEE ARTHROSCOPY WITH MEDIAL MENISECTOMY Right    Partial medial and lateral menisectomy   REVERSE SHOULDER ARTHROPLASTY Right 11/15/2021   Procedure: ANATOMIC  SHOULDER ARTHROPLASTY WITH BICEPS TENODESIS;  Surgeon: Corky Mull, MD;  Location: [facility];  Service: Orthopedics;  Laterality: Right;   SHOULDER ARTHROSCOPY Left 08/10/2014   Procedure: ARTHROSCOPY  SHOULDER WITH LIMITED DEBRIDEMENT, REMOVAL OF LOOSE BODY AND RELEASE OF LONG END BICEPS TENDON;  Surgeon: Leanor Kail, MD;  Location: [facility];  Service: Orthopedics;  Laterality: Left;   TOTAL HIP ARTHROPLASTY Left 01/25/2016   Procedure: TOTAL HIP ARTHROPLASTY;  Surgeon: Corky Mull, MD;  Location: [facility];  Service: Orthopedics;  Laterality: Left;    TOTAL KNEE ARTHROPLASTY Right 12/23/2019   Procedure: TOTAL KNEE ARTHROPLASTY;  Surgeon: Corky Mull, MD;  Location: [facility];  Service: Orthopedics;  Laterality: Right;   TOTAL KNEE ARTHROPLASTY Left 11/11/2020   Procedure: TOTAL KNEE ARTHROPLASTY;  Surgeon: Corky Mull, MD;  Location: [facility];  Service: Orthopedics;  Laterality: Left;    Social History   Tobacco Use   Smoking status: Never   Smokeless tobacco: Never  Vaping Use   Vaping Use: Never used  Substance Use Topics   Alcohol use: Yes    Comment: rare   Drug use: No     Medication list has been reviewed and updated.  Current Meds  Medication Sig   ACCU-CHEK AVIVA PLUS test strip TEST AS DIRECTED   amLODipine (NORVASC) 10 MG tablet Take 1 tablet (10 mg total) by mouth daily.   aspirin EC 81 MG tablet Take 81 mg by mouth daily. Swallow whole.   buPROPion (WELLBUTRIN) 75 MG tablet Take 1 tablet (75 mg total) by mouth 2 (two) times daily.   cyclobenzaprine (FLEXERIL) 10 MG tablet Take 10 mg by mouth 2 (two) times daily as needed.   fluticasone (FLONASE) 50 MCG/ACT nasal spray Place 2 sprays into both nostrils daily as needed for allergies or rhinitis.   gabapentin (NEURONTIN) 300 MG capsule Take 3 capsules (900 mg total) by mouth 3 (three) times daily.   glipiZIDE (GLUCOTROL) 5 MG tablet Take 2 tablets (10 mg total) by mouth 2 (two) times daily before a meal.   losartan (COZAAR) 100 MG tablet Take 0.5 tablets (50 mg total) by mouth daily.   meclizine (ANTIVERT) 25 MG tablet Take 25 mg by mouth 3 (three) times daily as needed for dizziness.   metFORMIN (GLUCOPHAGE) 500 MG tablet Take 2 tablets (1,000 mg total) by mouth 2 (two) times daily with a meal.   oxyCODONE (ROXICODONE) 5 MG immediate release tablet Take 1-2 tablets (5-10 mg total) by mouth every 4 (four) hours as needed for moderate pain or severe pain.   pantoprazole (PROTONIX) 40 MG tablet Take 1 tablet (40 mg total) by mouth daily.   rosuvastatin (CRESTOR) 10  MG tablet Take 1 tablet (10 mg total) by mouth daily.   sertraline (ZOLOFT) 50 MG tablet Take 1 tablet (50 mg total) by mouth daily.   triamcinolone cream (KENALOG) 0.1 % Apply 1 application. topically 2 (two) times daily.   triamterene-hydrochlorothiazide (DYAZIDE) 37.5-25 MG capsule TAKE 1 EACH (1 CAPSULE TOTAL) BY MOUTH DAILY.       01/03/2022   10:30 AM 07/19/2021    9:16 AM 01/17/2021    1:45 PM 07/12/2020   10:14 AM  GAD 7 : Generalized Anxiety Score  Nervous, Anxious, on Edge 0 0 0 0  Control/stop worrying 0 0 0 0  Worry too much - different things 0 0 0 0  Trouble relaxing 0 0 0 0  Restless 0 0 0 0  Easily annoyed or irritable 0 0 1 0  Afraid - awful might happen 0 0 0 0  Total GAD 7 Score 0 0 1 0  Anxiety Difficulty Not difficult at all  Not difficult at all         01/03/2022   10:30 AM 07/19/2021    9:16 AM 01/17/2021    1:45 PM  Depression screen PHQ 2/9  Decreased Interest 0 0 0  Down, Depressed, Hopeless 0 0 0  PHQ - 2 Score 0 0 0  Altered sleeping 0 0 0  Tired, decreased energy 0 0 1  Change in appetite 0 0 1  Feeling bad or failure about yourself  0 0 0  Trouble concentrating 0 0 0  Moving slowly or fidgety/restless 0 0 0  Suicidal thoughts 0 0 0  PHQ-9 Score 0 0 2  Difficult doing work/chores Not difficult at all Not difficult at all Not difficult at all    BP Readings from Last 3 Encounters:  01/03/22 118/72  11/15/21 (!) 148/76  11/10/21 (!) 177/89    Physical Exam Vitals and nursing note reviewed.  Constitutional:      General: He is not irritable. HENT:     Head: Normocephalic.     Right Ear: Tympanic membrane, ear canal and external ear normal.     Left Ear: Tympanic membrane, ear canal and external ear normal.     Nose: Nose normal. No congestion or rhinorrhea.     Mouth/Throat:     Mouth: Mucous membranes are moist.     Pharynx: No oropharyngeal exudate or posterior oropharyngeal erythema.  Eyes:     General: No scleral icterus.        Right eye: No discharge.        Left eye: No discharge.     Conjunctiva/sclera: Conjunctivae normal.     Pupils: Pupils are equal, round, and reactive to light.  Neck:     Thyroid: No thyromegaly.     Vascular: No JVD.     Trachea: No tracheal deviation.  Cardiovascular:     Rate and Rhythm: Normal rate and regular rhythm.     Heart sounds: Normal heart sounds. No murmur heard.    No friction rub. No gallop.  Pulmonary:     Effort: No respiratory distress.     Breath sounds: Normal breath sounds. No wheezing, rhonchi or rales.  Chest:     Chest wall: No tenderness.  Abdominal:     General: Bowel sounds are normal.     Palpations: Abdomen is soft. There is no mass.     Tenderness: There is no abdominal tenderness. There is no guarding or rebound.  Musculoskeletal:        General: No tenderness. Normal range of motion.     Cervical back: Normal range of motion and neck supple.  Lymphadenopathy:     Cervical: No cervical adenopathy.  Skin:    General: Skin is warm.     Findings: No rash.  Neurological:     Mental Status: He is alert and oriented to person, place, and time.     Cranial Nerves: No cranial nerve deficit.     Deep Tendon Reflexes: Reflexes are normal and symmetric.  Psychiatric:        Mood and Affect: Mood normal.        Behavior: Behavior normal.        Thought Content: Thought content normal.        Judgment: Judgment normal.     Wt Readings from Last 3 Encounters:  01/03/22 248 lb (112.5 kg)  11/15/21 260 lb (117.9 kg)  11/08/21 260 lb (117.9 kg)    BP 118/72  Pulse 68   Ht _0  (1.854 m)   Wt 248 lb (112.5 kg)   SpO2 98%   BMI 32.72 kg/m   Assessment and Plan:  1. Essential hypertension Chronic.  Controlled.  Stable.  Blood pressure today is 118/72.  Tolerating medication well.  Asymptomatic.  Continue amlodipine 10 mg once a day losartan 100 mg 1/2 tablet daily and triamterene hydrochlorothiazide 1 tablet daily.  We will recheck in 6 months  review of previous labs acceptable - amLODipine (NORVASC) 10 MG tablet; Take 1 tablet (10 mg total) by mouth daily.  Dispense: 90 tablet; Refill: 1 - losartan (COZAAR) 100 MG tablet; Take 0.5 tablets (50 mg total) by mouth daily.  Dispense: 45 tablet; Refill: 1 - triamterene-hydrochlorothiazide (DYAZIDE) 37.5-25 MG capsule; Take 1 each (1 capsule total) by mouth daily.  Dispense: 90 capsule; Refill: 0  2. Recurrent major depressive disorder, in partial remission (HCC) Chronic.  Controlled.  Stable.  PHQ is 0.  GAD score is 0.  Continue bupropion 75 mg twice a day sertraline 50 mg once a day.  We will recheck in 6 months. - buPROPion (WELLBUTRIN) 75 MG tablet; Take 1 tablet (75 mg total) by mouth 2 (two) times daily.  Dispense: 180 tablet; Refill: 1 - sertraline (ZOLOFT) 50 MG tablet; Take 1 tablet (50 mg total) by mouth daily.  Dispense: 90 tablet; Refill: 1  3. Gastroesophageal reflux disease, unspecified whether esophagitis present Chronic.  Controlled.  Stable.  Continue pantoprazole 40 mg once a day. - pantoprazole (PROTONIX) 40 MG tablet; Take 1 tablet (40 mg total) by mouth daily.  Dispense: 90 tablet; Refill: 1  4. Familial hypercholesterolemia Chronic.  Controlled.  Stable.  Continue rosuvastatin 10 mg once a day.  We will check lipid panel for current LDL surveillance. - rosuvastatin (CRESTOR) 10 MG tablet; Take 1 tablet (10 mg total) by mouth daily.  Dispense: 90 tablet; Refill: 1 - Lipid Panel With LDL/HDL Ratio  5. Anxiety .  Controlled.  Stable.  GAD score is 0.  Continue sertraline 50 mg once a day. - sertraline (ZOLOFT) 50 MG tablet; Take 1 tablet (50 mg total) by mouth daily.  Dispense: 90 tablet; Refill: 1  6. History of posttraumatic stress disorder (PTSD) As noted above. - sertraline (ZOLOFT) 50 MG tablet; Take 1 tablet (50 mg total) by mouth daily.  Dispense: 90 tablet; Refill: 1    Otilio Miu, MD

## 2022-01-04 LAB — LIPID PANEL WITH LDL/HDL RATIO
Cholesterol, Total: 144 mg/dL (ref 100–199)
HDL: 40 mg/dL (ref >39)
LDL Chol Calc (NIH): 75 mg/dL (ref 0–99)
LDL/HDL Ratio: 1.9 ratio (ref 0.0–3.6)
Triglycerides: 168 mg/dL — ABNORMAL HIGH (ref 0–149)
VLDL Cholesterol Cal: 29 mg/dL (ref 5–40)

## 2022-01-23 ENCOUNTER — Ambulatory Visit: Payer: BC Managed Care – PPO | Admitting: Family Medicine

## 2022-01-25 DIAGNOSIS — M25511 Pain in right shoulder: Secondary | ICD-10-CM | POA: Diagnosis not present

## 2022-01-25 DIAGNOSIS — M6281 Muscle weakness (generalized): Secondary | ICD-10-CM | POA: Diagnosis not present

## 2022-01-25 DIAGNOSIS — Z96611 Presence of right artificial shoulder joint: Secondary | ICD-10-CM | POA: Diagnosis not present

## 2022-01-25 DIAGNOSIS — M25611 Stiffness of right shoulder, not elsewhere classified: Secondary | ICD-10-CM | POA: Diagnosis not present

## 2022-01-27 DIAGNOSIS — M6281 Muscle weakness (generalized): Secondary | ICD-10-CM | POA: Diagnosis not present

## 2022-01-27 DIAGNOSIS — M25611 Stiffness of right shoulder, not elsewhere classified: Secondary | ICD-10-CM | POA: Diagnosis not present

## 2022-01-27 DIAGNOSIS — M25511 Pain in right shoulder: Secondary | ICD-10-CM | POA: Diagnosis not present

## 2022-01-27 DIAGNOSIS — Z96611 Presence of right artificial shoulder joint: Secondary | ICD-10-CM | POA: Diagnosis not present

## 2022-01-31 DIAGNOSIS — M25511 Pain in right shoulder: Secondary | ICD-10-CM | POA: Diagnosis not present

## 2022-01-31 DIAGNOSIS — M25611 Stiffness of right shoulder, not elsewhere classified: Secondary | ICD-10-CM | POA: Diagnosis not present

## 2022-01-31 DIAGNOSIS — M6281 Muscle weakness (generalized): Secondary | ICD-10-CM | POA: Diagnosis not present

## 2022-01-31 DIAGNOSIS — Z96611 Presence of right artificial shoulder joint: Secondary | ICD-10-CM | POA: Diagnosis not present

## 2022-02-01 DIAGNOSIS — E782 Mixed hyperlipidemia: Secondary | ICD-10-CM | POA: Diagnosis not present

## 2022-02-01 DIAGNOSIS — G4733 Obstructive sleep apnea (adult) (pediatric): Secondary | ICD-10-CM | POA: Diagnosis not present

## 2022-02-01 DIAGNOSIS — I10 Essential (primary) hypertension: Secondary | ICD-10-CM | POA: Diagnosis not present

## 2022-02-02 DIAGNOSIS — M25511 Pain in right shoulder: Secondary | ICD-10-CM | POA: Diagnosis not present

## 2022-02-02 DIAGNOSIS — Z96611 Presence of right artificial shoulder joint: Secondary | ICD-10-CM | POA: Diagnosis not present

## 2022-02-02 DIAGNOSIS — M25611 Stiffness of right shoulder, not elsewhere classified: Secondary | ICD-10-CM | POA: Diagnosis not present

## 2022-02-02 DIAGNOSIS — M6281 Muscle weakness (generalized): Secondary | ICD-10-CM | POA: Diagnosis not present

## 2022-02-17 DIAGNOSIS — I152 Hypertension secondary to endocrine disorders: Secondary | ICD-10-CM | POA: Diagnosis not present

## 2022-02-17 DIAGNOSIS — E1159 Type 2 diabetes mellitus with other circulatory complications: Secondary | ICD-10-CM | POA: Diagnosis not present

## 2022-02-17 DIAGNOSIS — E1169 Type 2 diabetes mellitus with other specified complication: Secondary | ICD-10-CM | POA: Diagnosis not present

## 2022-02-17 DIAGNOSIS — E1142 Type 2 diabetes mellitus with diabetic polyneuropathy: Secondary | ICD-10-CM | POA: Diagnosis not present

## 2022-02-22 DIAGNOSIS — M25611 Stiffness of right shoulder, not elsewhere classified: Secondary | ICD-10-CM | POA: Diagnosis not present

## 2022-02-22 DIAGNOSIS — Z96611 Presence of right artificial shoulder joint: Secondary | ICD-10-CM | POA: Diagnosis not present

## 2022-02-22 DIAGNOSIS — M25511 Pain in right shoulder: Secondary | ICD-10-CM | POA: Diagnosis not present

## 2022-02-22 DIAGNOSIS — M6281 Muscle weakness (generalized): Secondary | ICD-10-CM | POA: Diagnosis not present

## 2022-02-24 DIAGNOSIS — M6281 Muscle weakness (generalized): Secondary | ICD-10-CM | POA: Diagnosis not present

## 2022-02-24 DIAGNOSIS — Z96611 Presence of right artificial shoulder joint: Secondary | ICD-10-CM | POA: Diagnosis not present

## 2022-02-24 DIAGNOSIS — M25611 Stiffness of right shoulder, not elsewhere classified: Secondary | ICD-10-CM | POA: Diagnosis not present

## 2022-02-24 DIAGNOSIS — M25511 Pain in right shoulder: Secondary | ICD-10-CM | POA: Diagnosis not present

## 2022-02-28 DIAGNOSIS — M25511 Pain in right shoulder: Secondary | ICD-10-CM | POA: Diagnosis not present

## 2022-03-03 DIAGNOSIS — M25611 Stiffness of right shoulder, not elsewhere classified: Secondary | ICD-10-CM | POA: Diagnosis not present

## 2022-03-03 DIAGNOSIS — M25511 Pain in right shoulder: Secondary | ICD-10-CM | POA: Diagnosis not present

## 2022-03-03 DIAGNOSIS — M6281 Muscle weakness (generalized): Secondary | ICD-10-CM | POA: Diagnosis not present

## 2022-03-03 DIAGNOSIS — Z96611 Presence of right artificial shoulder joint: Secondary | ICD-10-CM | POA: Diagnosis not present

## 2022-03-07 IMAGING — DX DG KNEE 1-2V PORT*R*
2 series · 2 of 2 positions shown · non-contrast
Comparison: MRI 09/17/2009.

CLINICAL DATA: Postop right knee surgery.

EXAM:
PORTABLE RIGHT KNEE - 1-2 VIEW

[knee ap]
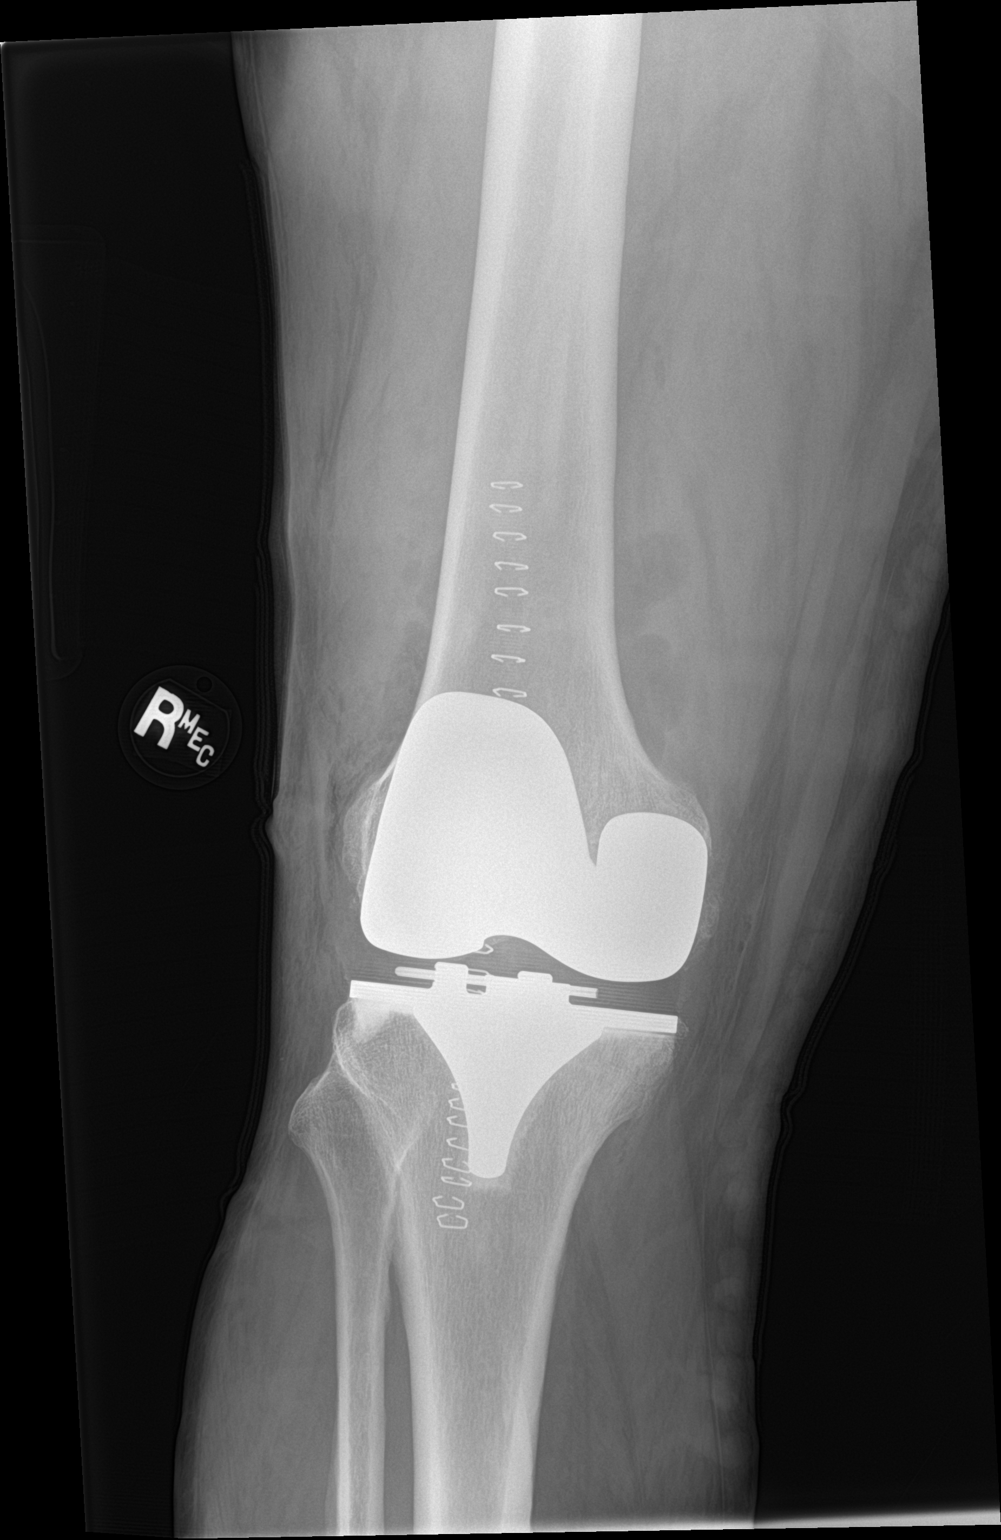

[knee lat]
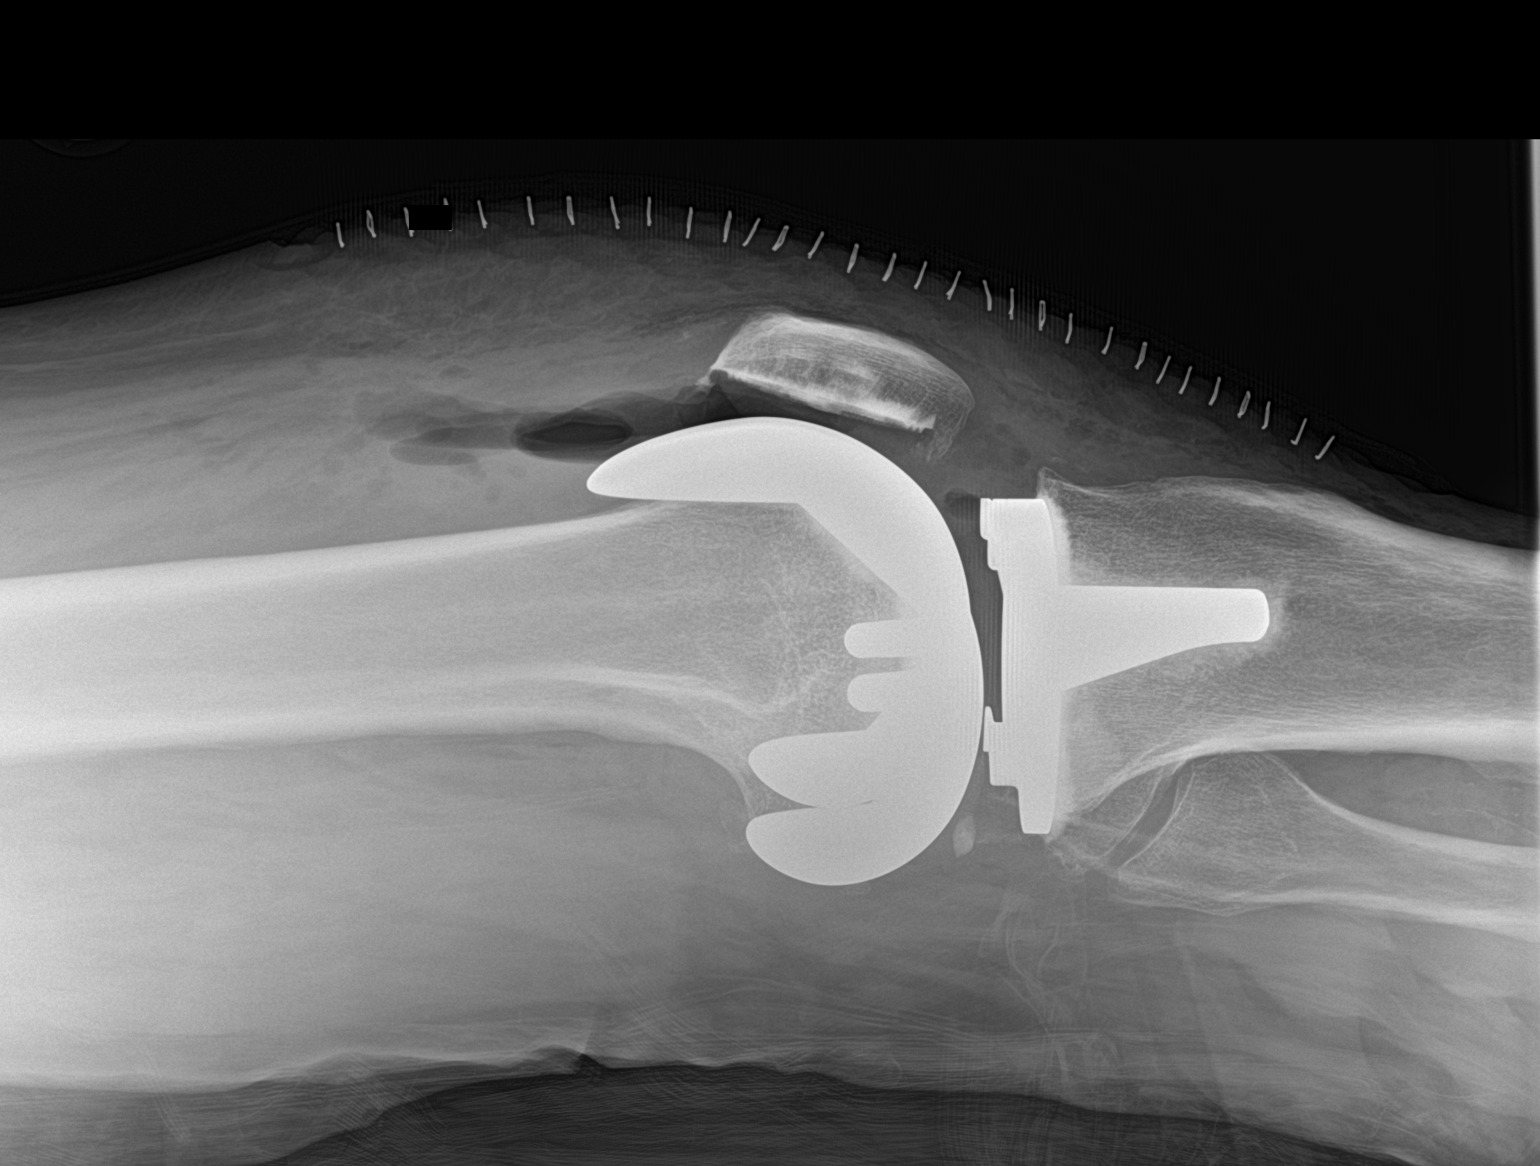

[2 of 2 positions shown; findings below may reference images not displayed]

FINDINGS: Total right knee replacement. Hardware intact. Anatomic alignment.
No acute bony abnormality.
IMPRESSION: Total right knee replacement with anatomic alignment.

## 2022-03-08 DIAGNOSIS — Z96611 Presence of right artificial shoulder joint: Secondary | ICD-10-CM | POA: Diagnosis not present

## 2022-03-08 DIAGNOSIS — M25611 Stiffness of right shoulder, not elsewhere classified: Secondary | ICD-10-CM | POA: Diagnosis not present

## 2022-03-08 DIAGNOSIS — M6281 Muscle weakness (generalized): Secondary | ICD-10-CM | POA: Diagnosis not present

## 2022-03-08 DIAGNOSIS — M25511 Pain in right shoulder: Secondary | ICD-10-CM | POA: Diagnosis not present

## 2022-03-10 DIAGNOSIS — M6281 Muscle weakness (generalized): Secondary | ICD-10-CM | POA: Diagnosis not present

## 2022-03-10 DIAGNOSIS — M25511 Pain in right shoulder: Secondary | ICD-10-CM | POA: Diagnosis not present

## 2022-03-10 DIAGNOSIS — Z96611 Presence of right artificial shoulder joint: Secondary | ICD-10-CM | POA: Diagnosis not present

## 2022-03-10 DIAGNOSIS — M25611 Stiffness of right shoulder, not elsewhere classified: Secondary | ICD-10-CM | POA: Diagnosis not present

## 2022-03-13 DIAGNOSIS — M9931 Osseous stenosis of neural canal of cervical region: Secondary | ICD-10-CM | POA: Diagnosis not present

## 2022-03-13 DIAGNOSIS — E1165 Type 2 diabetes mellitus with hyperglycemia: Secondary | ICD-10-CM | POA: Diagnosis not present

## 2022-03-14 DIAGNOSIS — M25611 Stiffness of right shoulder, not elsewhere classified: Secondary | ICD-10-CM | POA: Diagnosis not present

## 2022-03-14 DIAGNOSIS — M6281 Muscle weakness (generalized): Secondary | ICD-10-CM | POA: Diagnosis not present

## 2022-03-14 DIAGNOSIS — M25511 Pain in right shoulder: Secondary | ICD-10-CM | POA: Diagnosis not present

## 2022-03-14 DIAGNOSIS — Z96611 Presence of right artificial shoulder joint: Secondary | ICD-10-CM | POA: Diagnosis not present

## 2022-03-16 DIAGNOSIS — Z96611 Presence of right artificial shoulder joint: Secondary | ICD-10-CM | POA: Diagnosis not present

## 2022-03-16 DIAGNOSIS — M6281 Muscle weakness (generalized): Secondary | ICD-10-CM | POA: Diagnosis not present

## 2022-03-16 DIAGNOSIS — M25511 Pain in right shoulder: Secondary | ICD-10-CM | POA: Diagnosis not present

## 2022-03-16 DIAGNOSIS — M25611 Stiffness of right shoulder, not elsewhere classified: Secondary | ICD-10-CM | POA: Diagnosis not present

## 2022-03-27 DIAGNOSIS — M5412 Radiculopathy, cervical region: Secondary | ICD-10-CM | POA: Diagnosis not present

## 2022-03-27 DIAGNOSIS — M542 Cervicalgia: Secondary | ICD-10-CM | POA: Diagnosis not present

## 2022-03-27 DIAGNOSIS — M9931 Osseous stenosis of neural canal of cervical region: Secondary | ICD-10-CM | POA: Diagnosis not present

## 2022-03-28 DIAGNOSIS — M25511 Pain in right shoulder: Secondary | ICD-10-CM | POA: Diagnosis not present

## 2022-03-28 DIAGNOSIS — Z96611 Presence of right artificial shoulder joint: Secondary | ICD-10-CM | POA: Diagnosis not present

## 2022-03-28 DIAGNOSIS — M6281 Muscle weakness (generalized): Secondary | ICD-10-CM | POA: Diagnosis not present

## 2022-03-28 DIAGNOSIS — M25611 Stiffness of right shoulder, not elsewhere classified: Secondary | ICD-10-CM | POA: Diagnosis not present

## 2022-03-30 DIAGNOSIS — Z96611 Presence of right artificial shoulder joint: Secondary | ICD-10-CM | POA: Diagnosis not present

## 2022-03-30 DIAGNOSIS — M6281 Muscle weakness (generalized): Secondary | ICD-10-CM | POA: Diagnosis not present

## 2022-03-30 DIAGNOSIS — M25611 Stiffness of right shoulder, not elsewhere classified: Secondary | ICD-10-CM | POA: Diagnosis not present

## 2022-03-30 DIAGNOSIS — M25511 Pain in right shoulder: Secondary | ICD-10-CM | POA: Diagnosis not present

## 2022-04-04 DIAGNOSIS — Z96611 Presence of right artificial shoulder joint: Secondary | ICD-10-CM | POA: Diagnosis not present

## 2022-04-04 DIAGNOSIS — M6281 Muscle weakness (generalized): Secondary | ICD-10-CM | POA: Diagnosis not present

## 2022-04-04 DIAGNOSIS — M25611 Stiffness of right shoulder, not elsewhere classified: Secondary | ICD-10-CM | POA: Diagnosis not present

## 2022-04-04 DIAGNOSIS — M25511 Pain in right shoulder: Secondary | ICD-10-CM | POA: Diagnosis not present

## 2022-04-07 DIAGNOSIS — M25511 Pain in right shoulder: Secondary | ICD-10-CM | POA: Diagnosis not present

## 2022-04-07 DIAGNOSIS — Z96611 Presence of right artificial shoulder joint: Secondary | ICD-10-CM | POA: Diagnosis not present

## 2022-04-07 DIAGNOSIS — M25611 Stiffness of right shoulder, not elsewhere classified: Secondary | ICD-10-CM | POA: Diagnosis not present

## 2022-04-07 DIAGNOSIS — M6281 Muscle weakness (generalized): Secondary | ICD-10-CM | POA: Diagnosis not present

## 2022-04-19 DIAGNOSIS — G5602 Carpal tunnel syndrome, left upper limb: Secondary | ICD-10-CM | POA: Diagnosis not present

## 2022-04-19 DIAGNOSIS — E1165 Type 2 diabetes mellitus with hyperglycemia: Secondary | ICD-10-CM | POA: Diagnosis not present

## 2022-04-19 DIAGNOSIS — M654 Radial styloid tenosynovitis [de Quervain]: Secondary | ICD-10-CM | POA: Diagnosis not present

## 2022-04-26 ENCOUNTER — Other Ambulatory Visit: Payer: Self-pay | Admitting: Surgery

## 2022-04-28 ENCOUNTER — Encounter
Admission: RE | Admit: 2022-04-28 | Discharge: 2022-04-28 | Disposition: A | Discharge status: Home / Self Care | Payer: Medicare Other | Source: Ambulatory Visit | Attending: Surgery | Admitting: Surgery

## 2022-04-28 ENCOUNTER — Other Ambulatory Visit: Payer: Self-pay

## 2022-04-28 DIAGNOSIS — Z01812 Encounter for preprocedural laboratory examination: Secondary | ICD-10-CM

## 2022-04-28 HISTORY — DX: Angina pectoris, unspecified: I20.9

## 2022-04-28 HISTORY — DX: Cardiac murmur, unspecified: R01.1

## 2022-04-28 NOTE — Pre-Procedure Instructions (Signed)
Vivero,Kathleen M (Spouse) assisted in PAT call due to patient being [facility].

## 2022-04-28 NOTE — Patient Instructions (Addendum)
Your procedure is scheduled on:05/09/22 - TUESDAY Report to the Registration Desk on the 1st floor of the [facility]. To find out your arrival time, please call [phone] between 1PM - 3PM on: 05/08/22 - MONDAY If your arrival time is 6:00 am, do not arrive before that time as the [facility] entrance doors do not open until 6:00 am. Report to the [facility] on 05/03/22 at 8:00 am for Labs and a bag. [address] [place] [address].  REMEMBER: Instructions that are not followed completely may result in serious medical risk, up to and including death; or upon the discretion of your surgeon and anesthesiologist your surgery may need to be rescheduled.  Do not eat food after midnight the night before surgery.  No gum chewing or hard candies.  You may however, drink CLEAR liquids up to 2 hours before you are scheduled to arrive for your surgery. Do not drink anything within 2 hours of your scheduled arrival time.  Clear liquids include: - water   In addition, your doctor has ordered for you to drink the provided:  Gatorade G2 Drinking this carbohydrate drink up to two hours before surgery helps to reduce insulin resistance and improve patient outcomes. Please complete drinking 2 hours before scheduled arrival time.  One week prior to surgery:BEGINNING 05/02/22,  Stop Anti-inflammatories (NSAIDS) such as Advil, Aleve, Ibuprofen, Motrin, Naproxen, Naprosyn and Aspirin based products such as Excedrin, Goody's Powder, BC Powder.  Stop ANY OVER THE COUNTER supplements until after surgery.  You may take Tylenol if needed for pain up until the day of surgery.  Continue taking all prescribed medications with the exception of the following:  1.MetFORMIN (GLUCOPHAGE) HOLD BEGINNING 05/07/22.   TAKE ONLY THESE MEDICATIONS THE MORNING OF SURGERY WITH A SIP OF WATER:  Amlodipine 2.   Bupropion 3.   Gabapentin 4.   Pantoprazole - (take one the night before and one on  the morning of surgery - helps to prevent nausea after    surgery.) 5.  Rosuvastatin 6.  Sertraline   No Alcohol for 24 hours before or after surgery.  No Smoking including e-cigarettes for 24 hours before surgery.  No chewable tobacco products for at least 6 hours before surgery.  No nicotine patches on the day of surgery.  Do not use any "recreational" drugs for at least a week (preferably 2 weeks) before your surgery.  Please be advised that the combination of cocaine and anesthesia may have negative outcomes, up to and including death. If you test positive for cocaine, your surgery will be cancelled.  On the morning of surgery brush your teeth with toothpaste and water, you may rinse your mouth with mouthwash if you wish. Do not swallow any toothpaste or mouthwash.  Use CHG Soap or wipes as directed on instruction sheet.  Do not wear jewelry, make-up, hairpins, clips or nail polish.  Do not wear lotions, powders, or perfumes.   Do not shave body hair from the neck down 48 hours before surgery.  Contact lenses, hearing aids and dentures may not be worn into surgery.  Do not bring valuables to the hospital. [facility] is not responsible for any missing/lost belongings or valuables.   Notify your doctor if there is any change in your medical condition (cold, fever, infection).  Wear comfortable clothing (specific to your surgery type) to the hospital.  After surgery, you can help prevent lung complications by doing breathing exercises.  Take deep breaths and  cough every 1-2 hours. Your doctor may order a device called an Incentive Spirometer to help you take deep breaths. When coughing or sneezing, hold a pillow firmly against your incision with both hands. This is called "splinting." Doing this helps protect your incision. It also decreases belly discomfort.  If you are being admitted to the hospital overnight, leave your suitcase in the car. After surgery it may be brought  to your room.  In case of increased patient census, it may be necessary for you, the patient, to continue your postoperative care in the Same Day Surgery department.  If you are being discharged the day of surgery, you will not be allowed to drive home. You will need a responsible individual to drive you home and stay with you for 24 hours after surgery.   If you are taking public transportation, you will need to have a responsible individual with you.  Please call the [facility] Dept. at [phone] if you have any questions about these instructions.  Surgery Visitation Policy:  Patients undergoing a surgery or procedure may have two family members or support persons with them as long as the person is not COVID-19 positive or experiencing its symptoms.   Inpatient Visitation:    Visiting hours are 7 a.m. to 8 p.m. Up to four visitors are allowed at one time in a patient room. The visitors may rotate out with other people during the day. One designated support person (adult) may remain overnight.  Due to an increase in RSV and influenza rates and associated hospitalizations, children ages 51 and under will not be able to visit patients in [facility]. Masks continue to be strongly recommended.    Preparing for Surgery with CHLORHEXIDINE GLUCONATE (CHG) Soap  Chlorhexidine Gluconate (CHG) Soap  o An antiseptic cleaner that kills germs and bonds with the skin to continue killing germs even after washing  o Used for showering the night before surgery and morning of surgery  Before surgery, you can play an important role by reducing the number of germs on your skin.  CHG (Chlorhexidine gluconate) soap is an antiseptic cleanser which kills germs and bonds with the skin to continue killing germs even after washing.  Please do not use if you have an allergy to CHG or antibacterial soaps. If your skin becomes reddened/irritated stop using the CHG.  1. Shower the  NIGHT BEFORE SURGERY and the MORNING OF SURGERY with CHG soap.  2. If you choose to wash your hair, wash your hair first as usual with your normal shampoo.  3. After shampooing, rinse your hair and body thoroughly to remove the shampoo.  4. Use CHG as you would any other liquid soap. You can apply CHG directly to the skin and wash gently with a scrungie or a clean washcloth.  5. Apply the CHG soap to your body only from the neck down. Do not use on open wounds or open sores. Avoid contact with your eyes, ears, mouth, and genitals (private parts). Wash face and genitals (private parts) with your normal soap.  6. Wash thoroughly, paying special attention to the area where your surgery will be performed.  7. Thoroughly rinse your body with warm water.  8. Do not shower/wash with your normal soap after using and rinsing off the CHG soap.  9. Pat yourself dry with a clean towel.  10. Wear clean pajamas to bed the night before surgery.  12. Place clean sheets on your bed the night of  your first shower and do not sleep with pets.  13. Shower again with the CHG soap on the day of surgery prior to arriving at the hospital.  14. Do not apply any deodorants/lotions/powders.  15. Please wear clean clothes to the hospital. How to Use an Incentive Spirometer  An incentive spirometer is a tool that measures how well you are filling your lungs with each breath. Learning to take long, deep breaths using this tool can help you keep your lungs clear and active. This may help to reverse or lessen your chance of developing breathing (pulmonary) problems, especially infection. You may be asked to use a spirometer: After a surgery. If you have a lung problem or a history of smoking. After a long period of time when you have been unable to move or be active. If the spirometer includes an indicator to show the highest number that you have reached, your health care provider or respiratory therapist will help  you set a goal. Keep a log of your progress as told by your health care provider. What are the risks? Breathing too quickly may cause dizziness or cause you to pass out. Take your time so you do not get dizzy or light-headed. If you are in pain, you may need to take pain medicine before doing incentive spirometry. It is harder to take a deep breath if you are having pain. How to use your incentive spirometer  Sit up on the edge of your bed or on a chair. Hold the incentive spirometer so that it is in an upright position. Before you use the spirometer, breathe out normally. Place the mouthpiece in your mouth. Make sure your lips are closed tightly around it. Breathe in slowly and as deeply as you can through your mouth, causing the piston or the ball to rise toward the top of the chamber. Hold your breath for 3-5 seconds, or for as long as possible. If the spirometer includes a coach indicator, use this to guide you in breathing. Slow down your breathing if the indicator goes above the marked areas. Remove the mouthpiece from your mouth and breathe out normally. The piston or ball will return to the bottom of the chamber. Rest for a few seconds, then repeat the steps 10 or more times. Take your time and take a few normal breaths between deep breaths so that you do not get dizzy or light-headed. Do this every 1-2 hours when you are awake. If the spirometer includes a goal marker to show the highest number you have reached (best effort), use this as a goal to work toward during each repetition. After each set of 10 deep breaths, cough a few times. This will help to make sure that your lungs are clear. If you have an incision on your chest or abdomen from surgery, place a pillow or a rolled-up towel firmly against the incision when you cough. This can help to reduce pain while taking deep breaths and coughing. General tips When you are able to get out of bed: Walk around often. Continue to take  deep breaths and cough in order to clear your lungs. Keep using the incentive spirometer until your health care provider says it is okay to stop using it. If you have been in the hospital, you may be told to keep using the spirometer at home. Contact a health care provider if: You are having difficulty using the spirometer. You have trouble using the spirometer as often as instructed. Your pain medicine  is not giving enough relief for you to use the spirometer as told. You have a fever. Get help right away if: You develop shortness of breath. You develop a cough with bloody mucus from the lungs. You have fluid or blood coming from an incision site after you cough. Summary An incentive spirometer is a tool that can help you learn to take long, deep breaths to keep your lungs clear and active. You may be asked to use a spirometer after a surgery, if you have a lung problem or a history of smoking, or if you have been inactive for a long period of time. Use your incentive spirometer as instructed every 1-2 hours while you are awake. If you have an incision on your chest or abdomen, place a pillow or a rolled-up towel firmly against your incision when you cough. This will help to reduce pain. Get help right away if you have shortness of breath, you cough up bloody mucus, or blood comes from your incision when you cough. This information is not intended to replace advice given to you by your health care provider. Make sure you discuss any questions you have with your health care provider. Document Revised: 04/28/2019 Document Reviewed: 04/28/2019 Elsevier Patient Education  [place].

## 2022-05-03 ENCOUNTER — Encounter: Payer: Self-pay | Admitting: Surgery

## 2022-05-03 ENCOUNTER — Encounter
Admission: RE | Admit: 2022-05-03 | Discharge: 2022-05-03 | Disposition: A | Discharge status: Home / Self Care | Payer: Medicare Other | Source: Ambulatory Visit | Attending: Surgery | Admitting: Surgery

## 2022-05-03 DIAGNOSIS — Z01812 Encounter for preprocedural laboratory examination: Secondary | ICD-10-CM | POA: Diagnosis not present

## 2022-05-03 DIAGNOSIS — T502X5A Adverse effect of carbonic-anhydrase inhibitors, benzothiadiazides and other diuretics, initial encounter: Secondary | ICD-10-CM

## 2022-05-03 DIAGNOSIS — Z79899 Other long term (current) drug therapy: Secondary | ICD-10-CM

## 2022-05-03 LAB — BASIC METABOLIC PANEL
Anion gap: 9 (ref 5–15)
BUN: 16 mg/dL (ref 8–23)
CO2: 28 mmol/L (ref 22–32)
Calcium: 9.4 mg/dL (ref 8.9–10.3)
Chloride: 102 mmol/L (ref 98–111)
Creatinine, Ser: 0.87 mg/dL (ref 0.61–1.24)
GFR, Estimated: >60 mL/min (ref >60)
Glucose, Bld: 149 mg/dL — ABNORMAL HIGH (ref 70–99)
Potassium: 2.9 mmol/L — ABNORMAL LOW (ref 3.5–5.1)
Sodium: 139 mmol/L (ref 135–145)

## 2022-05-03 LAB — CBC
HCT: 43.6 % (ref 39.0–52.0)
Hemoglobin: 14.7 g/dL (ref 13.0–17.0)
MCH: 28.8 pg (ref 26.0–34.0)
MCHC: 33.7 g/dL (ref 30.0–36.0)
MCV: 85.3 fL (ref 80.0–100.0)
Platelets: 230 10*3/uL (ref 150–400)
RBC: 5.11 MIL/uL (ref 4.22–5.81)
RDW: 13.4 % (ref 11.5–15.5)
WBC: 7.8 10*3/uL (ref 4.0–10.5)
nRBC: 0 % (ref 0.0–0.2)

## 2022-05-03 MED ORDER — POTASSIUM CHLORIDE CRYS ER 20 MEQ PO TBCR: EXTENDED_RELEASE_TABLET | ORAL | 0 refills | Status: DC

## 2022-05-03 NOTE — Progress Notes (Signed)
[facility] Perioperative Services: Pre-Admission/Anesthesia Testing  Abnormal Lab Notification and Treatment Plan of Care   Date: 05/03/22  Name: Raymond [name] MRN:   [id number]  Re: Abnormal labs noted during PAT appointment   Notified:  Provider Name Provider Role Notification Mode  Poggi, Jenny Reichmann, MD Orthopedics (Surgeon) Routed and/or faxed via [place] Net, MD Primary Care Provider Routed and/or faxed via [place] and Notes:  ABNORMAL LAB VALUE(S): Lab Results  Component Value Date   K 2.9 (L) 05/03/2022   Raymond [name] is scheduled for an elective ENDOSCOPIC LEFT CARPAL TUNNEL RELEASE WITH RELEASE OF FIRST DORSAL COMPARTMENT LEFT WRIST (Left: Wrist) on 05/09/2022. In review of his medication reconciliation, it is noted that the patient is taking prescribed diuretic medications (triamterene-HCTZ) daily.   Please note, in efforts to promote a safe and effective anesthetic course, per current guidelines/standards set by the [facility] anesthesia team, the minimal acceptable K+ level for the patient to proceed with general anesthesia is 3.0 mmol/L. With that being said, if the patient drops any lower, his elective procedure will need to be postponed until K+ is better optimized. In efforts to prevent case cancellation, will make efforts to optimize pre-surgical K+ level so that patient can safely undergo the planned surgical intervention.   Impression and Plan:  LOVE LAPOLE found to be HYPOkalemic at 2.9 mmol/L on preoperative labs. He is on daily diuretic therapy. No supplemental potassium noted on medication list. Contacted  patient to discuss results and plans for correction of noted electrolyte derangement as follows:  Meds ordered this encounter  Medications   potassium chloride SA (KLOR-CON M) 20 MEQ tablet    Sig: Take 3 tablets (60 mEq) today, then 1 tablet (20 mEq) daily until complete. Be sure to take dose on day of surgery.  Follow up with PCP for repeat labs.    Dispense:  9 tablet    Refill:  0   Encouraged patient to follow up with PCP about 2-3 weeks postoperatively to have labs rechecked to ensure that levels are remaining within normal range. Discussed nutritional intake of K+ rich foods as an adjunctive way to keep his K+ levels normal; list of K+ rich foods verbally provided. Also mentioned ORS, however advised him not to rely solely on these drinks, as they are high in Na+, and he has a HTN diagnosis.   Will send copy of this note to surgeon and PCP to make them aware of K+ level and plans for correction. Discussed that PCP may elect to pursue a change in diuretic therapy to a K+ sparing type medication, or alternatively, they may consider adding a daily K+ supplement. Order entered to recheck K+ on the day of his surgery to ensure optimization.  Wished patient the best of luck with his upcoming surgery and subsequent recovery. He was encouraged to return call to the PAT clinic, or to his surgeon's office, should any questions or concerns arise between now and the time of his surgery.   Encounter Diagnoses  Name Primary?   Pre-operative laboratory examination Yes   Diuretic-induced hypokalemia    Long term current use of diuretic    Honor Loh, MSN, APRN, FNP-C, CEN [facility]  Peri-operative Services Nurse Practitioner Phone: [phone] 05/03/22 1:06 PM  NOTE: This note has been prepared using Dragon dictation software. Despite my best ability to proofread, there is always the potential that unintentional transcriptional errors may still  occur from this process.

## 2022-05-03 NOTE — Progress Notes (Signed)
Perioperative / Anesthesia Services  Pre-Admission Testing Clinical Review / Preoperative Anesthesia Consult  Date: 05/03/22  Patient Demographics:  Name: Raymond [name] DOB:   08/15/1955 MRN:   [id number]  Planned Surgical Procedure(s):    Case: [id number] Date/Time: 05/09/22 1425   Procedure: ENDOSCOPIC LEFT CARPAL TUNNEL RELEASE WITH RELEASE OF FIRST DORSAL COMPARTMENT LEFT WRIST (Left: Wrist)   Anesthesia type: Choice   Pre-op diagnosis:      Carpal tunnel syndrome, left G56.02     De Quervain's tenosynovitis M65.4   Location: [facility] OR ROOM 03 / [place] [facility]   Surgeons: Corky Mull, MD     NOTE: Available PAT nursing documentation and vital signs have been reviewed. Clinical nursing staff has updated patient's PMH/PSHx, current medication list, and drug allergies/intolerances to ensure comprehensive history available to assist in medical decision making as it pertains to the aforementioned surgical procedure and anticipated anesthetic course. Extensive review of available clinical information personally performed. [facility] [place] [facility] and PSHx updated with any diagnoses/procedures that  may have been inadvertently omitted during his intake with the pre-admission testing department's nursing staff.  Clinical Discussion:  Raymond [name] is a 67 y.o. male who is submitted for pre-surgical anesthesia review and clearance prior to him undergoing the above procedure. Patient has never been a smoker. Pertinent PMH includes: angina, carotid stenosis, valvular heart disease, diastolic dysfunction, CVA, PVD, HTN, HLD, T2DM, OSA (on nocturnal PAP therapy), asthma, GERD (on daily PPI therapy), OA, de Quervain's tenosynovitis, carpal tunnel syndrome, cervical spinal stenosis, depression   Patient is followed by cardiology [name], MD). He was last seen in the cardiology clinic on 02/01/2022; notes reviewed. At the time of his clinic visit, patient doing well overall from a  cardiovascular perspective. Patient denied any chest pain, shortness of breath, PND, orthopnea, palpitations, significant peripheral edema, weakness, fatigue, vertiginous symptoms, or presyncope/syncope. Patient with a past medical history significant for cardiovascular diagnoses. Documented physical exam was grossly benign, providing no evidence of acute exacerbation and/or decompensation of the patient's known cardiovascular conditions.  BILATERAL carotid artery Doppler study performed on 03/26/2019 revealed a 40-59% RICA and a 123456 contralateral LICA stenosis.   Myocardial perfusion imaging on 07/17/2019 revealed no evidence of stress-induced myocardial ischemia; LVEF 55%.     TTE on 07/17/2019 revealed normal LV systolic function; LVEF 123456.  There was mild biatrial enlargement.  Diastolic parameters consistent with G1 DD.  There was mild panvalvular regurgitation noted.   Blood pressure well controlled at 120/90 mmHg on currently prescribed CCB (amlodipine), ARB (losartan), and diuretic (triamterene/HCTZ) therapies.  Patient is on rosuvastatin for his HLD diagnosis and ASCVD prevention. T2DM reasonably controlled on currently prescribed regimen; last HgbA1c was 7.1% when checked on 02/17/2022.  Patient does have an OSAH diagnosis, however patient not fully compliant with prescribed nocturnal PAP therapy. Patient was encouraged to utilize PAP therapy as prescribed. Functional capacity, as defined by DASI, is documented as being >/= 4 METS. No changes were made to his medication regimen.  Patient to follow-up with outpatient cardiology in 1 year or sooner if needed.   Raymond [name] is scheduled for an ENDOSCOPIC LEFT CARPAL TUNNEL RELEASE WITH RELEASE OF FIRST DORSAL COMPARTMENT LEFT WRIST (Left: Wrist) on 05/09/2022 with Dr. Milagros Evener, MD. Given patient's past medical history significant for cardiovascular diagnoses, presurgical cardiac clearance was sought by the PAT team. Per cardiology, "this  patient is optimized for surgery and may proceed with the planned procedural course with a LOW risk  of significant perioperative cardiovascular complications".  In review of his medication reconciliation, it is noted the patient is on daily antiplatelet therapy.  He has been instructed on recommendations from his cardiologist for holding his daily low-dose ASA for 3 days prior to his procedure with plans to restart since postoperative bleeding respectively minimized by his primary attending surgeon.  The patient is aware that his last dose of ASA should be on 05/04/2021.   Patient denies previous perioperative complications with anesthesia in the past. In review of the available records, it is noted that patient underwent a general anesthetic course here at [facility] ([facility]) in 10/2021 without documented complications.      01/03/2022   10:21 AM 11/15/2021    4:05 PM 11/15/2021    2:58 PM  Vitals with BMI  Height '6\' 1"'$     Weight 248 lbs    BMI [id number]    Systolic 123456 123456 [id number]  Diastolic 72 76 77  Pulse 68 56 64    Providers/Specialists:   NOTE: Primary physician provider listed below. Patient may have been seen by APP or partner within same practice.   PROVIDER ROLE / SPECIALTY LAST OV  Poggi, Marshall Cork, MD Orthopedics (Surgeon) 04/19/2022  Juline Patch, MD Primary Care Provider 01/03/2022  Serafina Royals, MD Cardiology 02/01/2022  Girtha Hake, MD Physiatry 03/27/2022  Mee Hives, MD Endocrinology 08/19/2021   Allergies:  Patient has no known allergies.  Current Home Medications:   No current facility-administered medications for this encounter.    ACCU-CHEK AVIVA PLUS test strip   acetaminophen (TYLENOL) 325 MG tablet   amLODipine (NORVASC) 10 MG tablet   aspirin EC 81 MG tablet   buPROPion (WELLBUTRIN) 75 MG tablet   cholecalciferol (VITAMIN D3) 25 MCG (1000 UNIT) tablet   cyclobenzaprine (FLEXERIL) 10 MG tablet    fluticasone (FLONASE) 50 MCG/ACT nasal spray   gabapentin (NEURONTIN) 300 MG capsule   glipiZIDE (GLUCOTROL) 5 MG tablet   glucosamine-chondroitin 500-400 MG tablet   losartan (COZAAR) 100 MG tablet   metFORMIN (GLUCOPHAGE) 500 MG tablet   Omega-3 Fatty Acids (FISH OIL) 1000 MG CPDR   pantoprazole (PROTONIX) 40 MG tablet   potassium chloride SA (KLOR-CON M) 20 MEQ tablet   rosuvastatin (CRESTOR) 10 MG tablet   sertraline (ZOLOFT) 50 MG tablet   triamterene-hydrochlorothiazide (DYAZIDE) 37.5-25 MG capsule   History:   Past Medical History:  Diagnosis Date   Allergy    Anginal pain (HCC)    Arthritis    Carotid stenosis    a.) Carotid doppler 03/26/2019: 123456 RICA, 123456 LICA   Carpal tunnel syndrome, bilateral    De Quervain's tenosynovitis    Degenerative cervical spinal stenosis    Depression    Diastolic dysfunction    a.) TTE on 07/17/2019 --> EF >55%; mild panvalvular regurgitation; G1DD.   GERD (gastroesophageal reflux disease)    Heart murmur    Hyperlipidemia    Hypertension    Lumbar radiculitis    Mild intermittent asthma    OSA on CPAP    Osteoarthritis    PVD (peripheral vascular disease) (HCC)    Stroke (HCC)     no residual effects   T2DM (type 2 diabetes mellitus) ([place])    Valvular regurgitation    a.) TTE on 07/17/2019 --> trival PR; mild AR, MR, TR   Varicose vein    Past Surgical History:  Procedure Laterality Date   CARPAL TUNNEL RELEASE Right 01/02/2019  Procedure: CARPAL TUNNEL RELEASE ENDOSCOPIC;  Surgeon: Corky Mull, MD;  Location: [facility] ORS;  Service: Orthopedics;  Laterality: Right;   CHOLECYSTECTOMY  1988   CHONDROPLASTY Right 10/05/2009   knee- shaving patellofemoral joint   COLONOSCOPY  2010   COLONOSCOPY WITH PROPOFOL N/A 10/12/2021   Procedure: COLONOSCOPY WITH PROPOFOL;  Surgeon: Robert Bellow, MD;  Location: [facility] ENDOSCOPY;  Service: Endoscopy;  Laterality: N/A;   ENDOVENOUS ABLATION SAPHENOUS VEIN W/ LASER     INJECTION  KNEE Left 12/23/2019   Procedure: KNEE INJECTION;  Surgeon: Corky Mull, MD;  Location: [facility] ORS;  Service: Orthopedics;  Laterality: Left;   KNEE ARTHROSCOPY Left 07/25/2017   Procedure: Arthroscopic partial medial meniscectomy; arthroscopic debridement; and arthroscopic abrasion chondroplasty of medial femoral condyle, lateral tibial plateau, and femoral trochlea; left knee.;  Surgeon: Corky Mull, MD;  Location: [place];  Service: Orthopedics;  Laterality: Left;   KNEE ARTHROSCOPY WITH MEDIAL MENISECTOMY Right    Partial medial and lateral menisectomy   REVERSE SHOULDER ARTHROPLASTY Right 11/15/2021   Procedure: ANATOMIC  SHOULDER ARTHROPLASTY WITH BICEPS TENODESIS;  Surgeon: Corky Mull, MD;  Location: [facility] ORS;  Service: Orthopedics;  Laterality: Right;   SHOULDER ARTHROSCOPY Left 08/10/2014   Procedure: ARTHROSCOPY SHOULDER WITH LIMITED DEBRIDEMENT, REMOVAL OF LOOSE BODY AND RELEASE OF LONG END BICEPS TENDON;  Surgeon: Leanor Kail, MD;  Location: [place];  Service: Orthopedics;  Laterality: Left;   TOTAL HIP ARTHROPLASTY Left 01/25/2016   Procedure: TOTAL HIP ARTHROPLASTY;  Surgeon: Corky Mull, MD;  Location: [facility] ORS;  Service: Orthopedics;  Laterality: Left;   TOTAL KNEE ARTHROPLASTY Right 12/23/2019   Procedure: TOTAL KNEE ARTHROPLASTY;  Surgeon: Corky Mull, MD;  Location: [facility] ORS;  Service: Orthopedics;  Laterality: Right;   TOTAL KNEE ARTHROPLASTY Left 11/11/2020   Procedure: TOTAL KNEE ARTHROPLASTY;  Surgeon: Corky Mull, MD;  Location: [facility] ORS;  Service: Orthopedics;  Laterality: Left;   No family history on file. Social History   Tobacco Use   Smoking status: Never   Smokeless tobacco: Never  Vaping Use   Vaping Use: Never used  Substance Use Topics   Alcohol use: Yes    Comment: rare   Drug use: No    Pertinent Clinical Results:  LABS:   Hospital Outpatient Visit on 05/03/2022  Component Date Value Ref Range Status   WBC  05/03/2022 7.8  4.0 - 10.5 K/uL Final   RBC 05/03/2022 5.11  4.22 - 5.81 MIL/uL Final   Hemoglobin 05/03/2022 14.7  13.0 - 17.0 g/dL Final   HCT 05/03/2022 43.6  39.0 - 52.0 % Final   MCV 05/03/2022 85.3  80.0 - 100.0 fL Final   MCH 05/03/2022 28.8  26.0 - 34.0 pg Final   MCHC 05/03/2022 33.7  30.0 - 36.0 g/dL Final   RDW 05/03/2022 13.4  11.5 - 15.5 % Final   Platelets 05/03/2022 230  150 - 400 K/uL Final   nRBC 05/03/2022 0.0  0.0 - 0.2 % Final   Performed at [facility], [place], [place] [address]   Sodium 05/03/2022 139  135 - 145 mmol/L Final   Potassium 05/03/2022 2.9 (L)  3.5 - 5.1 mmol/L Final   Chloride 05/03/2022 102  98 - 111 mmol/L Final   CO2 05/03/2022 28  22 - 32 mmol/L Final   Glucose, Bld 05/03/2022 149 (H)  70 - 99 mg/dL Final   Glucose reference range applies only to samples taken  after fasting for at least 8 hours.   BUN 05/03/2022 16  8 - 23 mg/dL Final   Creatinine, Ser 05/03/2022 0.87  0.61 - 1.24 mg/dL Final   Calcium 05/03/2022 9.4  8.9 - 10.3 mg/dL Final   GFR, Estimated 05/03/2022 >60  >60 mL/min Final   Comment: (NOTE) Calculated using the CKD-EPI Creatinine Equation (2021)    Anion gap 05/03/2022 9  5 - 15 Final   Performed at [facility], [address] [place], [place] [address]    ECG: Date: 02/01/2022 Time ECG obtained: 1253 PM Rate: 72 bpm Rhythm: normal sinus Axis (leads I and aVF): Normal Intervals: PR 144 ms. QRS 84 ms. QTc 431 ms. ST segment and T wave changes: Nonspecific T wave abnormalities  Comparison: Similar to previous tracing obtained on 11/08/2021 NOTE: Tracing obtained at [facility]; unable for review. Above based on cardiologist's interpretation.    IMAGING / PROCEDURES: MRI CERVICAL WITHOUT CONTRAST performed on 08/25/2020 Mild multilevel degenerative changes in the cervical spine with mild central stenosis at the C4-C5 and C5-C6 level Moderate right C3-C4 foraminal stenosis Mild  bilateral C6-C7 foraminal stenosis Mild left foraminal stenosis C3-C4 and C5-C6 levels   MYOCARDIAL PERFUSION IMAGING STUDY (LEXISCAN) performed on 07/17/2019 LVEF 55% Regional wall motion reveals normal myocardial thickening and wall motion Overall quality of the study is good No artifacts noted Normal left ventricular cavity No evidence of stress-induced myocardial ischemia   ECHOCARDIOGRAM WITH BUBBLE STUDY done on 07/17/2019 LVEF 123456 Normal LV systolic function Normal RV systolic function Mild tricuspid, mitral, and aortic valve insufficiency No valvular stenosis Sclerotic aortic valve Mild RV enlargement Mild biatrial enlargement Negative bubble study   BILATERAL CAROTID DUPLEX done on 03/26/2019 Right Carotid: Velocities in the right ICA are consistent with a 40-59% stenosis. Left Carotid: Velocities in the left ICA are consistent with a 1-39% stenosis.  Vertebrals:  Bilateral vertebral arteries demonstrate antegrade flow.  Subclavians: Normal flow hemodynamics were seen in bilateral subclavian arteries.   Impression and Plan:  Raymond [name] has been referred for pre-anesthesia review and clearance prior to him undergoing the planned anesthetic and procedural courses. Available labs, pertinent testing, and imaging results were personally reviewed by me in preparation for upcoming operative/procedural course. [facility] medical record has been updated following extensive record review and patient interview with PAT staff.   This patient has been appropriately cleared by cardiology with an overall LOW risk of significant perioperative cardiovascular complications. Based on clinical review performed today (05/03/22), barring any significant acute changes in the patient's overall condition, it is anticipated that he will be able to proceed with the planned surgical intervention. Any acute changes in clinical condition may necessitate his procedure being postponed and/or  cancelled. Patient will meet with anesthesia team (MD and/or CRNA) on the day of his procedure for preoperative evaluation/assessment. Questions regarding anesthetic course will be fielded at that time.   Pre-surgical instructions were reviewed with the patient during his PAT appointment, and questions were fielded to satisfaction by PAT clinical staff. He has been instructed on which medications that he will need to hold prior to surgery, as well as the ones that have been deemed safe/appropriate to take of the day of his procedure. As part of the general education provided by PAT, patient made aware both verbally and in writing, that he would need to abstain from the use of any illegal substances during his perioperative course.  He was advised that failure to follow the provided instructions  could necessitate case cancellation or result serious perioperative complications up to and including death. Patient encouraged to contact PAT and/or his surgeon's office to discuss any questions or concerns that may arise prior to surgery; verbalized understanding.   Honor Loh, MSN, APRN, FNP-C, CEN [facility]  Peri-operative Services Nurse Practitioner Phone: [phone] Fax: [phone] 05/03/22 1:11 PM  NOTE: This note has been prepared using Dragon dictation software. Despite my best ability to proofread, there is always the potential that unintentional transcriptional errors may still occur from this process.

## 2022-05-09 ENCOUNTER — Ambulatory Visit: Payer: Medicare Other | Admitting: Urgent Care

## 2022-05-09 ENCOUNTER — Encounter: Payer: Self-pay | Admitting: Surgery

## 2022-05-09 ENCOUNTER — Encounter
Admission: RE | Disposition: A | Discharge status: Home / Self Care | Payer: Self-pay | Source: Home / Self Care | Attending: Surgery

## 2022-05-09 ENCOUNTER — Ambulatory Visit
Admission: RE | Admit: 2022-05-09 | Discharge: 2022-05-09 | Disposition: A | Discharge status: Home / Self Care | Payer: Medicare Other | Attending: Surgery | Admitting: Surgery

## 2022-05-09 ENCOUNTER — Other Ambulatory Visit: Payer: Self-pay

## 2022-05-09 DIAGNOSIS — Z09 Encounter for follow-up examination after completed treatment for conditions other than malignant neoplasm: Secondary | ICD-10-CM | POA: Insufficient documentation

## 2022-05-09 DIAGNOSIS — M654 Radial styloid tenosynovitis [de Quervain]: Secondary | ICD-10-CM | POA: Insufficient documentation

## 2022-05-09 DIAGNOSIS — Z7984 Long term (current) use of oral hypoglycemic drugs: Secondary | ICD-10-CM | POA: Diagnosis not present

## 2022-05-09 DIAGNOSIS — F32A Depression, unspecified: Secondary | ICD-10-CM | POA: Diagnosis not present

## 2022-05-09 DIAGNOSIS — K219 Gastro-esophageal reflux disease without esophagitis: Secondary | ICD-10-CM | POA: Insufficient documentation

## 2022-05-09 DIAGNOSIS — Z79899 Other long term (current) drug therapy: Secondary | ICD-10-CM | POA: Insufficient documentation

## 2022-05-09 DIAGNOSIS — E1151 Type 2 diabetes mellitus with diabetic peripheral angiopathy without gangrene: Secondary | ICD-10-CM | POA: Insufficient documentation

## 2022-05-09 DIAGNOSIS — I10 Essential (primary) hypertension: Secondary | ICD-10-CM | POA: Diagnosis not present

## 2022-05-09 DIAGNOSIS — Z8673 Personal history of transient ischemic attack (TIA), and cerebral infarction without residual deficits: Secondary | ICD-10-CM | POA: Diagnosis not present

## 2022-05-09 DIAGNOSIS — E782 Mixed hyperlipidemia: Secondary | ICD-10-CM | POA: Diagnosis not present

## 2022-05-09 DIAGNOSIS — Z01812 Encounter for preprocedural laboratory examination: Secondary | ICD-10-CM

## 2022-05-09 DIAGNOSIS — E1165 Type 2 diabetes mellitus with hyperglycemia: Secondary | ICD-10-CM | POA: Diagnosis not present

## 2022-05-09 DIAGNOSIS — G5602 Carpal tunnel syndrome, left upper limb: Secondary | ICD-10-CM | POA: Insufficient documentation

## 2022-05-09 DIAGNOSIS — T502X5A Adverse effect of carbonic-anhydrase inhibitors, benzothiadiazides and other diuretics, initial encounter: Secondary | ICD-10-CM

## 2022-05-09 HISTORY — PX: CARPAL TUNNEL RELEASE: SHX101

## 2022-05-09 HISTORY — DX: Radial styloid tenosynovitis (de quervain): M65.4

## 2022-05-09 LAB — GLUCOSE, CAPILLARY: Glucose-Capillary: 142 mg/dL — ABNORMAL HIGH (ref 70–99)

## 2022-05-09 LAB — POCT I-STAT, CHEM 8
BUN: 22 mg/dL (ref 8–23)
Calcium, Ion: 1.24 mmol/L (ref 1.15–1.40)
Chloride: 101 mmol/L (ref 98–111)
Creatinine, Ser: 0.9 mg/dL (ref 0.61–1.24)
Glucose, Bld: 173 mg/dL — ABNORMAL HIGH (ref 70–99)
HCT: 46 % (ref 39.0–52.0)
Hemoglobin: 15.6 g/dL (ref 13.0–17.0)
Potassium: 4.3 mmol/L (ref 3.5–5.1)
Sodium: 140 mmol/L (ref 135–145)
TCO2: 28 mmol/L (ref 22–32)

## 2022-05-09 SURGERY — RELEASE, CARPAL TUNNEL, ENDOSCOPIC
Anesthesia: General | Site: Wrist | Laterality: Left

## 2022-05-09 MED ORDER — CEFAZOLIN SODIUM-DEXTROSE 2-4 GM/100ML-% IV SOLN
INTRAVENOUS | Status: AC
  Filled 2022-05-09: qty 100

## 2022-05-09 MED ORDER — ONDANSETRON HCL 4 MG/2ML IJ SOLN: 4.0000 mg | Freq: Four times a day (QID) | INTRAMUSCULAR | Status: DC | PRN

## 2022-05-09 MED ORDER — OXYCODONE HCL 5 MG/5ML PO SOLN: 5.0000 mg | Freq: Once | ORAL | Status: AC | PRN

## 2022-05-09 MED ORDER — FENTANYL CITRATE (PF) 100 MCG/2ML IJ SOLN: 25.0000 ug | INTRAMUSCULAR | Status: DC | PRN

## 2022-05-09 MED ORDER — CHLORHEXIDINE GLUCONATE 0.12 % MT SOLN
OROMUCOSAL | Status: AC
  Filled 2022-05-09: qty 15

## 2022-05-09 MED ORDER — METOCLOPRAMIDE HCL 10 MG PO TABS: 5.0000 mg | ORAL_TABLET | Freq: Three times a day (TID) | ORAL | Status: DC | PRN

## 2022-05-09 MED ORDER — HYDROCODONE-ACETAMINOPHEN 5-325 MG PO TABS: 1.0000 | ORAL_TABLET | Freq: Four times a day (QID) | ORAL | 0 refills | Status: AC | PRN

## 2022-05-09 MED ORDER — ONDANSETRON HCL 4 MG/2ML IJ SOLN
INTRAMUSCULAR | Status: DC | PRN
  Administered 2022-05-09: 4 mg via INTRAVENOUS

## 2022-05-09 MED ORDER — PROPOFOL 10 MG/ML IV BOLUS
INTRAVENOUS | Status: DC | PRN
  Administered 2022-05-09: 200 mg via INTRAVENOUS

## 2022-05-09 MED ORDER — MIDAZOLAM HCL 2 MG/2ML IJ SOLN
INTRAMUSCULAR | Status: AC
  Filled 2022-05-09: qty 2

## 2022-05-09 MED ORDER — METOCLOPRAMIDE HCL 5 MG/ML IJ SOLN: 5.0000 mg | Freq: Three times a day (TID) | INTRAMUSCULAR | Status: DC | PRN

## 2022-05-09 MED ORDER — 0.9 % SODIUM CHLORIDE (POUR BTL) OPTIME
TOPICAL | Status: DC | PRN
  Administered 2022-05-09: 150 mL

## 2022-05-09 MED ORDER — SODIUM CHLORIDE 0.9 % IV SOLN: INTRAVENOUS | Status: DC

## 2022-05-09 MED ORDER — OXYCODONE HCL 5 MG PO TABS
ORAL_TABLET | ORAL | Status: AC
  Filled 2022-05-09: qty 1

## 2022-05-09 MED ORDER — ACETAMINOPHEN 10 MG/ML IV SOLN
INTRAVENOUS | Status: AC
  Filled 2022-05-09: qty 100

## 2022-05-09 MED ORDER — HYDROCODONE-ACETAMINOPHEN 5-325 MG PO TABS: 1.0000 | ORAL_TABLET | ORAL | Status: DC | PRN

## 2022-05-09 MED ORDER — CHLORHEXIDINE GLUCONATE 0.12 % MT SOLN
15.0000 mL | Freq: Once | OROMUCOSAL | Status: AC
  Administered 2022-05-09: 15 mL via OROMUCOSAL

## 2022-05-09 MED ORDER — ONDANSETRON HCL 4 MG/2ML IJ SOLN: 4.0000 mg | Freq: Once | INTRAMUSCULAR | Status: DC | PRN

## 2022-05-09 MED ORDER — DEXAMETHASONE SODIUM PHOSPHATE 10 MG/ML IJ SOLN
INTRAMUSCULAR | Status: DC | PRN
  Administered 2022-05-09: 5 mg via INTRAVENOUS

## 2022-05-09 MED ORDER — FENTANYL CITRATE (PF) 100 MCG/2ML IJ SOLN
INTRAMUSCULAR | Status: DC | PRN
  Administered 2022-05-09: 25 ug via INTRAVENOUS

## 2022-05-09 MED ORDER — PROPOFOL 10 MG/ML IV BOLUS
INTRAVENOUS | Status: AC
  Filled 2022-05-09: qty 20

## 2022-05-09 MED ORDER — ACETAMINOPHEN 10 MG/ML IV SOLN: 1000.0000 mg | Freq: Once | INTRAVENOUS | Status: DC | PRN

## 2022-05-09 MED ORDER — KETOROLAC TROMETHAMINE 30 MG/ML IJ SOLN
INTRAMUSCULAR | Status: DC | PRN
  Administered 2022-05-09: 30 mg via INTRAVENOUS

## 2022-05-09 MED ORDER — LIDOCAINE HCL (CARDIAC) PF 100 MG/5ML IV SOSY
PREFILLED_SYRINGE | INTRAVENOUS | Status: DC | PRN
  Administered 2022-05-09: 100 mg via INTRAVENOUS

## 2022-05-09 MED ORDER — EPHEDRINE SULFATE (PRESSORS) 50 MG/ML IJ SOLN
INTRAMUSCULAR | Status: DC | PRN
  Administered 2022-05-09 (×2): 5 mg via INTRAVENOUS
  Administered 2022-05-09: 10 mg via INTRAVENOUS
  Administered 2022-05-09 (×4): 5 mg via INTRAVENOUS

## 2022-05-09 MED ORDER — GLYCOPYRROLATE 0.2 MG/ML IJ SOLN
INTRAMUSCULAR | Status: DC | PRN
  Administered 2022-05-09 (×2): .1 mg via INTRAVENOUS

## 2022-05-09 MED ORDER — MIDAZOLAM HCL 2 MG/2ML IJ SOLN
INTRAMUSCULAR | Status: DC | PRN
  Administered 2022-05-09: 2 mg via INTRAVENOUS

## 2022-05-09 MED ORDER — BUPIVACAINE HCL (PF) 0.5 % IJ SOLN
INTRAMUSCULAR | Status: AC
  Filled 2022-05-09: qty 30

## 2022-05-09 MED ORDER — ONDANSETRON HCL 4 MG PO TABS: 4.0000 mg | ORAL_TABLET | Freq: Four times a day (QID) | ORAL | Status: DC | PRN

## 2022-05-09 MED ORDER — BUPIVACAINE HCL (PF) 0.5 % IJ SOLN
INTRAMUSCULAR | Status: DC | PRN
  Administered 2022-05-09: 15 mL

## 2022-05-09 MED ORDER — ORAL CARE MOUTH RINSE: 15.0000 mL | Freq: Once | OROMUCOSAL | Status: AC

## 2022-05-09 MED ORDER — OXYCODONE HCL 5 MG PO TABS
5.0000 mg | ORAL_TABLET | Freq: Once | ORAL | Status: AC | PRN
  Administered 2022-05-09: 5 mg via ORAL

## 2022-05-09 MED ORDER — FENTANYL CITRATE (PF) 100 MCG/2ML IJ SOLN
INTRAMUSCULAR | Status: AC
  Filled 2022-05-09: qty 2

## 2022-05-09 MED ORDER — EPHEDRINE 5 MG/ML INJ
INTRAVENOUS | Status: AC
  Filled 2022-05-09: qty 10

## 2022-05-09 MED ORDER — ACETAMINOPHEN 10 MG/ML IV SOLN
INTRAVENOUS | Status: DC | PRN
  Administered 2022-05-09: 1000 mg via INTRAVENOUS

## 2022-05-09 MED ORDER — CEFAZOLIN SODIUM-DEXTROSE 2-4 GM/100ML-% IV SOLN
2.0000 g | INTRAVENOUS | Status: AC
  Administered 2022-05-09: 2 g via INTRAVENOUS

## 2022-05-09 SURGICAL SUPPLY — 28 items
APL PRP STRL LF DISP 70% ISPRP (MISCELLANEOUS) ×1
BNDG CMPR 5X4 CHSV STRCH STRL (GAUZE/BANDAGES/DRESSINGS) ×1
BNDG COHESIVE 4X5 TAN STRL LF (GAUZE/BANDAGES/DRESSINGS) ×1 IMPLANT
BNDG ELASTIC 2X5.8 VLCR STR LF (GAUZE/BANDAGES/DRESSINGS) ×1 IMPLANT
BNDG ESMARCH 4 X 12 STRL LF (GAUZE/BANDAGES/DRESSINGS) ×1
BNDG ESMARCH 4X12 STRL LF (GAUZE/BANDAGES/DRESSINGS) ×1 IMPLANT
CHLORAPREP W/TINT 26 (MISCELLANEOUS) ×1 IMPLANT
CORD BIP STRL DISP 12FT (MISCELLANEOUS) ×1 IMPLANT
DRAPE SURG 17X11 SM STRL (DRAPES) ×1 IMPLANT
FORCEPS JEWEL BIP 4-3/4 STR (INSTRUMENTS) ×1 IMPLANT
GAUZE SPONGE 4X4 12PLY STRL (GAUZE/BANDAGES/DRESSINGS) ×1 IMPLANT
GAUZE XEROFORM 1X8 LF (GAUZE/BANDAGES/DRESSINGS) ×1 IMPLANT
GLOVE BIO SURGEON STRL SZ8 (GLOVE) ×1 IMPLANT
GLOVE INDICATOR 8.0 STRL GRN (GLOVE) ×1 IMPLANT
GOWN STRL REUS W/ TWL LRG LVL3 (GOWN DISPOSABLE) ×1 IMPLANT
GOWN STRL REUS W/ TWL XL LVL3 (GOWN DISPOSABLE) ×1 IMPLANT
GOWN STRL REUS W/TWL LRG LVL3 (GOWN DISPOSABLE) ×1
GOWN STRL REUS W/TWL XL LVL3 (GOWN DISPOSABLE) ×1
KIT CARPAL TUNNEL (MISCELLANEOUS) ×1
KIT ESCP INSRT D SLOT CANN KN (MISCELLANEOUS) ×1 IMPLANT
KIT TURNOVER KIT A (KITS) ×1 IMPLANT
MANIFOLD NEPTUNE II (INSTRUMENTS) ×1 IMPLANT
NS IRRIG 500ML POUR BTL (IV SOLUTION) ×1 IMPLANT
PACK EXTREMITY ARMC (MISCELLANEOUS) ×1 IMPLANT
SPLINT WRIST XL LT TX990310 (SOFTGOODS) IMPLANT
STOCKINETTE IMPERVIOUS 9X36 MD (GAUZE/BANDAGES/DRESSINGS) ×1 IMPLANT
SUT PROLENE 4 0 PS 2 18 (SUTURE) ×1 IMPLANT
WATER STERILE IRR 500ML POUR (IV SOLUTION) ×1 IMPLANT

## 2022-05-09 NOTE — Discharge Instructions (Addendum)
AMBULATORY SURGERY  DISCHARGE INSTRUCTIONS   The drugs that you were given will stay in your system until tomorrow so for the next 24 hours you should not:  Drive an automobile Make any legal decisions Drink any alcoholic beverage   You may resume regular meals tomorrow.  Today it is better to start with liquids and gradually work up to solid foods.  You may eat anything you prefer, but it is better to start with liquids, then soup and crackers, and gradually work up to solid foods.   Please notify your doctor immediately if you have any unusual bleeding, trouble breathing, redness and pain at the surgery site, drainage, fever, or pain not relieved by medication.    Additional Instructions:   Orthopedic discharge instructions: Keep dressing dry and intact. Keep hand elevated above heart level. May shower after dressing removed on postop day 4 (Saturday). Cover sutures with Band-Aids after drying off, then reapply Velcro splint. Apply ice to affected area frequently. Take Aleve 2 tabs BID with meals for 5-7 days, then as necessary. Take ES Tylenol or pain medication as prescribed when needed.  Return for follow-up in 10-14 days or as scheduled.       Please contact your physician with any problems or Same Day Surgery at [phone], Monday through Friday 6 am to 4 pm, or [facility] number at [phone].

## 2022-05-09 NOTE — Anesthesia Postprocedure Evaluation (Signed)
Anesthesia Post Note  Patient: Raymond [name]  Procedure(s) Performed: ENDOSCOPIC LEFT CARPAL TUNNEL RELEASE WITH RELEASE OF FIRST DORSAL COMPARTMENT LEFT WRIST (Left: Wrist)  Patient location during evaluation: PACU Anesthesia Type: General Level of consciousness: awake and alert Pain management: pain level controlled Vital Signs Assessment: post-procedure vital signs reviewed and stable Respiratory status: spontaneous breathing, nonlabored ventilation, respiratory function stable and patient connected to nasal cannula oxygen Cardiovascular status: blood pressure returned to baseline and stable Postop Assessment: no apparent nausea or vomiting Anesthetic complications: no   No notable events documented.   Last Vitals:  Vitals:   05/09/22 1130 05/09/22 1138  BP: 120/69 112/71  Pulse: 65 70  Resp: 15 17  Temp: (!) 36.2 C (!) 36.1 C  SpO2: 99% 100%    Last Pain:  Vitals:   05/09/22 1138  TempSrc: Temporal  PainSc: 3                  Arita Miss

## 2022-05-09 NOTE — Anesthesia Procedure Notes (Signed)
Procedure Name: LMA Insertion Date/Time: 05/09/2022 10:07 AM  Performed by: Aline Brochure, CRNAPre-anesthesia Checklist: Patient identified, Patient being monitored, Timeout performed, Emergency Drugs available and Suction available Patient Re-evaluated:Patient Re-evaluated prior to induction Oxygen Delivery Method: Circle system utilized Preoxygenation: Pre-oxygenation with 100% oxygen Induction Type: IV induction Ventilation: Mask ventilation without difficulty LMA: LMA inserted LMA Size: 5.0 Tube type: Oral Number of attempts: 1 Placement Confirmation: positive ETCO2 and breath sounds checked- equal and bilateral Tube secured with: Tape Dental Injury: Teeth and Oropharynx as per pre-operative assessment

## 2022-05-09 NOTE — Transfer of Care (Signed)
Immediate Anesthesia Transfer of Care Note  Patient: Raymond [name]  Procedure(s) Performed: ENDOSCOPIC LEFT CARPAL TUNNEL RELEASE WITH RELEASE OF FIRST DORSAL COMPARTMENT LEFT WRIST (Left: Wrist)  Patient Location: PACU  Anesthesia Type:General  Level of Consciousness: drowsy  Airway & Oxygen Therapy: Patient Spontanous Breathing and Patient connected to face mask oxygen  Post-op Assessment: Report given to RN and Post -op Vital signs reviewed and stable  Post vital signs: Reviewed and stable  Last Vitals:  Vitals Value Taken Time  BP 140/70 05/09/22 1106  Temp    Pulse 56 05/09/22 1111  Resp 12 05/09/22 1111  SpO2 100 % 05/09/22 1111  Vitals shown include unvalidated device data.  Last Pain:  Vitals:   05/09/22 0903  TempSrc: Temporal  PainSc: 0-No pain         Complications: No notable events documented.

## 2022-05-09 NOTE — Op Note (Signed)
05/09/2022  11:05 AM  Patient:   Raymond [name]  Pre-Op Diagnosis:   1. Left carpal tunnel syndrome.  2. De Quervain's tenosynovitis left wrist.  Post-Op Diagnosis:   Same.  Procedure:   1. Endoscopic left carpal tunnel release.  2. Release of left first dorsal compartment.  Surgeon:   Pascal Lux, MD  Anesthesia:   General LMA  Findings:   As above.  Complications:   None  EBL:   0 cc  Fluids:   700 cc crystalloid  TT:   28 minutes at 250 mmHg  Drains:   None  Closure:   4-0 Prolene interrupted sutures  Brief Clinical Note:   The patient is a 67 year old male with a history of progressively worsening pain and paresthesias to his left hand. His symptoms have persisted despite medications, activity modification, splinting, etc. His history and examination are consistent with carpal tunnel syndrome. He presents at this time for an endoscopic left carpal tunnel release.  The patient also complains of persistent radial sided right left wrist pain. His symptoms have persisted despite medications, activity modification, etc. His history and examination are consistent with De Quervain's tenosynovitis. He presents at this time for a left first dorsal compartment release  Procedure:   The patient was brought into the operating room and lain in the supine position. After adequate general laryngeal mask anesthesia was obtained, the left hand and upper extremity were prepped with ChloraPrep solution before being draped sterilely. Preoperative antibiotics were administered. A timeout was performed to verify the appropriate surgical site before the limb was exsanguinated with an Esmarch and the tourniquet inflated to 250 mmHg.   First the carpal tunnel was addressed. An approximately 1.5-2 cm incision was made over the volar wrist flexion crease, centered over the palmaris longus tendon. The incision was carried down through the subcutaneous tissues with care taken to identify and protect  any neurovascular structures. The distal forearm fascia was penetrated just proximal to the transverse carpal ligament. The soft tissues were released off the superficial and deep surfaces of the distal forearm fascia and this was released proximally for 3-4 cm under direct visualization.  Attention was directed distally. The Soil scientist was passed beneath the transverse carpal ligament along the ulnar aspect of the carpal tunnel and used to release any adhesions as well as to remove any adherent synovial tissue before first the smaller then the larger of the two dilators were passed beneath the transverse carpal ligament along the ulnar margin of the carpal tunnel. The slotted cannula was introduced and the endoscope was placed into the slotted cannula and the undersurface of the transverse carpal ligament visualized. The distal margin of the transverse carpal ligament was marked by placing a 25-gauge needle percutaneously at [place] cardinal point so that it entered the distal portion of the slotted cannula. Under endoscopic visualization, the transverse carpal ligament was released from proximal to distal using the end-cutting blade. A second pass was performed to ensure complete release of the ligament. The adequacy of release was verified both endoscopically and by palpation using the freer elevator.  Next, the first dorsal compartment was addressed. A 1.5-2 cm incision was made transversely over the first dorsal compartment. The incision was carried down through subcutaneous tissues with care taken to identify and protect the sensory nerves and veins running in this area. The mass was identified and circumferentially dissected out before being removed. The underlying retinaculum was identified. The first dorsal compartment was released from proximal  to distal using Metzenbaum scissors. The underlying tendons were carefully inspected and found to be intact. No additional adhesions were  identified.  Each wound was irrigated thoroughly with sterile saline solution before being closed using 4-0 Prolene interrupted sutures. A total of 15 cc of 0.5% plain Sensorcaine was injected in and around the incision before a sterile bulky dressing was applied to the wound. The patient was placed into a volar wrist splint before being awakened, extubated, and returned to the recovery room in satisfactory condition after tolerating the procedure well.

## 2022-05-09 NOTE — Anesthesia Preprocedure Evaluation (Signed)
Anesthesia Evaluation  Patient identified by MRN, date of birth, ID band Patient awake    Reviewed: Allergy & Precautions, NPO status , Patient's Chart, lab work & pertinent test results  History of Anesthesia Complications Negative for: history of anesthetic complications  Airway Mallampati: IV  TM Distance: >3 FB Neck ROM: Full    Dental  (+) Poor Dentition, Dental Advidsory Given   Pulmonary neg shortness of breath, asthma , sleep apnea and Continuous Positive Airway Pressure Ventilation , neg recent URI   breath sounds clear to auscultation- rhonchi (-) wheezing      Cardiovascular hypertension, Pt. on medications (-) angina + Peripheral Vascular Disease  (-) CAD, (-) Past MI, (-) Cardiac Stents and (-) CABG  Rhythm:Regular Rate:Normal - Systolic murmurs and - Diastolic murmurs    Neuro/Psych neg Seizures PSYCHIATRIC DISORDERS  Depression     Neuromuscular disease CVA, No Residual Symptoms    GI/Hepatic Neg liver ROS,GERD  ,,  Endo/Other  diabetes, Oral Hypoglycemic Agents    Renal/GU negative Renal ROS     Musculoskeletal  (+) Arthritis ,    Abdominal  (+) + obese  Peds  Hematology negative hematology ROS (+)   Anesthesia Other Findings Past Medical History: No date: Allergy No date: Arthritis No date: Asthma     Comment:  mild No date: Carotid stenosis No date: Carpal tunnel syndrome     Comment:  bilateral No date: Depression No date: Diabetes mellitus without complication (HCC)     Comment:  Type II No date: GERD (gastroesophageal reflux disease) No date: Hyperlipidemia No date: Hypertension     Comment:  neg stress test 10 yrs ago - Dr Humphrey Rolls No date: PVD (peripheral vascular disease) ([place]) No date: Sleep apnea     Comment:  has BiPAP -doesn't use No date: Stroke [facility])     Comment:   no residual effects No date: Varicose vein     Comment:  treated by Dr. Delana Meyer   Reproductive/Obstetrics                               Lab Results  Component Value Date   WBC 7.8 05/03/2022   HGB 15.6 05/09/2022   HCT 46.0 05/09/2022   MCV 85.3 05/03/2022   PLT 230 05/03/2022    Anesthesia Physical Anesthesia Plan  ASA: 3  Anesthesia Plan: General   Post-op Pain Management: Minimal or no pain anticipated, Ofirmev IV (intra-op)* and Toradol IV (intra-op)*   Induction: Intravenous  PONV Risk Score and Plan: 2 and Ondansetron, Midazolam and Dexamethasone  Airway Management Planned: LMA  Additional Equipment: None  Intra-op Plan:   Post-operative Plan: Extubation in OR  Informed Consent: I have reviewed the patients History and Physical, chart, labs and discussed the procedure including the risks, benefits and alternatives for the proposed anesthesia with the patient or authorized representative who has indicated his/her understanding and acceptance.     Dental advisory given  Plan Discussed with: CRNA and Surgeon  Anesthesia Plan Comments: (Discussed risks of anesthesia with patient, including PONV, sore throat, lip/dental/eye damage. Rare risks discussed as well, such as cardiorespiratory and neurological sequelae, and allergic reactions. Discussed the role of CRNA in patient's perioperative care. Patient understands.)         Anesthesia Quick Evaluation

## 2022-05-09 NOTE — H&P (Signed)
History of Present Illness:  Raymond [name] is a 67 y.o. male who presents for evaluation and treatment of his radial sided left wrist pain which radiates into the radial aspect of his left thumb. The symptoms have been present for several months and developed without any specific cause or injury. He mentioned the symptoms to his physical therapist who suggested that he may have some inflammation of the tendons that run along the radial side of his wrist and advised him to obtain a thumb spica splint which he absolutely obtained and has been wearing. He does find it to be of benefit. He has not been taking any medication specifically for these symptoms, but has been trying to minimize the use of his left hand because of these symptoms.  The patient also complains of a longstanding history of gradually worsening pain and paresthesias to his left hand. His symptoms are aggravated at night, frequently awakening him from sleep. In addition, he notes increased numbness and paresthesias with repetitive activities using the left hand, as well as with driving. He finds that he frequently has to take his hand off the wheel and "shake it out". He denies any specific injury to his hand. He has been evaluated in the past by EMG and was told that he had moderate carpal tunnel syndrome to his left hand. He had undergone an endoscopic right carpal tunnel release nearly 4 years ago from which he has done quite well.  Current Outpatient Medications: amLODIPine (NORVASC) 10 MG tablet Take 1 tablet (10 mg total) by mouth once daily  amoxicillin (AMOXIL) 500 MG capsule TAKE 4 CAPSULES BY MOUTH 1 HOUR PRIOR TO DENTAL TREATMENT  aspirin 81 MG EC tablet Take 1 tablet (81 mg total) by mouth once daily  buPROPion (WELLBUTRIN) 75 MG tablet Take 1 tablet (75 mg total) by mouth 2 (two) times daily  cyclobenzaprine (FLEXERIL) 10 MG tablet TAKE 1 TABLET BY MOUTH TWICE A DAY AS NEEDED FOR MUSCLE SPASM 60 tablet 11  glipiZIDE  (GLUCOTROL) 5 MG tablet TAKE 2 TABLETS (5-10 MG TOTAL) BY MOUTH 2 (TWO) TIMES DAILY BEFORE A MEAL. 360 tablet 3  losartan (COZAAR) 100 MG tablet TAKE 1 TABLET BY MOUTH EVERY DAY 90 tablet 3  meclizine (ANTIVERT) 25 mg tablet TAKE 1/2 TO 1 TABLET BY MOUTH EVERY 8 HOURS AS NEEDED FOR DIZZINESS  metFORMIN (GLUCOPHAGE) 500 MG tablet Take two 500 mg tablets (1000 mg total) by mouth twice daily, with meals. 120 tablet 5  pantoprazole (PROTONIX) 40 MG DR tablet Take 1 tablet (40 mg total) by mouth once daily  polyethylene glycol (MIRALAX) powder One bottle for colonoscopy prep. Use as directed. 255 g 0  promethazine (PHENERGAN) 25 MG tablet TAKE 1 TABLET BY MOUTH EVERY 8 HOURS AS NEEDED FOR NAUSEA  rosuvastatin (CRESTOR) 10 MG tablet Take 1 tablet (10 mg total) by mouth once daily  sertraline (ZOLOFT) 50 MG tablet Take 1 tablet (50 mg total) by mouth once daily  tiZANidine (ZANAFLEX) 2 MG tablet TAKE 1 TABLET (2 MG TOTAL) BY MOUTH NIGHTLY AS NEEDED 30 tablet 1  triamterene-hydrochlorothiazide (DYAZIDE) 37.5-25 mg capsule Take 1 capsule by mouth once daily  gabapentin (NEURONTIN) 300 MG capsule Take 3 capsules (900 mg total) by mouth 3 (three) times daily 810 capsule 3   Allergies: No Known Allergies  Past Medical History:  Arthrosis of knee 08/14/2013  Benign essential HTN 07/26/2016  Biatrial enlargement  Bicipital tendinitis of left shoulder 07/21/2014  Carotid stenosis  Carpal tunnel syndrome,  right 08/09/2018  Chest pain 07/24/2016  Chickenpox  Complex tear of medial meniscus of left knee as current injury 07/26/2017  Degenerative cervical spinal stenosis  Depression 07/21/2015  Diabetes mellitus type 2, uncomplicated (CMS-HCC)  GERD (gastroesophageal reflux disease) 123456  Grade I diastolic dysfunction [id number]  a.) TTE on 07/17/2019 --> EF >55%; mild panvalvular regurgitation; G1DD.  Hyperlipidemia, mixed 07/26/2016  Hypertension 07/21/2015  Impingement syndrome of left  shoulder 07/21/2014  Lumbar radiculitis 08/14/2013  Mild intermittent asthma  Neuritis or radiculitis due to rupture of lumbar intervertebral disc 08/14/2013  OSA on CPAP  Primary osteoarthritis of left hip 11/03/2015  Primary osteoarthritis of left shoulder 08/09/2018  Primary osteoarthritis of right knee 01/24/2017  PVD (peripheral vascular disease) (CMS-HCC)  Rotator cuff tendinitis, left 08/09/2018  Sleep apnea  Stable angina pectoris 07/26/2016  Status post arthroscopy of left knee 08/03/2017  Status post total hip replacement, left 01/26/2016  Stroke (CMS-HCC)  Uncontrolled type 2 diabetes mellitus without complication, without long-term current use of insulin 09/12/2016  Valvular regurgitation 07/17/2019  a.) TTE on 07/17/2019 --> trival PR; mild AR, MR, TR  Varicose veins of leg with pain, left 12/30/2018   Past Surgical History:  Procedure Laterality Date  CHOLECYSTECTOMY 1988 (Dr Bary Castilla)  COLONOSCOPY 04/16/2008 (Dr. Allen Norris)  KNEE ARTHROSCOPY Right 10/05/2009  DIAGNOSTIC AND OPERATIVE ARTHROSCOPY WITH PARTIAL MEDIAL AND LATERAL MENISECTOMY, DR.CALIFF---  KNEE CHONDROPLASTIC SHAVING PATELLOFEMORAL JOINT 10/05/2009  CHONDROPLASTY Right 10/05/2009  knee- shaving patellofemoral joint  Arthroscopic debridement of chondral lesions left shoulder along with arthroscopic removal of loose body and arthroscopic release of the long head of the biceps tendon Left 08/10/2014 (Dr.kernodle)  left shoulder Left 08/10/2014  JOINT REPLACEMENT Left 01/25/2016  Total hip  Arthroscopic partial medial menscectomy, arthroscopic debridement, and arthroscopic abrasion chondroplasty of medial femoral condyle, lateral tibial plateau, and femoral trochlea,left knee Left 07/25/2017 (Dr.Naydeline Morace)  CARPAL TUNNEL RELEASE 12/22/2018  Endoscopic right carpal tunnel release. Right 01/02/2019 (Dr.Jabori Henegar)  1. Right TKA using all-cemented [employer] system with a 75 mm PCR femur, a 79 mm tibial tray with a 10  mm anterior stabilized E-poly insert, and a 37 x 10 mm all-poly 3-pegged domed patella. Right 12/23/2019 (Dr. Roland Rack)  Left TKA using all-cemented [employer] system with a 75 mm PCR femur, a 79 mm tibial tray with a 12 mm anterior stabilized E-poly insert, and a 37 x 8.6 mm all-poly 3-pegged domed patella Left 11/11/2020 (Dr. Roland Rack)  Anatomic right total shoulder arthroplasty with biceps tenodesis Right 11/15/2021 (Dr. Roland Rack)  ENDOVENOUS ABLATION SAPHENOUS VEIN W/ LASER   Family History:  Myocardial Infarction (Heart attack) Mother  High blood pressure (Hypertension) Mother  Clotting disorder Mother  Coronary Artery Disease (Blocked arteries around heart) Mother  Dementia Mother  Stroke Mother  High blood pressure (Hypertension) Father  Dementia Father  Myocardial Infarction (Heart attack) Brother  Alcohol abuse Brother  No Known Problems Maternal Grandmother  No Known Problems Maternal Grandfather  No Known Problems Paternal Grandmother  Diabetes Paternal Grandfather  No Known Problems Daughter  Asthma Son  Colon cancer Neg Hx  Breast cancer Neg Hx   Social History:   Socioeconomic History:  Marital status: Married  Number of children: 2  Occupational History  Occupation: Garment/textile technologist  Tobacco Use  Smoking status: Never  Smokeless tobacco: Never  Vaping Use  Vaping Use: Never used  Substance and Sexual Activity  Alcohol use: Not Currently  Comment: once in a while  Drug use: Never  Sexual activity: Yes  Partners: Female  Birth control/protection: Surgical, None   Review of Systems:  A comprehensive 14 point ROS was performed, reviewed, and the pertinent orthopaedic findings are documented in the HPI.  Physical Exam: Vitals:  04/19/22 0940  Weight: (!) 105.7 kg (233 lb)  Height: 185.4 cm (6\' 1" )  PainSc: 4  PainLoc: Wrist   General/Constitutional: The patient appears to be well-nourished, well-developed, and in no acute distress. Neuro/Psych: Normal mood and  affect, oriented to person, place and time. Eyes: Non-icteric. Pupils are equal, round, and reactive to light, and exhibit synchronous movement. ENT: Unremarkable. Lymphatic: No palpable adenopathy. Respiratory: Lungs clear to auscultation, Normal chest excursion, No wheezes, and Non-labored breathing Cardiovascular: Regular rate and rhythm. No murmurs. and No edema, swelling or tenderness, except as noted in detailed exam. Integumentary: No impressive skin lesions present, except as noted in detailed exam. Musculoskeletal: Unremarkable, except as noted in detailed exam.  Left wrist/hand exam: Skin inspection of the left wrist and hand is unremarkable. No swelling, erythema, ecchymosis, abrasions, or other skin abnormalities are identified. He has moderate tenderness to palpation along the radial aspect of the wrist near the first dorsal compartment. These symptoms are reproduced with passive flexion of the thumb/CMC joint, consistent with a positive Finkelstein's test. He exhibits full active and passive range of motion of his wrist without any pain or catching. He is able to actively flex and extend all digits fully without any pain or triggering. He is neurovascular intact to all digits. He exhibits a positive Phalen's test as well as an equivocally positive Tinel's sign over the carpal tunnel.  Assessment: 1. Uncontrolled type 2 diabetes mellitus with hyperglycemia.  2. Carpal tunnel syndrome, left. 3. De Quervain's tenosynovitis.   Plan: The treatment options were discussed with the patient. In addition, patient educational materials were provided regarding the diagnosis and treatment options. The patient is quite frustrated by his symptoms and functional limitations, and is ready to consider more aggressive treatment options. Therefore, I have recommended a surgical procedure, specifically an endoscopic left carpal tunnel release with release of the first dorsal compartment. The procedure was  discussed with the patient, as were the potential risks (including bleeding, infection, nerve and/or blood vessel injury, persistent or recurrent pain/paresthesias, weakness of grip, need for further surgery, blood clots, strokes, heart attacks and/or arhythmias, pneumonia, etc.) and benefits. The patient states his/her understanding and wishes to proceed. All of the patient's questions and concerns were answered. He can call any time with further concerns. He will follow up post-surgery, routine.    H&P reviewed and patient re-examined. No changes.

## 2022-05-10 ENCOUNTER — Encounter: Payer: Self-pay | Admitting: Surgery

## 2022-06-19 DIAGNOSIS — E1159 Type 2 diabetes mellitus with other circulatory complications: Secondary | ICD-10-CM | POA: Diagnosis not present

## 2022-06-19 DIAGNOSIS — E1142 Type 2 diabetes mellitus with diabetic polyneuropathy: Secondary | ICD-10-CM | POA: Diagnosis not present

## 2022-06-19 DIAGNOSIS — E1169 Type 2 diabetes mellitus with other specified complication: Secondary | ICD-10-CM | POA: Diagnosis not present

## 2022-06-19 DIAGNOSIS — I152 Hypertension secondary to endocrine disorders: Secondary | ICD-10-CM | POA: Diagnosis not present

## 2022-06-19 LAB — HEMOGLOBIN A1C: Hemoglobin A1C: 5.7

## 2022-06-19 LAB — MICROALBUMIN / CREATININE URINE RATIO: Microalb Creat Ratio: 7.3

## 2022-07-04 ENCOUNTER — Ambulatory Visit (INDEPENDENT_AMBULATORY_CARE_PROVIDER_SITE_OTHER): Payer: Medicare Other | Admitting: Family Medicine

## 2022-07-04 ENCOUNTER — Encounter: Payer: Self-pay | Admitting: Family Medicine

## 2022-07-04 VITALS — BP 128/74 | HR 70 | Ht 73.0 in | Wt 226.0 lb

## 2022-07-04 DIAGNOSIS — F3341 Major depressive disorder, recurrent, in partial remission: Secondary | ICD-10-CM

## 2022-07-04 DIAGNOSIS — Z8659 Personal history of other mental and behavioral disorders: Secondary | ICD-10-CM

## 2022-07-04 DIAGNOSIS — E119 Type 2 diabetes mellitus without complications: Secondary | ICD-10-CM

## 2022-07-04 DIAGNOSIS — K219 Gastro-esophageal reflux disease without esophagitis: Secondary | ICD-10-CM

## 2022-07-04 DIAGNOSIS — Z7984 Long term (current) use of oral hypoglycemic drugs: Secondary | ICD-10-CM

## 2022-07-04 DIAGNOSIS — F419 Anxiety disorder, unspecified: Secondary | ICD-10-CM

## 2022-07-04 DIAGNOSIS — I10 Essential (primary) hypertension: Secondary | ICD-10-CM

## 2022-07-04 DIAGNOSIS — Z7985 Long-term (current) use of injectable non-insulin antidiabetic drugs: Secondary | ICD-10-CM

## 2022-07-04 DIAGNOSIS — E7801 Familial hypercholesterolemia: Secondary | ICD-10-CM | POA: Diagnosis not present

## 2022-07-04 MED ORDER — LOSARTAN POTASSIUM 100 MG PO TABS: 50.0000 mg | ORAL_TABLET | Freq: Every day | ORAL | 1 refills | Status: DC

## 2022-07-04 MED ORDER — AMLODIPINE BESYLATE 10 MG PO TABS: 10.0000 mg | ORAL_TABLET | Freq: Every day | ORAL | 1 refills | Status: DC

## 2022-07-04 MED ORDER — SERTRALINE HCL 50 MG PO TABS: 50.0000 mg | ORAL_TABLET | Freq: Every day | ORAL | 1 refills | Status: DC

## 2022-07-04 MED ORDER — ROSUVASTATIN CALCIUM 10 MG PO TABS: 10.0000 mg | ORAL_TABLET | Freq: Every day | ORAL | 1 refills | Status: DC

## 2022-07-04 MED ORDER — BUPROPION HCL 75 MG PO TABS: 75.0000 mg | ORAL_TABLET | Freq: Two times a day (BID) | ORAL | 1 refills | Status: DC

## 2022-07-04 MED ORDER — PANTOPRAZOLE SODIUM 40 MG PO TBEC: 40.0000 mg | DELAYED_RELEASE_TABLET | Freq: Every day | ORAL | 1 refills | Status: DC

## 2022-07-04 MED ORDER — GLIPIZIDE 5 MG PO TABS: 5.0000 mg | ORAL_TABLET | Freq: Two times a day (BID) | ORAL | Status: DC

## 2022-07-04 NOTE — Progress Notes (Signed)
Date:  07/04/2022   Name:  Raymond [name]   DOB:  05/07/1955   MRN:  [id number]   Chief Complaint: Hypertension, Depression, Gastroesophageal Reflux, and Hyperlipidemia  Hypertension This is a chronic problem. The current episode started more than 1 year ago. The problem has been gradually improving since onset. The problem is controlled. Pertinent negatives include no anxiety, blurred vision, chest pain, headaches, malaise/fatigue, neck pain, orthopnea, palpitations, peripheral edema, PND, shortness of breath or sweats. There are no associated agents to hypertension. Risk factors for coronary artery disease include dyslipidemia and male gender. Past treatments include angiotensin blockers and calcium channel blockers. The current treatment provides moderate improvement. There are no compliance problems.  There is no history of angina, kidney disease, CAD/MI, CVA, heart failure, left ventricular hypertrophy, PVD or retinopathy. There is no history of chronic renal disease, a hypertension causing med or renovascular disease.  Depression        This is a chronic problem.  The current episode started more than 1 year ago.   The problem has been gradually improving since onset.  Associated symptoms include no decreased concentration, no fatigue, no helplessness, no hopelessness, does not have insomnia, not irritable, no restlessness, no decreased interest, no appetite change, no body aches, no myalgias, no headaches, no indigestion, not sad and no suicidal ideas.  Past treatments include SSRIs - Selective serotonin reuptake inhibitors.  Compliance with treatment is good.  Previous treatment provided moderate relief.   Pertinent negatives include no anxiety. Gastroesophageal Reflux He reports no abdominal pain, no belching, no chest pain, no choking, no coughing, no dysphagia, no heartburn, no sore throat or no wheezing. This is a chronic problem. The problem has been gradually improving. Pertinent  negatives include no anemia, fatigue, melena, muscle weakness, orthopnea or weight loss. He has tried a PPI for the symptoms. The treatment provided moderate relief.  Hyperlipidemia The current episode started more than 1 year ago. The problem is controlled. Recent lipid tests were reviewed and are normal. He has no history of chronic renal disease or diabetes. Pertinent negatives include no chest pain, myalgias or shortness of breath. Current antihyperlipidemic treatment includes statins. The current treatment provides moderate improvement of lipids. There are no compliance problems.     Lab Results  Component Value Date   NA 140 05/09/2022   K 4.3 05/09/2022   CO2 28 05/03/2022   GLUCOSE 173 (H) 05/09/2022   BUN 22 05/09/2022   CREATININE 0.90 05/09/2022   CALCIUM 9.4 05/03/2022   EGFR 95 07/12/2020   GFRNONAA >60 05/03/2022   Lab Results  Component Value Date   CHOL 144 01/03/2022   HDL 40 01/03/2022   LDLCALC 75 01/03/2022   TRIG 168 (H) 01/03/2022   CHOLHDL 3.3 07/24/2016   Lab Results  Component Value Date   TSH 2.620 11/07/2018   Lab Results  Component Value Date   HGBA1C 5.7 06/19/2022   Lab Results  Component Value Date   WBC 7.8 05/03/2022   HGB 15.6 05/09/2022   HCT 46.0 05/09/2022   MCV 85.3 05/03/2022   PLT 230 05/03/2022   Lab Results  Component Value Date   ALT 22 11/08/2021   AST 18 11/08/2021   ALKPHOS 61 11/08/2021   BILITOT 0.9 11/08/2021   No results found for: "25OHVITD2", "25OHVITD3", "VD25OH"   Review of Systems  Constitutional:  Negative for appetite change, fatigue, malaise/fatigue and weight loss.  HENT:  Negative for congestion, nosebleeds, postnasal drip, sinus pressure,  sinus pain and sore throat.   Eyes:  Negative for blurred vision, redness and visual disturbance.  Respiratory:  Negative for cough, choking, chest tightness, shortness of breath and wheezing.   Cardiovascular:  Negative for chest pain, palpitations, orthopnea and  PND.  Gastrointestinal:  Negative for abdominal pain, constipation, diarrhea, dysphagia, heartburn and melena.  Musculoskeletal:  Negative for myalgias, muscle weakness and neck pain.  Neurological:  Negative for headaches.  Psychiatric/Behavioral:  Positive for depression. Negative for decreased concentration and suicidal ideas. The patient does not have insomnia.     Patient Active Problem List   Diagnosis Date Noted   Varicose veins of leg with pain, left 12/30/2018   Carpal tunnel syndrome, right 08/09/2018   Primary osteoarthritis of left shoulder 08/09/2018   Rotator cuff tendinitis, left 08/09/2018   Strain of left knee 08/06/2017   Obesity (BMI 35.0-39.9 without comorbidity) 08/03/2017   Status post arthroscopy of left knee 08/03/2017   Complex tear of medial meniscus of left knee as current injury 07/26/2017   Chest pain 07/24/2016   Uncontrolled type 2 diabetes mellitus without complication, without long-term current use of insulin 07/24/2016   Status post total hip replacement, left 01/25/2016   Benign essential HTN 07/21/2015   Depression 07/21/2015   Hyperlipidemia, mixed 07/21/2015   GERD (gastroesophageal reflux disease) 12/04/2014   Biceps tendinitis 07/21/2014   Impingement syndrome of shoulder 07/21/2014   Primary osteoarthritis of right knee 08/14/2013   Neuritis or radiculitis due to rupture of lumbar intervertebral disc 08/14/2013    No Known Allergies  Past Surgical History:  Procedure Laterality Date   CARPAL TUNNEL RELEASE Right 01/02/2019   Procedure: CARPAL TUNNEL RELEASE ENDOSCOPIC;  Surgeon: Christena Flake, MD;  Location: [facility];  Service: Orthopedics;  Laterality: Right;   CARPAL TUNNEL RELEASE Left 05/09/2022   Procedure: ENDOSCOPIC LEFT CARPAL TUNNEL RELEASE WITH RELEASE OF FIRST DORSAL COMPARTMENT LEFT WRIST;  Surgeon: Christena Flake, MD;  Location: [facility];  Service: Orthopedics;  Laterality: Left;   CHOLECYSTECTOMY  1988   CHONDROPLASTY Right  10/05/2009   knee- shaving patellofemoral joint   COLONOSCOPY  2010   COLONOSCOPY WITH PROPOFOL N/A 10/12/2021   Procedure: COLONOSCOPY WITH PROPOFOL;  Surgeon: Earline Mayotte, MD;  Location: [facility];  Service: Endoscopy;  Laterality: N/A;   ENDOVENOUS ABLATION SAPHENOUS VEIN W/ LASER     INJECTION KNEE Left 12/23/2019   Procedure: KNEE INJECTION;  Surgeon: Christena Flake, MD;  Location: [facility];  Service: Orthopedics;  Laterality: Left;   KNEE ARTHROSCOPY Left 07/25/2017   Procedure: Arthroscopic partial medial meniscectomy; arthroscopic debridement; and arthroscopic abrasion chondroplasty of medial femoral condyle, lateral tibial plateau, and femoral trochlea; left knee.;  Surgeon: Christena Flake, MD;  Location: [facility];  Service: Orthopedics;  Laterality: Left;   KNEE ARTHROSCOPY WITH MEDIAL MENISECTOMY Right    Partial medial and lateral menisectomy   REVERSE SHOULDER ARTHROPLASTY Right 11/15/2021   Procedure: ANATOMIC  SHOULDER ARTHROPLASTY WITH BICEPS TENODESIS;  Surgeon: Christena Flake, MD;  Location: [facility];  Service: Orthopedics;  Laterality: Right;   SHOULDER ARTHROSCOPY Left 08/10/2014   Procedure: ARTHROSCOPY SHOULDER WITH LIMITED DEBRIDEMENT, REMOVAL OF LOOSE BODY AND RELEASE OF LONG END BICEPS TENDON;  Surgeon: Erin Sons, MD;  Location: [facility];  Service: Orthopedics;  Laterality: Left;   TOTAL HIP ARTHROPLASTY Left 01/25/2016   Procedure: TOTAL HIP ARTHROPLASTY;  Surgeon: Christena Flake, MD;  Location: [facility];  Service: Orthopedics;  Laterality: Left;  TOTAL KNEE ARTHROPLASTY Right 12/23/2019   Procedure: TOTAL KNEE ARTHROPLASTY;  Surgeon: Christena Flake, MD;  Location: [facility];  Service: Orthopedics;  Laterality: Right;   TOTAL KNEE ARTHROPLASTY Left 11/11/2020   Procedure: TOTAL KNEE ARTHROPLASTY;  Surgeon: Christena Flake, MD;  Location: [facility];  Service: Orthopedics;  Laterality: Left;    Social History   Tobacco Use   Smoking  status: Never   Smokeless tobacco: Never  Vaping Use   Vaping Use: Never used  Substance Use Topics   Alcohol use: Yes    Comment: rare   Drug use: No     Medication list has been reviewed and updated.  Current Meds  Medication Sig   ACCU-CHEK AVIVA PLUS test strip TEST AS DIRECTED   acetaminophen (TYLENOL) 325 MG tablet Take 650 mg by mouth every 6 (six) hours as needed.   amLODipine (NORVASC) 10 MG tablet Take 1 tablet (10 mg total) by mouth daily.   aspirin EC 81 MG tablet Take 81 mg by mouth daily. Swallow whole.   buPROPion (WELLBUTRIN) 75 MG tablet Take 1 tablet (75 mg total) by mouth 2 (two) times daily.   cholecalciferol (VITAMIN D3) 25 MCG (1000 UNIT) tablet Take 1,000 Units by mouth daily.   cyclobenzaprine (FLEXERIL) 10 MG tablet Take 10 mg by mouth 2 (two) times daily as needed.   fluticasone (FLONASE) 50 MCG/ACT nasal spray Place 2 sprays into both nostrils daily as needed for allergies or rhinitis.   glipiZIDE (GLUCOTROL) 5 MG tablet Take 2 tablets (10 mg total) by mouth 2 (two) times daily before a meal. (Patient taking differently: Take 10 mg by mouth 2 (two) times daily before a meal. 10 in the am and 5mg  in the pm/ [place])   glucosamine-chondroitin 500-400 MG tablet Take 2 tablets by mouth daily.   losartan (COZAAR) 100 MG tablet Take 0.5 tablets (50 mg total) by mouth daily.   metFORMIN (GLUCOPHAGE) 500 MG tablet Take 2 tablets (1,000 mg total) by mouth 2 (two) times daily with a meal.   Omega-3 Fatty Acids (FISH OIL) 1000 MG CPDR Take 1 capsule by mouth daily.   pantoprazole (PROTONIX) 40 MG tablet Take 1 tablet (40 mg total) by mouth daily.   rosuvastatin (CRESTOR) 10 MG tablet Take 1 tablet (10 mg total) by mouth daily.   Semaglutide,0.25 or 0.5MG /DOS, 2 MG/3ML SOPN Inject into the skin.   sertraline (ZOLOFT) 50 MG tablet Take 1 tablet (50 mg total) by mouth daily.   triamterene-hydrochlorothiazide (DYAZIDE) 37.5-25 MG capsule Take 1 each (1 capsule total) by  mouth daily.       07/04/2022   10:39 AM 01/03/2022   10:30 AM 07/19/2021    9:16 AM 01/17/2021    1:45 PM  GAD 7 : Generalized Anxiety Score  Nervous, Anxious, on Edge 0 0 0 0  Control/stop worrying 0 0 0 0  Worry too much - different things 0 0 0 0  Trouble relaxing 0 0 0 0  Restless 0 0 0 0  Easily annoyed or irritable 0 0 0 1  Afraid - awful might happen 0 0 0 0  Total GAD 7 Score 0 0 0 1  Anxiety Difficulty Not difficult at all Not difficult at all Not difficult at all        07/04/2022   10:39 AM 01/03/2022   10:30 AM 07/19/2021    9:16 AM  Depression screen PHQ 2/9  Decreased Interest 0 0 0  Down, Depressed, Hopeless  0 0 0  PHQ - 2 Score 0 0 0  Altered sleeping 0 0 0  Tired, decreased energy 0 0 0  Change in appetite 0 0 0  Feeling bad or failure about yourself  0 0 0  Trouble concentrating 0 0 0  Moving slowly or fidgety/restless 0 0 0  Suicidal thoughts 0 0 0  PHQ-9 Score 0 0 0  Difficult doing work/chores Not difficult at all Not difficult at all Not difficult at all    BP Readings from Last 3 Encounters:  07/04/22 128/74  05/09/22 112/71  01/03/22 118/72    Physical Exam Vitals and nursing note reviewed.  Constitutional:      General: He is not irritable. HENT:     Head: Normocephalic.     Right Ear: Tympanic membrane and external ear normal.     Left Ear: Tympanic membrane and external ear normal.     Nose: Nose normal.     Mouth/Throat:     Mouth: Mucous membranes are moist.  Eyes:     General: No scleral icterus.       Right eye: No discharge.        Left eye: No discharge.     Conjunctiva/sclera: Conjunctivae normal.     Pupils: Pupils are equal, round, and reactive to light.  Neck:     Thyroid: No thyromegaly.     Vascular: No JVD.     Trachea: No tracheal deviation.  Cardiovascular:     Rate and Rhythm: Normal rate and regular rhythm.     Heart sounds: Normal heart sounds. No murmur heard.    No friction rub. No gallop.  Pulmonary:      Effort: No respiratory distress.     Breath sounds: Normal breath sounds. No wheezing, rhonchi or rales.  Abdominal:     General: Bowel sounds are normal.     Palpations: Abdomen is soft. There is no hepatomegaly, splenomegaly or mass.     Tenderness: There is no abdominal tenderness. There is no guarding or rebound.  Musculoskeletal:        General: No tenderness. Normal range of motion.     Cervical back: Normal range of motion and neck supple.  Lymphadenopathy:     Cervical: No cervical adenopathy.  Skin:    General: Skin is warm.     Findings: No rash.  Neurological:     Mental Status: He is alert and oriented to person, place, and time.     Cranial Nerves: No cranial nerve deficit.     Deep Tendon Reflexes: Reflexes are normal and symmetric.     Wt Readings from Last 3 Encounters:  07/04/22 226 lb (102.5 kg)  05/09/22 240 lb (108.9 kg)  01/03/22 248 lb (112.5 kg)    BP 128/74   Pulse 70   Ht 6\' 1"  (1.854 m)   Wt 226 lb (102.5 kg)   SpO2 97%   BMI 29.82 kg/m   Assessment and Plan: 1. Essential hypertension Chronic.  Controlled.  Stable.  Blood pressure today is 128/74.  Asymptomatic.  Tolerating medication well.  Continue amlodipine 10 mg once a day and losartan 100 mg 1/2 tablet once a day.  Will discontinue triamcinolone and that he has not been taking this for the past several weeks.  Will check renal function panel for electrolytes and GFR. - amLODipine (NORVASC) 10 MG tablet; Take 1 tablet (10 mg total) by mouth daily.  Dispense: 90 tablet; Refill: 1 - losartan (COZAAR)  100 MG tablet; Take 0.5 tablets (50 mg total) by mouth daily.  Dispense: 45 tablet; Refill: 1 - Renal Function Panel  2. Recurrent major depressive disorder, in partial remission (HCC) Chronic.  Controlled.  Stable.  PHQ is 0 GAD score 0 continue sertraline 50 mg once a day and Wellbutrin 75 mg twice a day. - buPROPion (WELLBUTRIN) 75 MG tablet; Take 1 tablet (75 mg total) by mouth 2 (two)  times daily.  Dispense: 180 tablet; Refill: 1 - sertraline (ZOLOFT) 50 MG tablet; Take 1 tablet (50 mg total) by mouth daily.  Dispense: 90 tablet; Refill: 1  3. Gastroesophageal reflux disease, unspecified whether esophagitis present Chronic.  Controlled.  Stable.  Continue pantoprazole 40 mg once a day. - pantoprazole (PROTONIX) 40 MG tablet; Take 1 tablet (40 mg total) by mouth daily.  Dispense: 90 tablet; Refill: 1  4. Familial hypercholesterolemia Chronic.  Controlled.  Stable.  Continue rosuvastatin 10 mg once a day.  Review of previous lipid panel is acceptable. - rosuvastatin (CRESTOR) 10 MG tablet; Take 1 tablet (10 mg total) by mouth daily.  Dispense: 90 tablet; Refill: 1  5. Anxiety Chronic.  Controlled.  Stable.  Continue sertraline 50 mg once a day.  GAD score is 0 - sertraline (ZOLOFT) 50 MG tablet; Take 1 tablet (50 mg total) by mouth daily.  Dispense: 90 tablet; Refill: 1  6. History of posttraumatic stress disorder (PTSD) As noted above - sertraline (ZOLOFT) 50 MG tablet; Take 1 tablet (50 mg total) by mouth daily.  Dispense: 90 tablet; Refill: 1  7. Type 2 diabetes mellitus without complication, without long-term current use of insulin [facility]) Patient is followed by Dr. Gershon Crane.  He is on Ozempic but did not realize he was supposed to be taking glipizide 5 mg twice a day and has still been taking 10 and 5 mg.  Patient is going to continue it just the 5 mg twice a day and this is duly noted. - glipiZIDE (GLUCOTROL) 5 MG tablet; Take 1 tablet (5 mg total) by mouth 2 (two) times daily before a meal.     Elizabeth Sauer, MD

## 2022-07-05 LAB — RENAL FUNCTION PANEL
Albumin: 4.8 g/dL (ref 3.9–4.9)
BUN/Creatinine Ratio: 20 (ref 10–24)
BUN: 17 mg/dL (ref 8–27)
CO2: 24 mmol/L (ref 20–29)
Calcium: 10.1 mg/dL (ref 8.6–10.2)
Chloride: 100 mmol/L (ref 96–106)
Creatinine, Ser: 0.87 mg/dL (ref 0.76–1.27)
Glucose: 83 mg/dL (ref 70–99)
Phosphorus: 3.9 mg/dL (ref 2.8–4.1)
Potassium: 4.4 mmol/L (ref 3.5–5.2)
Sodium: 141 mmol/L (ref 134–144)
eGFR: 95 mL/min/{1.73_m2} (ref >59)

## 2022-07-27 ENCOUNTER — Other Ambulatory Visit: Payer: Self-pay | Admitting: Family Medicine

## 2022-07-27 DIAGNOSIS — K219 Gastro-esophageal reflux disease without esophagitis: Secondary | ICD-10-CM

## 2022-07-27 DIAGNOSIS — F3341 Major depressive disorder, recurrent, in partial remission: Secondary | ICD-10-CM

## 2022-07-27 DIAGNOSIS — I10 Essential (primary) hypertension: Secondary | ICD-10-CM

## 2022-07-27 DIAGNOSIS — E119 Type 2 diabetes mellitus without complications: Secondary | ICD-10-CM

## 2022-07-27 DIAGNOSIS — E7801 Familial hypercholesterolemia: Secondary | ICD-10-CM

## 2022-07-27 DIAGNOSIS — F419 Anxiety disorder, unspecified: Secondary | ICD-10-CM

## 2022-07-27 DIAGNOSIS — Z8659 Personal history of other mental and behavioral disorders: Secondary | ICD-10-CM

## 2022-07-27 NOTE — Telephone Encounter (Signed)
Medication Refill - Medication: metFORMIN (GLUCOPHAGE) 500 MG tablet   The following medications were sent to the wrong pharmacy on 5/14 and transmission failed. Please resend to the pharmacy below. amLODipine (NORVASC) 10 MG tablet buPROPion (WELLBUTRIN) 75 MG tablet   glipiZIDE (GLUCOTROL) 5 MG tablet  losartan (COZAAR) 100 MG tablet  pantoprazole (PROTONIX) 40 MG tablet  rosuvastatin (CRESTOR) 10 MG tablet  sertraline (ZOLOFT) 50 MG tablet    Has the patient contacted their pharmacy? Yes.   (Agent: If no, request that the patient contact the pharmacy for the refill. If patient does not wish to contact the pharmacy document the reason why and proceed with request.) (Agent: If yes, when and what did the pharmacy advise?)  Preferred Pharmacy (with phone number or street name):  [facility]) [facility] - [place], [place] - [address] Phone: [phone]  Fax: [phone]     Has the patient been seen for an appointment in the last year OR does the patient have an upcoming appointment? Yes.    Agent: Please be advised that RX refills may take up to 3 business days. We ask that you follow-up with your pharmacy.

## 2022-07-28 MED ORDER — BUPROPION HCL 75 MG PO TABS: 75.0000 mg | ORAL_TABLET | Freq: Two times a day (BID) | ORAL | 1 refills | Status: DC

## 2022-07-28 MED ORDER — AMLODIPINE BESYLATE 10 MG PO TABS: 10.0000 mg | ORAL_TABLET | Freq: Every day | ORAL | 1 refills | Status: DC

## 2022-07-28 MED ORDER — GLIPIZIDE 5 MG PO TABS: 5.0000 mg | ORAL_TABLET | Freq: Two times a day (BID) | ORAL | 1 refills | Status: DC

## 2022-07-28 MED ORDER — ROSUVASTATIN CALCIUM 10 MG PO TABS: 10.0000 mg | ORAL_TABLET | Freq: Every day | ORAL | 1 refills | Status: DC

## 2022-07-28 MED ORDER — PANTOPRAZOLE SODIUM 40 MG PO TBEC: 40.0000 mg | DELAYED_RELEASE_TABLET | Freq: Every day | ORAL | 1 refills | Status: DC

## 2022-07-28 MED ORDER — LOSARTAN POTASSIUM 100 MG PO TABS: 50.0000 mg | ORAL_TABLET | Freq: Every day | ORAL | 1 refills | Status: DC

## 2022-07-28 MED ORDER — SERTRALINE HCL 50 MG PO TABS: 50.0000 mg | ORAL_TABLET | Freq: Every day | ORAL | 1 refills | Status: DC

## 2022-07-28 MED ORDER — METFORMIN HCL 500 MG PO TABS: 1000.0000 mg | ORAL_TABLET | Freq: Two times a day (BID) | ORAL | 1 refills | Status: DC

## 2022-07-28 NOTE — Telephone Encounter (Signed)
Requested Prescriptions  Pending Prescriptions Disp Refills   amLODipine (NORVASC) 10 MG tablet 90 tablet 1    Sig: Take 1 tablet (10 mg total) by mouth daily.     Cardiovascular: Calcium Channel Blockers 2 Passed - 07/27/2022  3:16 PM      Passed - Last BP in normal range    BP Readings from Last 1 Encounters:  07/04/22 128/74         Passed - Last Heart Rate in normal range    Pulse Readings from Last 1 Encounters:  07/04/22 70         Passed - Valid encounter within last 6 months    Recent Outpatient Visits           3 weeks ago Essential hypertension   [place] [facility], [facility]   6 months ago Essential hypertension   [place] [facility]   1 year ago Essential hypertension   [place] [facility], [facility]   1 year ago Need for pneumococcal vaccination   [place] [facility], [facility]   2 years ago Essential hypertension   [place] [facility] at [facility], MD       Future Appointments             In 5 months Duanne Limerick, MD [facility]             buPROPion [facility]) 75 MG tablet 180 tablet 1    Sig: Take 1 tablet (75 mg total) by mouth 2 (two) times daily.     Psychiatry: Antidepressants - bupropion Passed - 07/27/2022  3:16 PM      Passed - Cr in normal range and within 360 days    Creatinine, Ser  Date Value Ref Range Status  07/04/2022 0.87 0.76 - 1.27 mg/dL Final         Passed - AST in normal range and within 360 days    AST  Date Value Ref Range Status  11/08/2021 18 15 - 41 U/L Final         Passed - ALT in normal range and within 360 days    ALT  Date Value Ref Range Status  11/08/2021 22 0 - 44 U/L Final          Passed - Completed PHQ-2 or PHQ-9 in the last 360 days      Passed - Last BP in normal range    BP Readings from Last 1 Encounters:  07/04/22 128/74         Passed - Valid encounter within last 6 months    Recent Outpatient Visits           3 weeks ago Essential hypertension   [place] [facility], [facility]   6 months ago Essential hypertension   [place] [facility], [facility]   1 year ago Essential hypertension   [place] [facility], [facility]   1 year ago Need for pneumococcal vaccination   [facility] at  [facility], [facility]   2 years ago Essential hypertension   [place] [facility], MD       Future Appointments             In 5 months Duanne Limerick, MD [facility] & [facility] at [facility], [facility]             metFORMIN (GLUCOPHAGE) 500 MG tablet 360 tablet 1    Sig: Take 2 tablets (1,000 mg total) by mouth 2 (two) times daily with a meal.     Endocrinology:  Diabetes - Biguanides Failed - 07/27/2022  3:16 PM      Failed - B12 Level in normal range and within 720 days    No results found for: "VITAMINB12"       Passed - Cr in normal range and within 360 days    Creatinine, Ser  Date Value Ref Range Status  07/04/2022 0.87 0.76 - 1.27 mg/dL Final         Passed - HBA1C is between 0 and 7.9 and within 180 days    Hemoglobin A1C  Date Value Ref Range Status  06/19/2022 5.7  Final         Passed - eGFR in normal range and within 360 days    GFR calc Af Amer  Date Value Ref Range Status  04/30/2019 106 >59 mL/min/1.73 Final   GFR, Estimated  Date Value Ref Range Status  05/03/2022 >60 >60 mL/min Final    Comment:    (NOTE) Calculated using the CKD-EPI Creatinine Equation  (2021)    eGFR  Date Value Ref Range Status  07/04/2022 95 >59 mL/min/1.73 Final         Passed - Valid encounter within last 6 months    Recent Outpatient Visits           3 weeks ago Essential hypertension   [place] [facility]   6 months ago Essential hypertension   [place] [facility], [facility]   1 year ago Essential hypertension   [place] [facility], MD   1 year ago Need for pneumococcal vaccination   [place] [facility], MD   2 years ago Essential hypertension   [place] [facility], MD       Future Appointments             In 5 months Duanne Limerick, MD [facility] & [facility] Medicine at [facility], [facility]            Passed - CBC within normal limits and completed in the last 12 months    WBC  Date Value Ref Range Status  05/03/2022 7.8 4.0 - 10.5 K/uL Final   RBC  Date Value Ref Range Status  05/03/2022 5.11 4.22 - 5.81 MIL/uL Final   Hemoglobin  Date Value Ref Range Status  05/09/2022 15.6 13.0 - 17.0 g/dL Final  40/98/1191 47.8 13.0 - 17.7 g/dL Final   HCT  Date Value Ref Range Status  05/09/2022 46.0 39.0 - 52.0 % Final   Hematocrit  Date Value Ref Range Status  11/07/2018 47.4 37.5 -  51.0 % Final   MCHC  Date Value Ref Range Status  05/03/2022 33.7 30.0 - 36.0 g/dL Final   [facility]  Date Value Ref Range Status  05/03/2022 28.8 26.0 - 34.0 pg Final   MCV  Date Value Ref Range Status  05/03/2022 85.3 80.0 - 100.0 fL Final  11/07/2018 85 79 - 97 fL Final   No results found for: "PLTCOUNTKUC", "LABPLAT", "POCPLA" RDW  Date Value Ref Range Status  05/03/2022 13.4 11.5 - 15.5 % Final  11/07/2018 13.5 11.6 - 15.4 % Final           glipiZIDE (GLUCOTROL) 5 MG tablet 180 tablet 1    Sig: Take 1 tablet (5 mg total) by mouth 2 (two) times daily before a meal.     Endocrinology:  Diabetes - Sulfonylureas Passed - 07/27/2022  3:16 PM      Passed - HBA1C is between 0 and 7.9 and within 180 days    Hemoglobin A1C  Date Value Ref Range Status  06/19/2022 5.7  Final         Passed - Cr in normal range and within 360 days    Creatinine, Ser  Date Value Ref Range Status  07/04/2022 0.87 0.76 - 1.27 mg/dL Final         Passed - Valid encounter within last 6 months    Recent Outpatient Visits           3 weeks ago Essential hypertension   [place] [facility]   6 months ago Essential hypertension   [place] [facility]   1 year ago Essential hypertension   [place] [facility], [facility]   1 year ago Need for pneumococcal vaccination   [place] [facility], MD   2 years ago Essential hypertension   [place] [facility], MD       Future Appointments             In 5 months Duanne Limerick, MD [facility] at [facility]             losartan (COZAAR) 100 MG tablet 45 tablet 1    Sig: Take 0.5 tablets (50 mg total) by mouth daily.     Cardiovascular:  Angiotensin Receptor Blockers Passed - 07/27/2022  3:16 PM      Passed - Cr in normal range and within 180 days    Creatinine, Ser  Date Value Ref Range Status  07/04/2022 0.87 0.76 - 1.27 mg/dL Final         Passed - K in normal range and within 180 days    Potassium  Date Value Ref Range Status  07/04/2022 4.4 3.5 - 5.2 mmol/L Final         Passed - Patient is not pregnant      Passed - Last BP in normal range    BP Readings  from Last 1 Encounters:  07/04/22 128/74         Passed - Valid encounter within last 6 months    Recent Outpatient Visits           3 weeks ago Essential hypertension   [place] [facility]   6 months ago Essential hypertension   [place] [facility], MD   1 year ago Essential hypertension   [place] [facility], [facility]   1 year ago Need for pneumococcal vaccination   [place] [facility], MD   2 years ago Essential hypertension   [place] [facility], MD       Future Appointments             In 5 months Duanne Limerick, MD [facility] at [facility]             pantoprazole (PROTONIX) 40 MG tablet 90 tablet 1    Sig: Take 1 tablet (40 mg total) by mouth daily.     Gastroenterology: Proton Pump Inhibitors Passed - 07/27/2022  3:16 PM      Passed - Valid encounter within last 12 months    Recent Outpatient Visits           3 weeks ago Essential hypertension   [place] [facility]   6 months ago Essential hypertension   [place] [facility], [place]   1 year ago Essential hypertension   [place] [facility]   1 year ago Need for pneumococcal vaccination   [place] [facility], [facility]   2 years ago Essential hypertension   [place] [facility], [facility]       Future Appointments             In 5 months Duanne Limerick, MD [facility] Care & [facility]  Medicine at [facility], [facility]             rosuvastatin (CRESTOR) 10 MG tablet 90 tablet 1    Sig: Take 1 tablet (10 mg total) by mouth daily.     Cardiovascular:  Antilipid - Statins 2 Failed - 07/27/2022  3:16 PM      Failed - Lipid Panel in normal range within the last 12 months    Cholesterol, Total  Date Value Ref Range Status  01/03/2022 144 100 - 199 mg/dL Final   LDL Chol Calc (NIH)  Date Value Ref Range Status  01/03/2022 75 0 - 99 mg/dL Final   HDL  Date Value Ref Range Status  01/03/2022 40 >39 mg/dL Final   Triglycerides  Date Value Ref Range Status  01/03/2022 168 (H) 0 - 149 mg/dL Final         Passed - Cr in normal range and within 360 days    Creatinine, Ser  Date Value Ref Range Status  07/04/2022 0.87 0.76 - 1.27 mg/dL Final         Passed - Patient is not pregnant      Passed - Valid encounter within last 12 months    Recent Outpatient Visits           3 weeks ago Essential hypertension   [place]  [facility]   6 months ago Essential hypertension   [place] [facility], MD   1 year ago Essential hypertension   [place] [facility], [facility]   1 year ago Need for pneumococcal vaccination   [place] [facility], [facility]   2 years ago Essential hypertension   [place] [facility], MD       Future Appointments             In 5 months Duanne Limerick, MD [facility] & [facility] Medicine at [facility], [facility]             sertraline (ZOLOFT) 50 MG tablet 90 tablet 1    Sig: Take 1 tablet (50 mg total) by mouth daily.     Psychiatry:  Antidepressants - SSRI - sertraline Passed - 07/27/2022  3:16 PM      Passed - AST in normal range and within 360  days    AST  Date Value Ref Range Status  11/08/2021 18 15 - 41 U/L Final         Passed - ALT in normal range and within 360 days    ALT  Date Value Ref Range Status  11/08/2021 22 0 - 44 U/L Final         Passed - Completed PHQ-2 or PHQ-9 in the last 360 days      Passed - Valid encounter within last 6 months    Recent Outpatient Visits           3 weeks ago Essential hypertension   [place] [facility]   6 months ago Essential hypertension   [place] [facility], [facility]   1 year ago Essential hypertension   [place] [facility], [facility]   1 year ago Need for pneumococcal vaccination   [facility], MD   2 years ago Essential hypertension   [place] [facility], [facility]       Future Appointments             In 5 months Duanne Limerick, MD [facility] Primary Care & Sports Medicine at [facility]

## 2022-11-30 DIAGNOSIS — E1165 Type 2 diabetes mellitus with hyperglycemia: Secondary | ICD-10-CM | POA: Diagnosis not present

## 2022-11-30 DIAGNOSIS — E66811 Obesity, class 1: Secondary | ICD-10-CM | POA: Diagnosis not present

## 2022-11-30 DIAGNOSIS — M19012 Primary osteoarthritis, left shoulder: Secondary | ICD-10-CM | POA: Diagnosis not present

## 2022-12-18 DIAGNOSIS — E1142 Type 2 diabetes mellitus with diabetic polyneuropathy: Secondary | ICD-10-CM | POA: Diagnosis not present

## 2022-12-18 DIAGNOSIS — E1159 Type 2 diabetes mellitus with other circulatory complications: Secondary | ICD-10-CM | POA: Diagnosis not present

## 2022-12-18 DIAGNOSIS — I152 Hypertension secondary to endocrine disorders: Secondary | ICD-10-CM | POA: Diagnosis not present

## 2022-12-18 DIAGNOSIS — E1169 Type 2 diabetes mellitus with other specified complication: Secondary | ICD-10-CM | POA: Diagnosis not present

## 2022-12-25 ENCOUNTER — Other Ambulatory Visit: Payer: Self-pay

## 2022-12-25 DIAGNOSIS — I10 Essential (primary) hypertension: Secondary | ICD-10-CM

## 2022-12-25 MED ORDER — LOSARTAN POTASSIUM 100 MG PO TABS: 50.0000 mg | ORAL_TABLET | Freq: Every day | ORAL | 1 refills | Status: DC

## 2022-12-26 ENCOUNTER — Other Ambulatory Visit: Payer: Self-pay | Admitting: Family Medicine

## 2022-12-26 DIAGNOSIS — I10 Essential (primary) hypertension: Secondary | ICD-10-CM

## 2022-12-26 DIAGNOSIS — K219 Gastro-esophageal reflux disease without esophagitis: Secondary | ICD-10-CM

## 2022-12-26 NOTE — Telephone Encounter (Unsigned)
Copied from CRM [id number]. Topic: General - Other >> Dec 26, 2022  1:08 PM Everette C wrote: Reason for CRM: Medication Refill - Medication: losartan (COZAAR) 100 MG tablet [[id number]]  pantoprazole (PROTONIX) 40 MG tablet [[id number]]  Has the patient contacted their pharmacy? Yes.   (Agent: If no, request that the patient contact the pharmacy for the refill. If patient does not wish to contact the pharmacy document the reason why and proceed with request.) (Agent: If yes, when and what did the pharmacy advise?)  Preferred Pharmacy (with phone number or street name): [facility] - [place], [place] - [address] [place] [address] Phone: [phone] Fax: [phone] Hours: Not open 24 hours   Has the patient been seen for an appointment in the last year OR does the patient have an upcoming appointment? Yes.    Agent: Please be advised that RX refills may take up to 3 business days. We ask that you follow-up with your pharmacy.

## 2022-12-27 NOTE — Telephone Encounter (Signed)
Requests are too soon for refills.  Requested Prescriptions  Pending Prescriptions Disp Refills   losartan (COZAAR) 100 MG tablet 45 tablet 1    Sig: Take 0.5 tablets (50 mg total) by mouth daily.     Cardiovascular:  Angiotensin Receptor Blockers Passed - 12/26/2022  2:07 PM      Passed - Cr in normal range and within 180 days    Creatinine, Ser  Date Value Ref Range Status  07/04/2022 0.87 0.76 - 1.27 mg/dL Final         Passed - K in normal range and within 180 days    Potassium  Date Value Ref Range Status  07/04/2022 4.4 3.5 - 5.2 mmol/L Final         Passed - Patient is not pregnant      Passed - Last BP in normal range    BP Readings from Last 1 Encounters:  07/04/22 128/74         Passed - Valid encounter within last 6 months    Recent Outpatient Visits           5 months ago Essential hypertension   [place] [facility]   11 months ago Essential hypertension   [place] [facility], [facility]   1 year ago Essential hypertension   [place] [facility], [facility]   1 year ago Need for pneumococcal vaccination   [place] [facility], [facility]   2 years ago Essential hypertension   [place] [facility], [facility]       Future Appointments             In 2 weeks Duanne Limerick, MD [facility] at [facility]             pantoprazole (PROTONIX) 40 MG tablet 90 tablet 1    Sig: Take 1 tablet (40 mg total) by mouth daily.     Gastroenterology: Proton Pump Inhibitors Passed - 12/26/2022  2:07 PM      Passed - Valid encounter within last 12 months    Recent Outpatient Visits           5 months ago Essential hypertension   [place] [facility]   11 months ago Essential hypertension   [place] [facility]   1 year ago Essential hypertension   [place] [facility], [facility]   1 year ago Need for pneumococcal vaccination   [facility]   2 years ago Essential hypertension   [place] [facility]       Future Appointments             In 2 weeks Duanne Limerick, MD [facility] at [facility]

## 2022-12-29 ENCOUNTER — Other Ambulatory Visit: Payer: Self-pay

## 2022-12-29 DIAGNOSIS — I10 Essential (primary) hypertension: Secondary | ICD-10-CM

## 2022-12-29 MED ORDER — LOSARTAN POTASSIUM 100 MG PO TABS: 50.0000 mg | ORAL_TABLET | Freq: Every day | ORAL | 1 refills | Status: DC

## 2023-01-10 ENCOUNTER — Other Ambulatory Visit: Payer: Self-pay

## 2023-01-10 DIAGNOSIS — F419 Anxiety disorder, unspecified: Secondary | ICD-10-CM

## 2023-01-10 DIAGNOSIS — F3341 Major depressive disorder, recurrent, in partial remission: Secondary | ICD-10-CM

## 2023-01-10 DIAGNOSIS — I10 Essential (primary) hypertension: Secondary | ICD-10-CM

## 2023-01-10 DIAGNOSIS — Z8659 Personal history of other mental and behavioral disorders: Secondary | ICD-10-CM

## 2023-01-15 ENCOUNTER — Other Ambulatory Visit: Payer: Self-pay

## 2023-01-15 DIAGNOSIS — I10 Essential (primary) hypertension: Secondary | ICD-10-CM

## 2023-01-15 DIAGNOSIS — E119 Type 2 diabetes mellitus without complications: Secondary | ICD-10-CM

## 2023-01-16 ENCOUNTER — Ambulatory Visit (INDEPENDENT_AMBULATORY_CARE_PROVIDER_SITE_OTHER): Payer: Medicare Other | Admitting: Family Medicine

## 2023-01-16 VITALS — BP 134/76 | HR 73 | Ht 73.0 in | Wt 214.6 lb

## 2023-01-16 DIAGNOSIS — K219 Gastro-esophageal reflux disease without esophagitis: Secondary | ICD-10-CM

## 2023-01-16 DIAGNOSIS — I10 Essential (primary) hypertension: Secondary | ICD-10-CM | POA: Diagnosis not present

## 2023-01-16 DIAGNOSIS — F3341 Major depressive disorder, recurrent, in partial remission: Secondary | ICD-10-CM

## 2023-01-16 DIAGNOSIS — E7801 Familial hypercholesterolemia: Secondary | ICD-10-CM

## 2023-01-16 DIAGNOSIS — F419 Anxiety disorder, unspecified: Secondary | ICD-10-CM

## 2023-01-16 DIAGNOSIS — Z8659 Personal history of other mental and behavioral disorders: Secondary | ICD-10-CM

## 2023-01-16 MED ORDER — LOSARTAN POTASSIUM 100 MG PO TABS: 50.0000 mg | ORAL_TABLET | Freq: Every day | ORAL | 1 refills | Status: DC

## 2023-01-16 MED ORDER — AMLODIPINE BESYLATE 10 MG PO TABS: 10.0000 mg | ORAL_TABLET | Freq: Every day | ORAL | 1 refills | Status: DC

## 2023-01-16 MED ORDER — BUPROPION HCL 75 MG PO TABS: 75.0000 mg | ORAL_TABLET | Freq: Two times a day (BID) | ORAL | 1 refills | Status: DC

## 2023-01-16 MED ORDER — ROSUVASTATIN CALCIUM 10 MG PO TABS: 10.0000 mg | ORAL_TABLET | Freq: Every day | ORAL | 1 refills | Status: DC

## 2023-01-16 MED ORDER — PANTOPRAZOLE SODIUM 40 MG PO TBEC: 40.0000 mg | DELAYED_RELEASE_TABLET | Freq: Every day | ORAL | 1 refills | Status: DC

## 2023-01-16 MED ORDER — TRIAMTERENE-HCTZ 37.5-25 MG PO CAPS: 1.0000 | ORAL_CAPSULE | Freq: Every day | ORAL | 1 refills | Status: DC

## 2023-01-16 MED ORDER — SERTRALINE HCL 50 MG PO TABS: 50.0000 mg | ORAL_TABLET | Freq: Every day | ORAL | 1 refills | Status: DC

## 2023-01-16 NOTE — Progress Notes (Signed)
Date:  01/16/2023   Name:  Raymond [name]   DOB:  03-Jun-1955   MRN:  [id number]   Chief Complaint: Hypertension (Patient here for a medication check and refill. )  Hypertension This is a chronic problem. The problem has been gradually improving since onset. The problem is controlled. Associated symptoms include anxiety. Pertinent negatives include no blurred vision, chest pain, malaise/fatigue, neck pain, orthopnea, palpitations, peripheral edema, PND, shortness of breath or sweats. There are no associated agents to hypertension. There is no history of angina, CAD/MI or PVD. There is no history of chronic renal disease.  Hyperlipidemia This is a chronic problem. The current episode started more than 1 year ago. The problem is controlled. Recent lipid tests were reviewed and are normal. He has no history of chronic renal disease. Pertinent negatives include no chest pain or shortness of breath. Current antihyperlipidemic treatment includes statins. The current treatment provides moderate improvement of lipids. There are no compliance problems.  Risk factors for coronary artery disease include dyslipidemia and hypertension.  Depression        This is a chronic problem.  The onset quality is gradual.   The problem occurs intermittently.  The problem has been gradually improving since onset.  Associated symptoms include no decreased concentration, no fatigue, no helplessness, no hopelessness, does not have insomnia, no restlessness, no decreased interest, not sad and no suicidal ideas.     The symptoms are aggravated by medication.  Past treatments include SSRIs - Selective serotonin reuptake inhibitors and other medications.  Compliance with treatment is variable.  Previous treatment provided moderate relief.  Past medical history includes anxiety.   Anxiety Presents for follow-up visit. Patient reports no chest pain, decreased concentration, depressed mood, excessive worry, insomnia, malaise, nausea,  nervous/anxious behavior, palpitations, panic, restlessness, shortness of breath or suicidal ideas. Symptoms occur occasionally.    Gastroesophageal Reflux He reports no abdominal pain, no belching, no chest pain, no choking, no coughing, no dysphagia, no early satiety, no heartburn, no nausea, no sore throat, no stridor, no water brash or no wheezing. This is a chronic problem. The symptoms are aggravated by certain foods. Pertinent negatives include no fatigue or melena. He has tried a PPI for the symptoms. The treatment provided moderate relief.    Lab Results  Component Value Date   NA 141 07/04/2022   K 4.4 07/04/2022   CO2 24 07/04/2022   GLUCOSE 83 07/04/2022   BUN 17 07/04/2022   CREATININE 0.87 07/04/2022   CALCIUM 10.1 07/04/2022   EGFR 95 07/04/2022   GFRNONAA >60 05/03/2022   Lab Results  Component Value Date   CHOL 144 01/03/2022   HDL 40 01/03/2022   LDLCALC 75 01/03/2022   TRIG 168 (H) 01/03/2022   CHOLHDL 3.3 07/24/2016   Lab Results  Component Value Date   TSH 2.620 11/07/2018   Lab Results  Component Value Date   HGBA1C 5.7 06/19/2022   Lab Results  Component Value Date   WBC 7.8 05/03/2022   HGB 15.6 05/09/2022   HCT 46.0 05/09/2022   MCV 85.3 05/03/2022   PLT 230 05/03/2022   Lab Results  Component Value Date   ALT 22 11/08/2021   AST 18 11/08/2021   ALKPHOS 61 11/08/2021   BILITOT 0.9 11/08/2021   No results found for: "25OHVITD2", "25OHVITD3", "VD25OH"   Review of Systems  Constitutional:  Negative for fatigue, fever and malaise/fatigue.  HENT:  Negative for congestion and sore throat.   Eyes:  Negative for blurred vision.  Respiratory:  Negative for cough, choking, shortness of breath and wheezing.   Cardiovascular:  Negative for chest pain, palpitations, orthopnea and PND.  Gastrointestinal:  Negative for abdominal pain, blood in stool, constipation, diarrhea, dysphagia, heartburn, melena and nausea.  Endocrine: Negative for  polydipsia and polyuria.  Genitourinary:  Negative for difficulty urinating, dysuria and frequency.  Musculoskeletal:  Negative for neck pain.  Psychiatric/Behavioral:  Positive for depression. Negative for decreased concentration and suicidal ideas. The patient is not nervous/anxious and does not have insomnia.     Patient Active Problem List   Diagnosis Date Noted   Varicose veins of leg with pain, left 12/30/2018   Carpal tunnel syndrome, right 08/09/2018   Primary osteoarthritis of left shoulder 08/09/2018   Rotator cuff tendinitis, left 08/09/2018   Strain of left knee 08/06/2017   Obesity (BMI 35.0-39.9 without comorbidity) 08/03/2017   Status post arthroscopy of left knee 08/03/2017   Complex tear of medial meniscus of left knee as current injury 07/26/2017   Chest pain 07/24/2016   Uncontrolled type 2 diabetes mellitus without complication, without long-term current use of insulin 07/24/2016   Status post total hip replacement, left 01/25/2016   Benign essential HTN 07/21/2015   Depression 07/21/2015   Hyperlipidemia, mixed 07/21/2015   GERD (gastroesophageal reflux disease) 12/04/2014   Biceps tendinitis 07/21/2014   Impingement syndrome of shoulder 07/21/2014   Primary osteoarthritis of right knee 08/14/2013   Neuritis or radiculitis due to rupture of lumbar intervertebral disc 08/14/2013    No Known Allergies  Past Surgical History:  Procedure Laterality Date   CARPAL TUNNEL RELEASE Right 01/02/2019   Procedure: CARPAL TUNNEL RELEASE ENDOSCOPIC;  Surgeon: Christena Flake, MD;  Location: [facility];  Service: Orthopedics;  Laterality: Right;   CARPAL TUNNEL RELEASE Left 05/09/2022   Procedure: ENDOSCOPIC LEFT CARPAL TUNNEL RELEASE WITH RELEASE OF FIRST DORSAL COMPARTMENT LEFT WRIST;  Surgeon: Christena Flake, MD;  Location: [facility];  Service: Orthopedics;  Laterality: Left;   CHOLECYSTECTOMY  1988   CHONDROPLASTY Right 10/05/2009   knee- shaving patellofemoral joint    COLONOSCOPY  2010   COLONOSCOPY WITH PROPOFOL N/A 10/12/2021   Procedure: COLONOSCOPY WITH PROPOFOL;  Surgeon: Earline Mayotte, MD;  Location: [facility];  Service: Endoscopy;  Laterality: N/A;   ENDOVENOUS ABLATION SAPHENOUS VEIN W/ LASER     INJECTION KNEE Left 12/23/2019   Procedure: KNEE INJECTION;  Surgeon: Christena Flake, MD;  Location: [facility];  Service: Orthopedics;  Laterality: Left;   KNEE ARTHROSCOPY Left 07/25/2017   Procedure: Arthroscopic partial medial meniscectomy; arthroscopic debridement; and arthroscopic abrasion chondroplasty of medial femoral condyle, lateral tibial plateau, and femoral trochlea; left knee.;  Surgeon: Christena Flake, MD;  Location: [facility];  Service: Orthopedics;  Laterality: Left;   KNEE ARTHROSCOPY WITH MEDIAL MENISECTOMY Right    Partial medial and lateral menisectomy   REVERSE SHOULDER ARTHROPLASTY Right 11/15/2021   Procedure: ANATOMIC  SHOULDER ARTHROPLASTY WITH BICEPS TENODESIS;  Surgeon: Christena Flake, MD;  Location: [facility];  Service: Orthopedics;  Laterality: Right;   SHOULDER ARTHROSCOPY Left 08/10/2014   Procedure: ARTHROSCOPY SHOULDER WITH LIMITED DEBRIDEMENT, REMOVAL OF LOOSE BODY AND RELEASE OF LONG END BICEPS TENDON;  Surgeon: Erin Sons, MD;  Location: [facility];  Service: Orthopedics;  Laterality: Left;   TOTAL HIP ARTHROPLASTY Left 01/25/2016   Procedure: TOTAL HIP ARTHROPLASTY;  Surgeon: Christena Flake, MD;  Location: [facility];  Service: Orthopedics;  Laterality: Left;  TOTAL KNEE ARTHROPLASTY Right 12/23/2019   Procedure: TOTAL KNEE ARTHROPLASTY;  Surgeon: Christena Flake, MD;  Location: [facility];  Service: Orthopedics;  Laterality: Right;   TOTAL KNEE ARTHROPLASTY Left 11/11/2020   Procedure: TOTAL KNEE ARTHROPLASTY;  Surgeon: Christena Flake, MD;  Location: [facility];  Service: Orthopedics;  Laterality: Left;    Social History   Tobacco Use   Smoking status: Never   Smokeless tobacco: Never  Vaping Use    Vaping status: Never Used  Substance Use Topics   Alcohol use: Yes    Comment: rare   Drug use: No     Medication list has been reviewed and updated.  Current Meds  Medication Sig   acetaminophen (TYLENOL) 325 MG tablet Take 650 mg by mouth every 6 (six) hours as needed.   amLODipine (NORVASC) 10 MG tablet Take 1 tablet (10 mg total) by mouth daily.   aspirin EC 81 MG tablet Take 81 mg by mouth daily. Swallow whole.   buPROPion (WELLBUTRIN) 75 MG tablet Take 1 tablet (75 mg total) by mouth 2 (two) times daily.   cyclobenzaprine (FLEXERIL) 10 MG tablet Take 10 mg by mouth 2 (two) times daily as needed.   gabapentin (NEURONTIN) 300 MG capsule Take 3 capsules (900 mg total) by mouth 3 (three) times daily.   glipiZIDE (GLUCOTROL) 5 MG tablet Take 1 tablet (5 mg total) by mouth 2 (two) times daily before a meal. (Patient taking differently: Take 5 mg by mouth 1 day or 1 dose.)   HYDROcodone-acetaminophen (NORCO/VICODIN) 5-325 MG tablet Take 1-2 tablets by mouth every 6 (six) hours as needed for moderate pain or severe pain.   [START ON 01/21/2023] losartan (COZAAR) 100 MG tablet Take 0.5 tablets (50 mg total) by mouth daily.   metFORMIN (GLUCOPHAGE) 500 MG tablet Take 2 tablets (1,000 mg total) by mouth 2 (two) times daily with a meal.   pantoprazole (PROTONIX) 40 MG tablet Take 1 tablet (40 mg total) by mouth daily.   rosuvastatin (CRESTOR) 10 MG tablet Take 1 tablet (10 mg total) by mouth daily.   Semaglutide,0.25 or 0.5MG /DOS, 2 MG/3ML SOPN Inject into the skin.   sertraline (ZOLOFT) 50 MG tablet Take 1 tablet (50 mg total) by mouth daily.       01/16/2023   11:25 AM 07/04/2022   10:39 AM 01/03/2022   10:30 AM 07/19/2021    9:16 AM  GAD 7 : Generalized Anxiety Score  Nervous, Anxious, on Edge 0 0 0 0  Control/stop worrying 0 0 0 0  Worry too much - different things 0 0 0 0  Trouble relaxing 0 0 0 0  Restless 0 0 0 0  Easily annoyed or irritable 0 0 0 0  Afraid - awful might  happen 0 0 0 0  Total GAD 7 Score 0 0 0 0  Anxiety Difficulty Not difficult at all Not difficult at all Not difficult at all Not difficult at all       01/16/2023   11:24 AM 07/04/2022   10:39 AM 01/03/2022   10:30 AM  Depression screen PHQ 2/9  Decreased Interest 0 0 0  Down, Depressed, Hopeless 0 0 0  PHQ - 2 Score 0 0 0  Altered sleeping 0 0 0  Tired, decreased energy 0 0 0  Change in appetite 0 0 0  Feeling bad or failure about yourself  0 0 0  Trouble concentrating 0 0 0  Moving slowly or fidgety/restless 0 0 0  Suicidal thoughts 0 0 0  PHQ-9 Score 0 0 0  Difficult doing work/chores Not difficult at all Not difficult at all Not difficult at all    BP Readings from Last 3 Encounters:  01/16/23 134/76  07/04/22 128/74  05/09/22 112/71    Physical Exam Vitals and nursing note reviewed.  HENT:     Head: Normocephalic.     Right Ear: Tympanic membrane and external ear normal.     Left Ear: Tympanic membrane and external ear normal.     Nose: Nose normal. No congestion or rhinorrhea.     Mouth/Throat:     Mouth: Mucous membranes are moist.     Pharynx: No oropharyngeal exudate or posterior oropharyngeal erythema.  Eyes:     General: No scleral icterus.       Right eye: No discharge.        Left eye: No discharge.     Conjunctiva/sclera: Conjunctivae normal.     Pupils: Pupils are equal, round, and reactive to light.  Neck:     Thyroid: No thyromegaly.     Vascular: No JVD.     Trachea: No tracheal deviation.  Cardiovascular:     Rate and Rhythm: Normal rate and regular rhythm.     Heart sounds: Normal heart sounds. No murmur heard.    No friction rub. No gallop.  Pulmonary:     Effort: No respiratory distress.     Breath sounds: Normal breath sounds. No stridor. No wheezing, rhonchi or rales.  Chest:     Chest wall: No tenderness.  Abdominal:     General: Bowel sounds are normal.     Palpations: Abdomen is soft. There is no mass.     Tenderness: There is  no abdominal tenderness. There is no guarding or rebound.  Musculoskeletal:        General: No tenderness. Normal range of motion.     Cervical back: Normal range of motion and neck supple.  Lymphadenopathy:     Cervical: No cervical adenopathy.  Skin:    General: Skin is warm.     Findings: No rash.  Neurological:     Mental Status: He is alert and oriented to person, place, and time.     Cranial Nerves: No cranial nerve deficit.     Deep Tendon Reflexes: Reflexes are normal and symmetric.    Wt Readings from Last 3 Encounters:  01/16/23 214 lb 9.6 oz (97.3 kg)  07/04/22 226 lb (102.5 kg)  05/09/22 240 lb (108.9 kg)    BP 134/76   Pulse 73   Ht 6\' 1"  (1.854 m)   Wt 214 lb 9.6 oz (97.3 kg)   SpO2 99%   BMI 28.31 kg/m   Assessment and Plan:  1. Essential hypertension Chronic.  Controlled.  Stable.  Blood pressure 134/76.  Asymptomatic.  Tolerating medication well.  Continue amlodipine 10 mg once a day, losartan 100 mg once a day and triamterene hydrochlorothiazide 37.5-25 mg once a day.  Will check renal function panel for electrolytes and GFR.  Will recheck patient in 6 months. - Renal Function Panel - amLODipine (NORVASC) 10 MG tablet; Take 1 tablet (10 mg total) by mouth daily.  Dispense: 90 tablet; Refill: 1 - losartan (COZAAR) 100 MG tablet; Take 0.5 tablets (50 mg total) by mouth daily.  Dispense: 45 tablet; Refill: 1 - triamterene-hydrochlorothiazide (DYAZIDE) 37.5-25 MG capsule; Take 1 each (1 capsule total) by mouth daily.  Dispense: 90 capsule; Refill: 1  2. Gastroesophageal  reflux disease, unspecified whether esophagitis present Chronic.  Controlled.  Stable.  Asymptomatic and tolerating current dosing of Protonix 40 mg daily.  Will continue at current dosing and recheck in 6 months. - pantoprazole (PROTONIX) 40 MG tablet; Take 1 tablet (40 mg total) by mouth daily.  Dispense: 90 tablet; Refill: 1  3. Familial hypercholesterolemia Chronic.  Controlled.  Stable.   Asymptomatic.  Without muscle weakness or myalgias.  Continue rosuvastatin 10 mg once a day.  Will check lipid panel for current LDL control.. - rosuvastatin (CRESTOR) 10 MG tablet; Take 1 tablet (10 mg total) by mouth daily.  Dispense: 90 tablet; Refill: 1 - Lipid Panel With LDL/HDL Ratio  4. Recurrent major depressive disorder, in partial remission (HCC) Chronic.  Controlled.  Stable.  PHQ is 0.  Continue combination of sertraline 50 mg once a day and bupropion 75 mg once every 12 hours.  Will recheck patient in 6 months. - sertraline (ZOLOFT) 50 MG tablet; Take 1 tablet (50 mg total) by mouth daily.  Dispense: 90 tablet; Refill: 1 - buPROPion (WELLBUTRIN) 75 MG tablet; Take 1 tablet (75 mg total) by mouth 2 (two) times daily.  Dispense: 180 tablet; Refill: 1  5. Anxiety Chronic.  Controlled.  Stable.  GAD score 0.  Continue sertraline 50 mg once a day. - sertraline (ZOLOFT) 50 MG tablet; Take 1 tablet (50 mg total) by mouth daily.  Dispense: 90 tablet; Refill: 1  6. History of posttraumatic stress disorder (PTSD) Patient with history of posttraumatic stress disorder currently is controlled on sertraline 50 mg daily. - sertraline (ZOLOFT) 50 MG tablet; Take 1 tablet (50 mg total) by mouth daily.  Dispense: 90 tablet; Refill: 1    Elizabeth Sauer, MD

## 2023-01-17 ENCOUNTER — Encounter: Payer: Self-pay | Admitting: Family Medicine

## 2023-01-17 LAB — LIPID PANEL WITH LDL/HDL RATIO
Cholesterol, Total: 145 mg/dL (ref 100–199)
HDL: 54 mg/dL (ref >39)
LDL Chol Calc (NIH): 78 mg/dL (ref 0–99)
LDL/HDL Ratio: 1.4 {ratio} (ref 0.0–3.6)
Triglycerides: 67 mg/dL (ref 0–149)
VLDL Cholesterol Cal: 13 mg/dL (ref 5–40)

## 2023-01-17 LAB — RENAL FUNCTION PANEL
Albumin: 4.4 g/dL (ref 3.9–4.9)
BUN/Creatinine Ratio: 20 (ref 10–24)
BUN: 17 mg/dL (ref 8–27)
CO2: 25 mmol/L (ref 20–29)
Calcium: 9.8 mg/dL (ref 8.6–10.2)
Chloride: 100 mmol/L (ref 96–106)
Creatinine, Ser: 0.87 mg/dL (ref 0.76–1.27)
Glucose: 77 mg/dL (ref 70–99)
Phosphorus: 3.7 mg/dL (ref 2.8–4.1)
Potassium: 3.8 mmol/L (ref 3.5–5.2)
Sodium: 140 mmol/L (ref 134–144)
eGFR: 94 mL/min/{1.73_m2} (ref >59)

## 2023-01-25 ENCOUNTER — Encounter: Payer: Self-pay | Admitting: Pharmacist

## 2023-01-25 IMAGING — DX DG KNEE 1-2V PORT*L*
1 series · 2 of 2 positions shown · non-contrast
Comparison: None.

CLINICAL DATA: Status post left knee replacement.

EXAM:
PORTABLE LEFT KNEE - 1-2 VIEW

[Series 1: knee lat · 0.14mm/px · 2 of 2 slices shown]
[im 1/2]
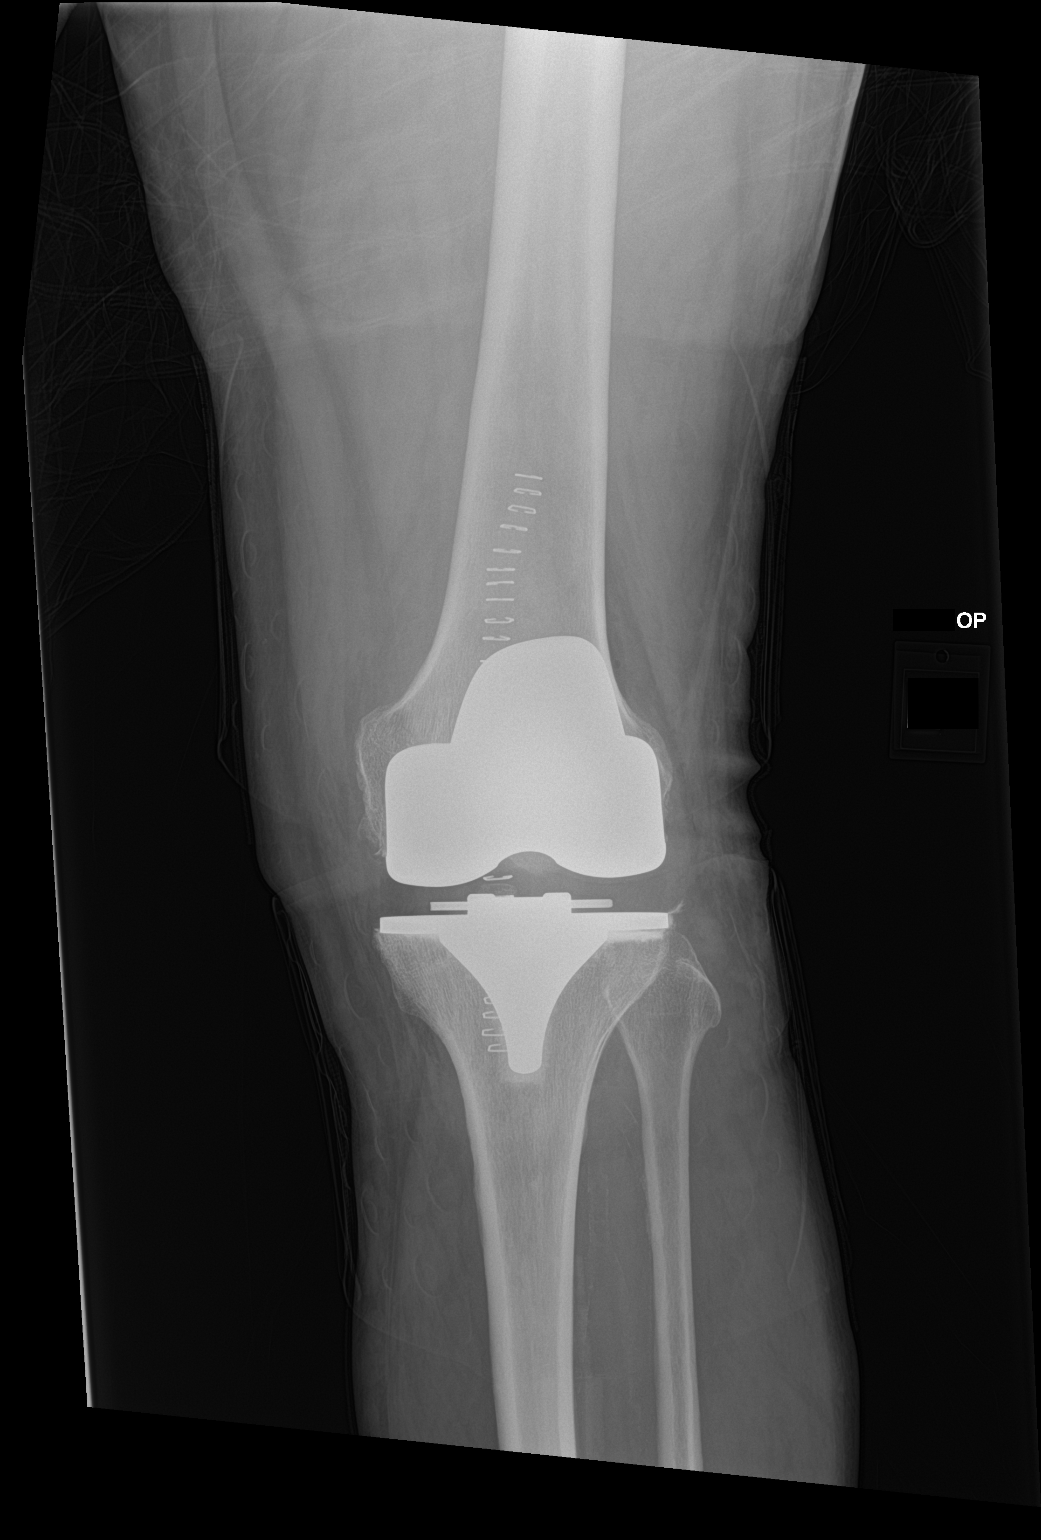
[im 2/2]
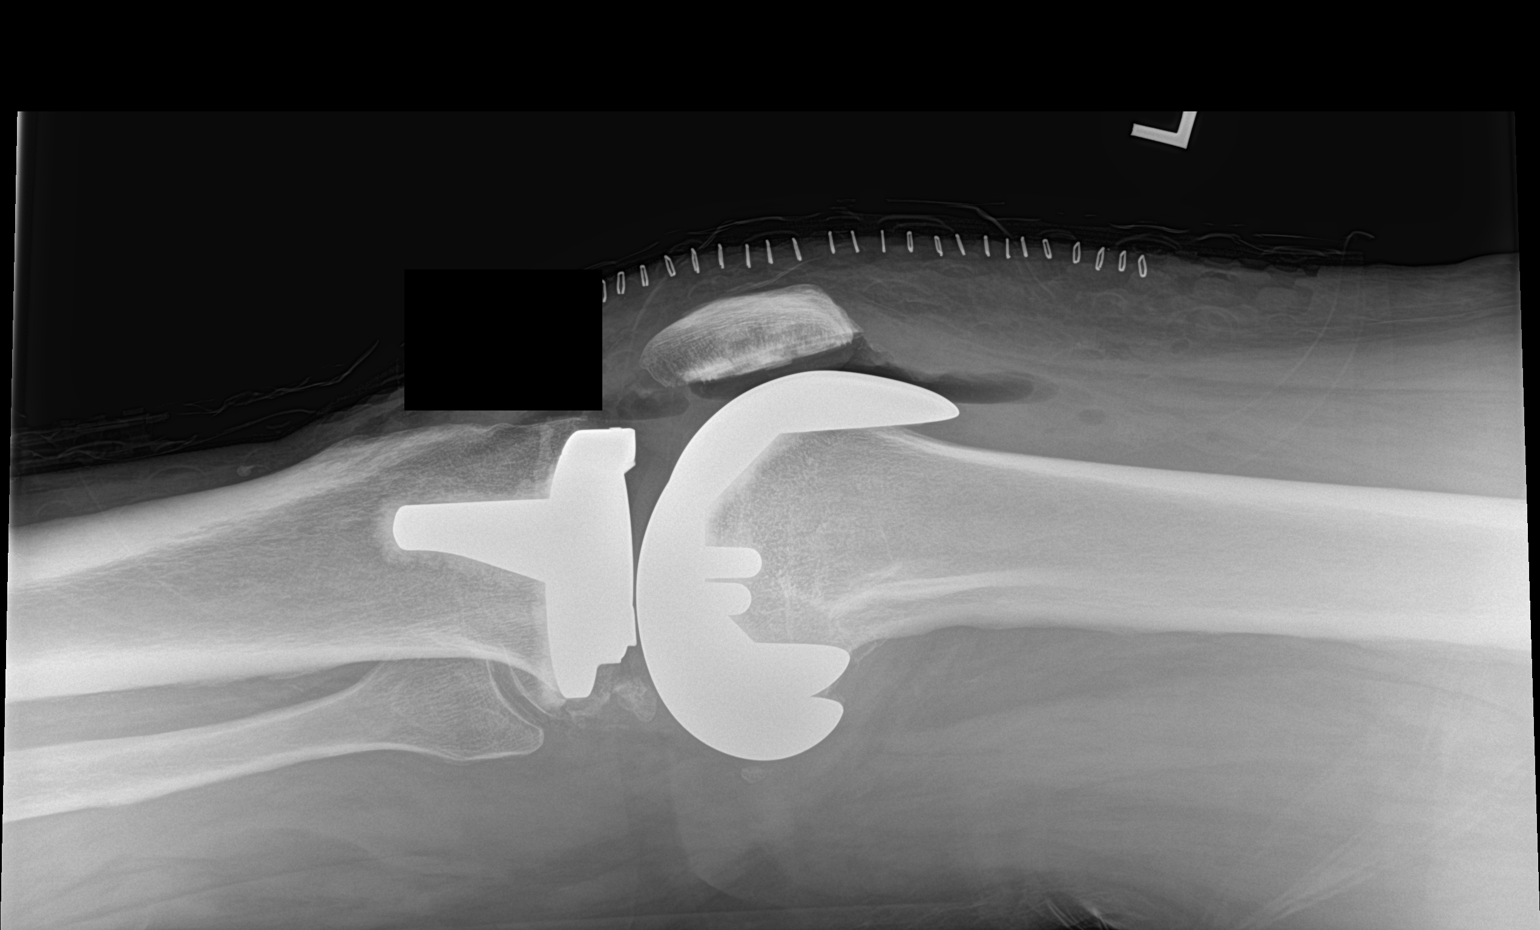

[2 of 2 positions shown; findings below may reference images not displayed]

FINDINGS: Left knee arthroplasty in expected alignment. No periprosthetic
lucency or fracture. There has been patellar resurfacing. Recent
postsurgical change includes air and edema in the soft tissues and
joint space. Anterior skin staples.
IMPRESSION: Left knee arthroplasty without immediate postoperative complication.

## 2023-01-25 NOTE — Progress Notes (Signed)
Pharmacy Quality Measure Review  This patient is appearing on a report for being at risk of failing the adherence measure for cholesterol (statin) medications this calendar year.   Medication: rosuvastatin 10 mg daily Last fill date: 11/20 for 90 day supply  Insurance report was not up to date. No action needed at this time.

## 2023-01-30 ENCOUNTER — Ambulatory Visit
Admission: EM | Admit: 2023-01-30 | Discharge: 2023-01-30 | Discharge status: Against Medical Advice | Payer: Medicare Other

## 2023-01-30 DIAGNOSIS — W19XXXA Unspecified fall, initial encounter: Secondary | ICD-10-CM | POA: Diagnosis not present

## 2023-01-30 DIAGNOSIS — M546 Pain in thoracic spine: Secondary | ICD-10-CM

## 2023-01-30 NOTE — ED Triage Notes (Addendum)
Wife states that patient called her about 89 today and stated that a horse knocked him down. When she got there the right side of his body was covered in mud. Patient went home and kept asking wife if they put all of their horses up. Patient doesn't remember falling or getting knocked down by horse. Patient states that the top of is shoulder is sore. Patient is able to name his medications off that he takes. Wife likes to talk for husband.

## 2023-01-30 NOTE — Discharge Instructions (Addendum)
Please go to the emergency department at [facility] to be evaluated following your accident with being knocked over by her horse.  There is concern for concussion given your perseveration and amnesia of events.  Given your age you need a CT scan of your head which we are unable to provide here at the urgent care today.

## 2023-01-30 NOTE — ED Notes (Signed)
Patient is being discharged from the Urgent Care and sent to the Emergency Department via personal vehicle . Per Berneta Sages NP, patient is in need of higher level of care due to Fall/Memory Loss. Patient is aware and verbalizes understanding of plan of care.  Vitals:   01/30/23 1955  BP: 115/71  Pulse: 76  Resp: 18  Temp: 98.8 F (37.1 C)  SpO2: 97%

## 2023-01-30 NOTE — ED Provider Notes (Signed)
[facility]    CSN: [id number] Arrival date & time: 01/30/23  1940      History   Chief Complaint Chief Complaint  Patient presents with   Fall   Memory Loss    HPI Raymond [name] is a 67 y.o. male.   HPI  67 year old male with a past medical history significant for GERD, benign hypertension, hyperlipidemia, and type 2 diabetes presents for evaluation of shoulder pain.  He is with his wife who reports that she was called at around 340 this afternoon by the patient to indicate that he had been knocked over by one of their horses.  When she arrived she found him covered in mud on the right side of his body and the wife reports that he has been repeatedly asking her if they put all the horses up.  She also indicates that he does not remember getting knocked down.  The patient was unable to remember what medications he takes at triage.  Patient is not sure if he was knocked out or not in the incident was unwitnessed.  He denies any numbness, tingling, or weakness in any of his extremities he denies any nausea or vomiting.  He does report that he is starting to develop a frontal headache.  Patient takes 81 mg of aspirin daily.  Past Medical History:  Diagnosis Date   Allergy    Anginal pain (HCC)    Arthritis    Carotid stenosis    a.) Carotid doppler 03/26/2019: 40-59% RICA, 1-39% LICA   Carpal tunnel syndrome, bilateral    De Quervain's tenosynovitis    Degenerative cervical spinal stenosis    Depression    Diastolic dysfunction    a.) TTE on 07/17/2019 --> EF >55%; mild panvalvular regurgitation; G1DD.   GERD (gastroesophageal reflux disease)    Heart murmur    Hyperlipidemia    Hypertension    Lumbar radiculitis    Mild intermittent asthma    OSA on CPAP    Osteoarthritis    PVD (peripheral vascular disease) (HCC)    Stroke (HCC)     no residual effects   T2DM (type 2 diabetes mellitus) (HCC)    Valvular regurgitation    a.) TTE on 07/17/2019 --> trival  PR; mild AR, MR, TR   Varicose vein     Patient Active Problem List   Diagnosis Date Noted   Varicose veins of leg with pain, left 12/30/2018   Carpal tunnel syndrome, right 08/09/2018   Primary osteoarthritis of left shoulder 08/09/2018   Rotator cuff tendinitis, left 08/09/2018   Strain of left knee 08/06/2017   Obesity (BMI 35.0-39.9 without comorbidity) 08/03/2017   Status post arthroscopy of left knee 08/03/2017   Complex tear of medial meniscus of left knee as current injury 07/26/2017   Chest pain 07/24/2016   Uncontrolled type 2 diabetes mellitus without complication, without long-term current use of insulin 07/24/2016   Status post total hip replacement, left 01/25/2016   Benign essential HTN 07/21/2015   Depression 07/21/2015   Hyperlipidemia, mixed 07/21/2015   GERD (gastroesophageal reflux disease) 12/04/2014   Biceps tendinitis 07/21/2014   Impingement syndrome of shoulder 07/21/2014   Primary osteoarthritis of right knee 08/14/2013   Neuritis or radiculitis due to rupture of lumbar intervertebral disc 08/14/2013    Past Surgical History:  Procedure Laterality Date   CARPAL TUNNEL RELEASE Right 01/02/2019   Procedure: CARPAL TUNNEL RELEASE ENDOSCOPIC;  Surgeon: Christena Flake, MD;  Location: [facility];  Service: Orthopedics;  Laterality: Right;   CARPAL TUNNEL RELEASE Left 05/09/2022   Procedure: ENDOSCOPIC LEFT CARPAL TUNNEL RELEASE WITH RELEASE OF FIRST DORSAL COMPARTMENT LEFT WRIST;  Surgeon: Christena Flake, MD;  Location: [facility];  Service: Orthopedics;  Laterality: Left;   CHOLECYSTECTOMY  1988   CHONDROPLASTY Right 10/05/2009   knee- shaving patellofemoral joint   COLONOSCOPY  2010   COLONOSCOPY WITH PROPOFOL N/A 10/12/2021   Procedure: COLONOSCOPY WITH PROPOFOL;  Surgeon: Earline Mayotte, MD;  Location: [facility];  Service: Endoscopy;  Laterality: N/A;   ENDOVENOUS ABLATION SAPHENOUS VEIN W/ LASER     INJECTION KNEE Left 12/23/2019   Procedure: KNEE  INJECTION;  Surgeon: Christena Flake, MD;  Location: [facility];  Service: Orthopedics;  Laterality: Left;   KNEE ARTHROSCOPY Left 07/25/2017   Procedure: Arthroscopic partial medial meniscectomy; arthroscopic debridement; and arthroscopic abrasion chondroplasty of medial femoral condyle, lateral tibial plateau, and femoral trochlea; left knee.;  Surgeon: Christena Flake, MD;  Location: [facility];  Service: Orthopedics;  Laterality: Left;   KNEE ARTHROSCOPY WITH MEDIAL MENISECTOMY Right    Partial medial and lateral menisectomy   REVERSE SHOULDER ARTHROPLASTY Right 11/15/2021   Procedure: ANATOMIC  SHOULDER ARTHROPLASTY WITH BICEPS TENODESIS;  Surgeon: Christena Flake, MD;  Location: [facility];  Service: Orthopedics;  Laterality: Right;   SHOULDER ARTHROSCOPY Left 08/10/2014   Procedure: ARTHROSCOPY SHOULDER WITH LIMITED DEBRIDEMENT, REMOVAL OF LOOSE BODY AND RELEASE OF LONG END BICEPS TENDON;  Surgeon: Erin Sons, MD;  Location: [facility];  Service: Orthopedics;  Laterality: Left;   TOTAL HIP ARTHROPLASTY Left 01/25/2016   Procedure: TOTAL HIP ARTHROPLASTY;  Surgeon: Christena Flake, MD;  Location: [facility];  Service: Orthopedics;  Laterality: Left;   TOTAL KNEE ARTHROPLASTY Right 12/23/2019   Procedure: TOTAL KNEE ARTHROPLASTY;  Surgeon: Christena Flake, MD;  Location: [facility];  Service: Orthopedics;  Laterality: Right;   TOTAL KNEE ARTHROPLASTY Left 11/11/2020   Procedure: TOTAL KNEE ARTHROPLASTY;  Surgeon: Christena Flake, MD;  Location: [facility];  Service: Orthopedics;  Laterality: Left;       Home Medications    Prior to Admission medications   Medication Sig Start Date End Date Taking? Authorizing Provider  amLODipine (NORVASC) 10 MG tablet Take 1 tablet (10 mg total) by mouth daily. 01/16/23  Yes Duanne Limerick, MD  aspirin EC 81 MG tablet Take 81 mg by mouth daily. Swallow whole.   Yes [provider]  buPROPion (WELLBUTRIN) 75 MG tablet Take 1 tablet (75 mg  total) by mouth 2 (two) times daily. 01/16/23  Yes Duanne Limerick, MD  glipiZIDE (GLUCOTROL) 5 MG tablet Take 1 tablet (5 mg total) by mouth 2 (two) times daily before a meal. Patient taking differently: Take 5 mg by mouth 1 day or 1 dose. 07/28/22  Yes Duanne Limerick, MD  glucosamine-chondroitin 500-400 MG tablet Take 2 tablets by mouth daily.   Yes [provider]  losartan (COZAAR) 100 MG tablet Take 0.5 tablets (50 mg total) by mouth daily. 01/21/23  Yes Duanne Limerick, MD  metFORMIN (GLUCOPHAGE) 500 MG tablet Take 2 tablets (1,000 mg total) by mouth 2 (two) times daily with a meal. 07/28/22  Yes Duanne Limerick, MD  pantoprazole (PROTONIX) 40 MG tablet Take 1 tablet (40 mg total) by mouth daily. 01/16/23  Yes Duanne Limerick, MD  rosuvastatin (CRESTOR) 10 MG tablet Take 1 tablet (10 mg total) by mouth daily. 01/16/23  Yes Yetta Barre,  Vanita Panda, MD  Semaglutide,0.25 or 0.5MG /DOS, 2 MG/3ML SOPN Inject into the skin. 04/27/22  Yes [provider]  sertraline (ZOLOFT) 50 MG tablet Take 1 tablet (50 mg total) by mouth daily. 01/16/23  Yes Duanne Limerick, MD  triamterene-hydrochlorothiazide (DYAZIDE) 37.5-25 MG capsule Take 1 each (1 capsule total) by mouth daily. 01/16/23  Yes Duanne Limerick, MD  ACCU-CHEK AVIVA PLUS test strip TEST AS DIRECTED Patient not taking: Reported on 01/16/2023 11/27/16   Duanne Limerick, MD  acetaminophen (TYLENOL) 325 MG tablet Take 650 mg by mouth every 6 (six) hours as needed.    [provider]  cyclobenzaprine (FLEXERIL) 10 MG tablet Take 10 mg by mouth 2 (two) times daily as needed.    [provider]  gabapentin (NEURONTIN) 300 MG capsule Take 3 capsules (900 mg total) by mouth 3 (three) times daily. 11/15/21   Poggi, Excell Seltzer, MD  HYDROcodone-acetaminophen (NORCO/VICODIN) 5-325 MG tablet Take 1-2 tablets by mouth every 6 (six) hours as needed for moderate pain or severe pain. 05/09/22 05/09/23  Poggi, Excell Seltzer, MD    Family History History  reviewed. No pertinent family history.  Social History Social History   Tobacco Use   Smoking status: Never   Smokeless tobacco: Never  Vaping Use   Vaping status: Never Used  Substance Use Topics   Alcohol use: Yes    Comment: rare   Drug use: No     Allergies   Patient has no known allergies.   Review of Systems Review of Systems  Eyes:  Negative for visual disturbance.  Gastrointestinal:  Negative for nausea and vomiting.  Musculoskeletal:  Positive for back pain.  Neurological:  Positive for headaches.       Amnesia to event     Physical Exam Triage Vital Signs ED Triage Vitals  Encounter Vitals Group     BP      Systolic BP Percentile      Diastolic BP Percentile      Pulse      Resp      Temp      Temp src      SpO2      Weight      Height      Head Circumference      Peak Flow      Pain Score      Pain Loc      Pain Education      Exclude from Growth Chart    No data found.  Updated Vital Signs BP 115/71 (BP Location: Left Arm)   Pulse 76   Temp 98.8 F (37.1 C) (Oral)   Resp 18   SpO2 97%   Visual Acuity Right Eye Distance:   Left Eye Distance:   Bilateral Distance:    Right Eye Near:   Left Eye Near:    Bilateral Near:     Physical Exam Vitals and nursing note reviewed.  Constitutional:      Appearance: Normal appearance. He is not ill-appearing.  HENT:     Head: Normocephalic and atraumatic.  Eyes:     General: No scleral icterus.    Extraocular Movements: Extraocular movements intact.     Conjunctiva/sclera: Conjunctivae normal.     Pupils: Pupils are equal, round, and reactive to light.  Cardiovascular:     Rate and Rhythm: Normal rate and regular rhythm.     Pulses: Normal pulses.     Heart sounds: Normal heart sounds. No murmur  heard.    No gallop.  Pulmonary:     Effort: Pulmonary effort is normal.     Breath sounds: Normal breath sounds. No wheezing, rhonchi or rales.  Musculoskeletal:        General:  Tenderness present. No signs of injury.  Skin:    General: Skin is warm and dry.     Capillary Refill: Capillary refill takes less than 2 seconds.  Neurological:     General: No focal deficit present.     Mental Status: He is alert and oriented to person, place, and time.     Cranial Nerves: No cranial nerve deficit.     Motor: No weakness.     Coordination: Coordination normal.      UC Treatments / Results  Labs (all labs ordered are listed, but only abnormal results are displayed) Labs Reviewed - No data to display  EKG   Radiology No results found.  Procedures Procedures (including critical care time)  Medications Ordered in UC Medications - No data to display  Initial Impression / Assessment and Plan / UC Course  I have reviewed the triage vital signs and the nursing notes.  Pertinent labs & imaging results that were available during my care of the patient were reviewed by me and considered in my medical decision making (see chart for details).   Patient is a pleasant, nontoxic-appearing 67 year old male presenting for evaluation of pain in the middle of his back out towards the shoulder blades bilaterally after being knocked over by a horse this afternoon.  The event was unwitnessed and it is unclear if the patient struck his head.  Per wife's report patient has been perseverating regarding whether or not they put all the horses up and does not remember being knocked over by the horse.  He was also unable to remember his medications at triage.  Physical exam reveals cranial nerves II through XII intact.  Patient's bilateral grips and upper extremity strength are 5/5 in bilateral lower extremity strength are all 5/5.  Cardiopulmonary exam is benign.  Patient has no midline spinous process tenderness or step-off in his thoracic spine and he does have mild tenderness to the muscles of the thoracic paraspinous region bilaterally without any overt spasm.  Given the unwitnessed  nature of the fall, coupled with the perseveration and the patient's age, I feel that he needs a CT scan of his head.  I have discussed this with the patient and his wife and they are agreeable and they will go to [facility] via POV for further evaluation.   Final Clinical Impressions(s) / UC Diagnoses   Final diagnoses:  Fall, initial encounter  Acute midline thoracic back pain     Discharge Instructions      Please go to the emergency department at [facility] to be evaluated following your accident with being knocked over by her horse.  There is concern for concussion given your perseveration and amnesia of events.  Given your age you need a CT scan of your head which we are unable to provide here at the urgent care today.     ED Prescriptions   None    PDMP not reviewed this encounter.   Becky Augusta, NP 01/30/23 2024

## 2023-01-31 ENCOUNTER — Encounter: Payer: Self-pay | Admitting: Internal Medicine

## 2023-01-31 ENCOUNTER — Ambulatory Visit
Admission: RE | Admit: 2023-01-31 | Discharge: 2023-01-31 | Disposition: A | Discharge status: Home / Self Care | Payer: Medicare Other | Source: Ambulatory Visit | Attending: Internal Medicine | Admitting: Internal Medicine

## 2023-01-31 ENCOUNTER — Ambulatory Visit: Payer: Self-pay | Admitting: *Deleted

## 2023-01-31 ENCOUNTER — Ambulatory Visit: Payer: Medicare Other | Admitting: Internal Medicine

## 2023-01-31 ENCOUNTER — Ambulatory Visit: Payer: Self-pay

## 2023-01-31 VITALS — BP 112/74 | HR 75 | Ht 73.0 in | Wt 212.0 lb

## 2023-01-31 DIAGNOSIS — S0990XA Unspecified injury of head, initial encounter: Secondary | ICD-10-CM | POA: Diagnosis not present

## 2023-01-31 DIAGNOSIS — F4489 Other dissociative and conversion disorders: Secondary | ICD-10-CM

## 2023-01-31 DIAGNOSIS — R519 Headache, unspecified: Secondary | ICD-10-CM

## 2023-01-31 DIAGNOSIS — W19XXXA Unspecified fall, initial encounter: Secondary | ICD-10-CM | POA: Insufficient documentation

## 2023-01-31 NOTE — Telephone Encounter (Addendum)
  Chief Complaint: fall Symptoms: knocked over by horse, pt fell, unsure how long LOC, had confusion last night  Frequency: yesterday evening  Pertinent Negatives: NA Disposition: [] ED /[] Urgent Care (no appt availability in office) / [x] Appointment(In office/virtual)/ []  [facility]/ [] Home Care/ [] Refused Recommended Disposition /[] [place] [facility]/ []  Follow-up with PCP Additional Notes: pt's wife Nicholos Johns called, states pt had fall yesterday d/t horse knocked him over and pt went to UC last night, they sent him to [facility] ED, too long of wait so pt left. pt needing referral for CT scan by PCP. She preferred him to be seen by office today rather than going back to ED. PCP OOO today, scheduled OV at 1120 with Dr. Judithann Graves.   Reason for Disposition  [1] Caller has URGENT question AND [2] triager unable to answer question  Answer Assessment - Initial Assessment Questions 1. MECHANISM: "How did the fall happen?"     Knocked over by horse and unsure if had LOC 3. ONSET: "When did the fall happen?" (e.g., minutes, hours, or days ago)     yesterday 4. LOCATION: "What part of the body hit the ground?" (e.g., back, buttocks, head, hips, knees, hands, head, stomach)     Unsure pt was by self and doesn't exactly remember 6. PAIN: "Is there any pain?" If Yes, ask: "How bad is the pain?" (e.g., Scale 1-10; or mild,  moderate, severe)   - NONE (0): No pain   - MILD (1-3): Doesn't interfere with normal activities    - MODERATE (4-7): Interferes with normal activities or awakens from sleep    - SEVERE (8-10): Excruciating pain, unable to do any normal activities      No c/o pain 9. OTHER SYMPTOMS: "Do you have any other symptoms?" (e.g., dizziness, fever, weakness; new onset or worsening).      Confusion at time last night  10. CAUSE: "What do you think caused the fall (or falling)?" (e.g., tripped, dizzy spell)       Knocked over by horse  Protocols used: Falls and  [facility]

## 2023-01-31 NOTE — Telephone Encounter (Signed)
Reason for Disposition  Can't remember what happened (amnesia)  Answer Assessment - Initial Assessment Questions 1. MECHANISM: "How did the injury happen?" For falls, ask: "What height did you fall from?" and "What surface did you fall against?"      Daughter Raymond [name] calling [name].  I asked if I could speak with her father and she said,   "He is [name]".   I asked if he could come to the phone so I could ask some triage questions to see what kind of care he would need.     Raymond [name] is not on the [facility] and admitted she wasn't but went on to tell me that he went to the urgent care last night and saw a NP.  He has a concussion.  They told him he needs a CT scan but they don't do those at urgent cares.  Her father does not remember what happened or how it happened.   I asked did she know what happened.   Was he [name] an accident or what? 2. ONSET: "When did the injury happen?" (Minutes or hours ago)      He can't remember what happened or how it happened.   His jeans and all were covered [name] mud.   He can't tell us anything.   He drove to the ED at [facility] but it was so crowded that he left. He knows he was bringing my horse out and putting a holter on him and he doesn't remember anything else after that.  He did call my Mom to come out there.   When she got out there he had already put the horse up.    He keeps asking my Mom  "Did you put the horses up?" I asked Raymond [name] if he was on blood thinners.   He is on aspirin, I think.  "He has had a stroke before that was found on a scan when they were looking for something else.   We didn't know he had had a stroke until then. I asked if he had any other injuries.  He is just sore.    I'm an MRI tech and I was wondering could he go get a C scan.   I let her know that due to his symptoms he needed to go to the ED.   He did need to have a CT scan or MRI if the provider [name] the ED felt that was needed which sounds like it is.  Raymond [name] asked,    "Can't I just take him to an imaging place and get him a scan?"    I let her know those need to be scheduled and since he had an accident he can't remember, and is repeating things and is on aspirin daily he really needs to be evaluated [name] the ED.    She was finally agreeable but mentioned he was being stubborn and not sure if he would go or not.   I emphasized that he really needed to be evaluated.  "I'll see if I can get him to go". 3. NEUROLOGIC SYMPTOMS: "Was there any loss of consciousness?" "Are there any other neurological symptoms?"      He doesn't remember what happened and he keeps asking my Mom if she put the horses up.    4. MENTAL STATUS: "Does the person know who they are, who you are, and where they are?"      Yes he is out [name]  now.   He doesn't remember the accident at all. 5. LOCATION: "What part of the head was hit?"      Not sure if he even hit his head per Raymond [name]. 6. SCALP APPEARANCE: "What does the scalp look like? Is it bleeding now?" If Yes, ask: "Is it difficult to stop?"      Doesn't know 7. SIZE: For cuts, bruises, or swelling, ask: "How large is it?" (e.g., inches or centimeters)      Doesn't know 8. PAIN: "Is there any pain?" If Yes, ask: "How bad is it?"  (e.g., Scale 1-10; or mild, moderate, severe)     Unknown because her father was [name]. 9. TETANUS: For any breaks [name] the skin, ask: "When was the last tetanus booster?"     Unknown if cuts.   Not mentioned. 10. OTHER SYMPTOMS: "Do you have any other symptoms?" (e.g., neck pain, vomiting)       "We stayed up with him all night and made sure he was ok".    11. PREGNANCY: "Is there any chance you are pregnant?" "When was your last menstrual period?"       N/A  Protocols used: Head Injury-A-AH

## 2023-01-31 NOTE — Progress Notes (Signed)
Date:  01/31/2023   Name:  Raymond [name]   DOB:  27-Apr-1955   MRN:  [id number]   Chief Complaint: Fall (Patient was bringing his horse in because it was raining. Patient wife said that he told her the horse knocked him down, and he does not remember anything. Was repeating the same questions over and over again per wife. Patient was covered in mud. Unsure if he hit his head. Patient seen UC last night who told him to go to ER. Want to [facility] and they were full so they left. )  Headache  This is a new problem. The current episode started yesterday. The problem occurs constantly. The problem has been gradually improving. The pain is located in the Frontal region. The pain does not radiate. The quality of the pain is described as aching. Associated symptoms include back pain, neck pain and scalp tenderness. Pertinent negatives include no blurred vision, dizziness, nausea, photophobia, tinnitus, visual change, vomiting or weakness. Nothing aggravates the symptoms.  Back Pain This is a new problem. The current episode started yesterday. The problem occurs constantly. The problem has been gradually worsening since onset. The pain is present in the thoracic spine (between shoulder blades and posterior neck). The pain is mild. Associated symptoms include headaches. Pertinent negatives include no chest pain or weakness.  Fall with confusion - see UC note for full description.  He fell down in the horse ring and hit his head on the ground.  He is sure he was not kicked.  He is not longer asking the same question over and over.  He is able to relate the events after he got up from the fall.  He had mild nausea last PM but that has resolved.     Review of Systems  Constitutional:  Negative for chills and fatigue.  HENT:  Negative for tinnitus.   Eyes:  Negative for blurred vision and photophobia.  Respiratory:  Negative for chest tightness and shortness of breath.   Cardiovascular:  Negative for chest pain.   Gastrointestinal:  Negative for nausea and vomiting.  Musculoskeletal:  Positive for back pain, myalgias and neck pain.  Neurological:  Positive for headaches. Negative for dizziness, speech difficulty, weakness and light-headedness.  Psychiatric/Behavioral:  Negative for dysphoric mood and sleep disturbance. The patient is not nervous/anxious.      Lab Results  Component Value Date   NA 140 01/16/2023   K 3.8 01/16/2023   CO2 25 01/16/2023   GLUCOSE 77 01/16/2023   BUN 17 01/16/2023   CREATININE 0.87 01/16/2023   CALCIUM 9.8 01/16/2023   EGFR 94 01/16/2023   GFRNONAA >60 05/03/2022   Lab Results  Component Value Date   CHOL 145 01/16/2023   HDL 54 01/16/2023   LDLCALC 78 01/16/2023   TRIG 67 01/16/2023   CHOLHDL 3.3 07/24/2016   Lab Results  Component Value Date   TSH 2.620 11/07/2018   Lab Results  Component Value Date   HGBA1C 5.7 06/19/2022   Lab Results  Component Value Date   WBC 7.8 05/03/2022   HGB 15.6 05/09/2022   HCT 46.0 05/09/2022   MCV 85.3 05/03/2022   PLT 230 05/03/2022   Lab Results  Component Value Date   ALT 22 11/08/2021   AST 18 11/08/2021   ALKPHOS 61 11/08/2021   BILITOT 0.9 11/08/2021   No results found for: "25OHVITD2", "25OHVITD3", "VD25OH"   Patient Active Problem List   Diagnosis Date Noted   Varicose veins  of leg with pain, left 12/30/2018   Carpal tunnel syndrome, right 08/09/2018   Primary osteoarthritis of left shoulder 08/09/2018   Rotator cuff tendinitis, left 08/09/2018   Strain of left knee 08/06/2017   Obesity (BMI 35.0-39.9 without comorbidity) 08/03/2017   Status post arthroscopy of left knee 08/03/2017   Complex tear of medial meniscus of left knee as current injury 07/26/2017   Chest pain 07/24/2016   Uncontrolled type 2 diabetes mellitus without complication, without long-term current use of insulin 07/24/2016   Status post total hip replacement, left 01/25/2016   Benign essential HTN 07/21/2015   Depression  07/21/2015   Hyperlipidemia, mixed 07/21/2015   GERD (gastroesophageal reflux disease) 12/04/2014   Biceps tendinitis 07/21/2014   Impingement syndrome of shoulder 07/21/2014   Primary osteoarthritis of right knee 08/14/2013   Neuritis or radiculitis due to rupture of lumbar intervertebral disc 08/14/2013    No Known Allergies  Past Surgical History:  Procedure Laterality Date   CARPAL TUNNEL RELEASE Right 01/02/2019   Procedure: CARPAL TUNNEL RELEASE ENDOSCOPIC;  Surgeon: Christena Flake, MD;  Location: [facility];  Service: Orthopedics;  Laterality: Right;   CARPAL TUNNEL RELEASE Left 05/09/2022   Procedure: ENDOSCOPIC LEFT CARPAL TUNNEL RELEASE WITH RELEASE OF FIRST DORSAL COMPARTMENT LEFT WRIST;  Surgeon: Christena Flake, MD;  Location: [facility];  Service: Orthopedics;  Laterality: Left;   CHOLECYSTECTOMY  1988   CHONDROPLASTY Right 10/05/2009   knee- shaving patellofemoral joint   COLONOSCOPY  2010   COLONOSCOPY WITH PROPOFOL N/A 10/12/2021   Procedure: COLONOSCOPY WITH PROPOFOL;  Surgeon: Earline Mayotte, MD;  Location: [facility];  Service: Endoscopy;  Laterality: N/A;   ENDOVENOUS ABLATION SAPHENOUS VEIN W/ LASER     INJECTION KNEE Left 12/23/2019   Procedure: KNEE INJECTION;  Surgeon: Christena Flake, MD;  Location: [facility];  Service: Orthopedics;  Laterality: Left;   KNEE ARTHROSCOPY Left 07/25/2017   Procedure: Arthroscopic partial medial meniscectomy; arthroscopic debridement; and arthroscopic abrasion chondroplasty of medial femoral condyle, lateral tibial plateau, and femoral trochlea; left knee.;  Surgeon: Christena Flake, MD;  Location: [facility];  Service: Orthopedics;  Laterality: Left;   KNEE ARTHROSCOPY WITH MEDIAL MENISECTOMY Right    Partial medial and lateral menisectomy   REVERSE SHOULDER ARTHROPLASTY Right 11/15/2021   Procedure: ANATOMIC  SHOULDER ARTHROPLASTY WITH BICEPS TENODESIS;  Surgeon: Christena Flake, MD;  Location: [facility];  Service:  Orthopedics;  Laterality: Right;   SHOULDER ARTHROSCOPY Left 08/10/2014   Procedure: ARTHROSCOPY SHOULDER WITH LIMITED DEBRIDEMENT, REMOVAL OF LOOSE BODY AND RELEASE OF LONG END BICEPS TENDON;  Surgeon: Erin Sons, MD;  Location: [facility];  Service: Orthopedics;  Laterality: Left;   TOTAL HIP ARTHROPLASTY Left 01/25/2016   Procedure: TOTAL HIP ARTHROPLASTY;  Surgeon: Christena Flake, MD;  Location: [facility];  Service: Orthopedics;  Laterality: Left;   TOTAL KNEE ARTHROPLASTY Right 12/23/2019   Procedure: TOTAL KNEE ARTHROPLASTY;  Surgeon: Christena Flake, MD;  Location: [facility];  Service: Orthopedics;  Laterality: Right;   TOTAL KNEE ARTHROPLASTY Left 11/11/2020   Procedure: TOTAL KNEE ARTHROPLASTY;  Surgeon: Christena Flake, MD;  Location: [facility];  Service: Orthopedics;  Laterality: Left;    Social History   Tobacco Use   Smoking status: Never   Smokeless tobacco: Never  Vaping Use   Vaping status: Never Used  Substance Use Topics   Alcohol use: Yes    Comment: rare   Drug use: No     Medication  list has been reviewed and updated.  Current Meds  Medication Sig   ACCU-CHEK AVIVA PLUS test strip TEST AS DIRECTED   acetaminophen (TYLENOL) 325 MG tablet Take 650 mg by mouth every 6 (six) hours as needed.   amLODipine (NORVASC) 10 MG tablet Take 1 tablet (10 mg total) by mouth daily.   aspirin EC 81 MG tablet Take 81 mg by mouth daily. Swallow whole.   buPROPion (WELLBUTRIN) 75 MG tablet Take 1 tablet (75 mg total) by mouth 2 (two) times daily.   cyclobenzaprine (FLEXERIL) 10 MG tablet Take 10 mg by mouth 2 (two) times daily as needed.   gabapentin (NEURONTIN) 300 MG capsule Take 3 capsules (900 mg total) by mouth 3 (three) times daily.   glipiZIDE (GLUCOTROL) 5 MG tablet Take 1 tablet (5 mg total) by mouth 2 (two) times daily before a meal. (Patient taking differently: Take 5 mg by mouth 1 day or 1 dose.)   glucosamine-chondroitin 500-400 MG tablet Take 2 tablets by  mouth daily.   HYDROcodone-acetaminophen (NORCO/VICODIN) 5-325 MG tablet Take 1-2 tablets by mouth every 6 (six) hours as needed for moderate pain or severe pain.   losartan (COZAAR) 100 MG tablet Take 0.5 tablets (50 mg total) by mouth daily.   metFORMIN (GLUCOPHAGE) 500 MG tablet Take 2 tablets (1,000 mg total) by mouth 2 (two) times daily with a meal.   pantoprazole (PROTONIX) 40 MG tablet Take 1 tablet (40 mg total) by mouth daily.   rosuvastatin (CRESTOR) 10 MG tablet Take 1 tablet (10 mg total) by mouth daily.   Semaglutide,0.25 or 0.5MG /DOS, 2 MG/3ML SOPN Inject into the skin.   sertraline (ZOLOFT) 50 MG tablet Take 1 tablet (50 mg total) by mouth daily.   triamterene-hydrochlorothiazide (DYAZIDE) 37.5-25 MG capsule Take 1 each (1 capsule total) by mouth daily.       01/31/2023   11:25 AM 01/16/2023   11:25 AM 07/04/2022   10:39 AM 01/03/2022   10:30 AM  GAD 7 : Generalized Anxiety Score  Nervous, Anxious, on Edge 0 0 0 0  Control/stop worrying 0 0 0 0  Worry too much - different things 0 0 0 0  Trouble relaxing 0 0 0 0  Restless 0 0 0 0  Easily annoyed or irritable 0 0 0 0  Afraid - awful might happen 0 0 0 0  Total GAD 7 Score 0 0 0 0  Anxiety Difficulty Not difficult at all Not difficult at all Not difficult at all Not difficult at all       01/31/2023   11:25 AM 01/16/2023   11:24 AM 07/04/2022   10:39 AM  Depression screen PHQ 2/9  Decreased Interest 0 0 0  Down, Depressed, Hopeless 0 0 0  PHQ - 2 Score 0 0 0  Altered sleeping 0 0 0  Tired, decreased energy 0 0 0  Change in appetite 0 0 0  Feeling bad or failure about yourself  0 0 0  Trouble concentrating 0 0 0  Moving slowly or fidgety/restless 0 0 0  Suicidal thoughts 0 0 0  PHQ-9 Score 0 0 0  Difficult doing work/chores Not difficult at all Not difficult at all Not difficult at all    BP Readings from Last 3 Encounters:  01/31/23 112/74  01/30/23 115/71  01/16/23 134/76    Physical Exam Vitals and  nursing note reviewed.  Constitutional:      General: He is not in acute distress.    Appearance:  Normal appearance. He is well-developed.  HENT:     Head: Normocephalic and atraumatic. No abrasion, contusion or laceration.  Eyes:     Extraocular Movements: Extraocular movements intact.  Cardiovascular:     Rate and Rhythm: Normal rate and regular rhythm.  Pulmonary:     Effort: Pulmonary effort is normal. No respiratory distress.     Breath sounds: No wheezing or rhonchi.  Musculoskeletal:     Cervical back: Spasms and tenderness present. Decreased range of motion.  Skin:    General: Skin is warm and dry.     Findings: No rash.  Neurological:     General: No focal deficit present.     Mental Status: He is alert and oriented to person, place, and time.     Cranial Nerves: Cranial nerves 2-12 are intact.     Motor: Motor function is intact.     Deep Tendon Reflexes:     Reflex Scores:      Bicep reflexes are 2+ on the right side and 2+ on the left side. Psychiatric:        Mood and Affect: Mood normal.        Behavior: Behavior normal.     Wt Readings from Last 3 Encounters:  01/31/23 212 lb (96.2 kg)  01/16/23 214 lb 9.6 oz (97.3 kg)  07/04/22 226 lb (102.5 kg)    BP 112/74   Pulse 75   Ht 6\' 1"  (1.854 m)   Wt 212 lb (96.2 kg)   SpO2 100%   BMI 27.97 kg/m   Assessment and Plan:  Problem List Items Addressed This Visit   None Visit Diagnoses     Fall, initial encounter    -  Primary   fell yesterday with subsequent mild confusion and headache Neck muscles tender but denies bruising   Relevant Orders   CT HEAD WO CONTRAST ( )   Frontal headache       Continue Tylenol as needed - no Advil, Aleve or ASA will get STAT CT brain w/o   Relevant Orders   CT HEAD WO CONTRAST ( )   Confusion state       He was acutely confused after the fall today feels improved - was able to drive himself here       No follow-ups on file.    Reubin Milan,  MD [facility] [place]

## 2023-01-31 NOTE — Telephone Encounter (Signed)
  Chief Complaint: Concussion per urgent care provider.   See notes Symptoms: Can't remember what happened, possibly knocked out but not sure, repeating things.  No one witnessed the incident.   Frequency: As best as they can tell he was knocked down by a horse yesterday.    Pertinent Negatives: Grover Canavan, daughter denies having any scans of his head done at the urgent care.  (Went there last night).   They recommended he have a CT scan done and to go on to the ED.   They went to the ED at [facility] but he left because it was crowded and he didn't want to wait.  Disposition: [x] ED /[] Urgent Care (no appt availability in office) / [] Appointment(In office/virtual)/ []  [place] [facility]/ [] Home Care/ [] Refused Recommended Disposition /[]  Mobile Bus/ []  Follow-up with PCP Additional Notes: I have let daughter, Grover Canavan know he needs to go to the ED for a neurological evaluation and for CT or MRI scan.   She was agreeable to this and is going to see if she can get him to go.

## 2023-03-07 ENCOUNTER — Other Ambulatory Visit: Payer: Self-pay

## 2023-03-07 DIAGNOSIS — E119 Type 2 diabetes mellitus without complications: Secondary | ICD-10-CM

## 2023-03-07 MED ORDER — METFORMIN HCL 500 MG PO TABS
1000.0000 mg | ORAL_TABLET | Freq: Two times a day (BID) | ORAL | 0 refills | Status: DC
Start: 2023-03-07 — End: 2023-05-28

## 2023-03-26 ENCOUNTER — Telehealth: Payer: Self-pay | Admitting: Family Medicine

## 2023-03-26 NOTE — Telephone Encounter (Signed)
Copied from CRM [id number]. Topic: Medicare AWV >> Mar 26, 2023  9:46 AM Payton Doughty wrote: Reason for CRM: Called LVM 03/26/2023 to schedule AWV. Please schedule office or virtual visits.  Verlee Rossetti; Care Guide Ambulatory Clinical Support  l [facility] Direct Dial: [phone]

## 2023-04-26 ENCOUNTER — Ambulatory Visit: Admitting: Internal Medicine

## 2023-04-26 ENCOUNTER — Ambulatory Visit: Payer: Self-pay | Admitting: Family Medicine

## 2023-04-26 ENCOUNTER — Encounter: Payer: Self-pay | Admitting: Internal Medicine

## 2023-04-26 DIAGNOSIS — J0100 Acute maxillary sinusitis, unspecified: Secondary | ICD-10-CM

## 2023-04-26 MED ORDER — AMOXICILLIN-POT CLAVULANATE 875-125 MG PO TABS: 1.0000 | ORAL_TABLET | Freq: Two times a day (BID) | ORAL | 0 refills | Status: AC

## 2023-04-26 NOTE — Telephone Encounter (Signed)
 Noted  KP

## 2023-04-26 NOTE — Progress Notes (Signed)
 Date:  04/26/2023   Name:  Raymond [name]   DOB:  Sep 30, 1955   MRN:  [id number]   Chief Complaint: Sinus Problem (Patient said he has yellow mucus, there is blood in mucus, has had it for 1 week  )  Sinus Problem This is a new problem. The current episode started in the past 7 days. The pain is mild. Associated symptoms include congestion and sinus pressure. Pertinent negatives include no chills, coughing, ear pain, headaches, hoarse voice, shortness of breath or swollen glands. (Streaky green mucous)    Review of Systems  Constitutional:  Negative for chills, fatigue and fever.  HENT:  Positive for congestion, nosebleeds (streaky blood with nasal discharge) and sinus pressure. Negative for ear pain, hoarse voice and trouble swallowing.   Respiratory:  Negative for cough, chest tightness, shortness of breath and wheezing.   Cardiovascular:  Negative for chest pain.  Neurological:  Negative for dizziness and headaches.  Psychiatric/Behavioral:  Negative for dysphoric mood and sleep disturbance. The patient is not nervous/anxious.      Lab Results  Component Value Date   NA 140 01/16/2023   K 3.8 01/16/2023   CO2 25 01/16/2023   GLUCOSE 77 01/16/2023   BUN 17 01/16/2023   CREATININE 0.87 01/16/2023   CALCIUM 9.8 01/16/2023   EGFR 94 01/16/2023   GFRNONAA >60 05/03/2022   Lab Results  Component Value Date   CHOL 145 01/16/2023   HDL 54 01/16/2023   LDLCALC 78 01/16/2023   TRIG 67 01/16/2023   CHOLHDL 3.3 07/24/2016   Lab Results  Component Value Date   TSH 2.620 11/07/2018   Lab Results  Component Value Date   HGBA1C 5.7 06/19/2022   Lab Results  Component Value Date   WBC 7.8 05/03/2022   HGB 15.6 05/09/2022   HCT 46.0 05/09/2022   MCV 85.3 05/03/2022   PLT 230 05/03/2022   Lab Results  Component Value Date   ALT 22 11/08/2021   AST 18 11/08/2021   ALKPHOS 61 11/08/2021   BILITOT 0.9 11/08/2021   No results found for: "25OHVITD2", "25OHVITD3", "VD25OH"    Patient Active Problem List   Diagnosis Date Noted   Varicose veins of leg with pain, left 12/30/2018   Carpal tunnel syndrome, right 08/09/2018   Primary osteoarthritis of left shoulder 08/09/2018   Rotator cuff tendinitis, left 08/09/2018   Strain of left knee 08/06/2017   Obesity (BMI 35.0-39.9 without comorbidity) 08/03/2017   Status post arthroscopy of left knee 08/03/2017   Complex tear of medial meniscus of left knee as current injury 07/26/2017   Chest pain 07/24/2016   Uncontrolled type 2 diabetes mellitus without complication, without long-term current use of insulin 07/24/2016   Status post total hip replacement, left 01/25/2016   Benign essential HTN 07/21/2015   Depression 07/21/2015   Hyperlipidemia, mixed 07/21/2015   GERD (gastroesophageal reflux disease) 12/04/2014   Biceps tendinitis 07/21/2014   Impingement syndrome of shoulder 07/21/2014   Primary osteoarthritis of right knee 08/14/2013   Neuritis or radiculitis due to rupture of lumbar intervertebral disc 08/14/2013    No Known Allergies  Past Surgical History:  Procedure Laterality Date   CARPAL TUNNEL RELEASE Right 01/02/2019   Procedure: CARPAL TUNNEL RELEASE ENDOSCOPIC;  Surgeon: Christena Flake, MD;  Location: [facility];  Service: Orthopedics;  Laterality: Right;   CARPAL TUNNEL RELEASE Left 05/09/2022   Procedure: ENDOSCOPIC LEFT CARPAL TUNNEL RELEASE WITH RELEASE OF FIRST DORSAL COMPARTMENT LEFT WRIST;  Surgeon:  Poggi, Excell Seltzer, MD;  Location: [facility];  Service: Orthopedics;  Laterality: Left;   CHOLECYSTECTOMY  1988   CHONDROPLASTY Right 10/05/2009   knee- shaving patellofemoral joint   COLONOSCOPY  2010   COLONOSCOPY WITH PROPOFOL N/A 10/12/2021   Procedure: COLONOSCOPY WITH PROPOFOL;  Surgeon: Earline Mayotte, MD;  Location: [facility];  Service: Endoscopy;  Laterality: N/A;   ENDOVENOUS ABLATION SAPHENOUS VEIN W/ LASER     INJECTION KNEE Left 12/23/2019   Procedure: KNEE INJECTION;   Surgeon: Christena Flake, MD;  Location: [facility];  Service: Orthopedics;  Laterality: Left;   KNEE ARTHROSCOPY Left 07/25/2017   Procedure: Arthroscopic partial medial meniscectomy; arthroscopic debridement; and arthroscopic abrasion chondroplasty of medial femoral condyle, lateral tibial plateau, and femoral trochlea; left knee.;  Surgeon: Christena Flake, MD;  Location: [facility];  Service: Orthopedics;  Laterality: Left;   KNEE ARTHROSCOPY WITH MEDIAL MENISECTOMY Right    Partial medial and lateral menisectomy   REVERSE SHOULDER ARTHROPLASTY Right 11/15/2021   Procedure: ANATOMIC  SHOULDER ARTHROPLASTY WITH BICEPS TENODESIS;  Surgeon: Christena Flake, MD;  Location: [facility];  Service: Orthopedics;  Laterality: Right;   SHOULDER ARTHROSCOPY Left 08/10/2014   Procedure: ARTHROSCOPY SHOULDER WITH LIMITED DEBRIDEMENT, REMOVAL OF LOOSE BODY AND RELEASE OF LONG END BICEPS TENDON;  Surgeon: Erin Sons, MD;  Location: [facility];  Service: Orthopedics;  Laterality: Left;   TOTAL HIP ARTHROPLASTY Left 01/25/2016   Procedure: TOTAL HIP ARTHROPLASTY;  Surgeon: Christena Flake, MD;  Location: [facility];  Service: Orthopedics;  Laterality: Left;   TOTAL KNEE ARTHROPLASTY Right 12/23/2019   Procedure: TOTAL KNEE ARTHROPLASTY;  Surgeon: Christena Flake, MD;  Location: [facility];  Service: Orthopedics;  Laterality: Right;   TOTAL KNEE ARTHROPLASTY Left 11/11/2020   Procedure: TOTAL KNEE ARTHROPLASTY;  Surgeon: Christena Flake, MD;  Location: [facility];  Service: Orthopedics;  Laterality: Left;    Social History   Tobacco Use   Smoking status: Never   Smokeless tobacco: Never  Vaping Use   Vaping status: Never Used  Substance Use Topics   Alcohol use: Yes    Comment: rare   Drug use: No     Medication list has been reviewed and updated.  Current Meds  Medication Sig   ACCU-CHEK AVIVA PLUS test strip TEST AS DIRECTED   acetaminophen (TYLENOL) 325 MG tablet Take 650 mg by mouth every 6  (six) hours as needed.   amLODipine (NORVASC) 10 MG tablet Take 1 tablet (10 mg total) by mouth daily.   amoxicillin-clavulanate (AUGMENTIN) 875-125 MG tablet Take 1 tablet by mouth 2 (two) times daily for 10 days.   aspirin EC 81 MG tablet Take 81 mg by mouth daily. Swallow whole.   buPROPion (WELLBUTRIN) 75 MG tablet Take 1 tablet (75 mg total) by mouth 2 (two) times daily.   cyclobenzaprine (FLEXERIL) 10 MG tablet Take 10 mg by mouth as needed.   glipiZIDE (GLUCOTROL) 5 MG tablet Take 1 tablet (5 mg total) by mouth 2 (two) times daily before a meal. (Patient taking differently: Take 5 mg by mouth 1 day or 1 dose.)   glucosamine-chondroitin 500-400 MG tablet Take 2 tablets by mouth daily.   losartan (COZAAR) 100 MG tablet Take 0.5 tablets (50 mg total) by mouth daily.   metFORMIN (GLUCOPHAGE) 500 MG tablet Take 2 tablets (1,000 mg total) by mouth 2 (two) times daily with a meal.   pantoprazole (PROTONIX) 40 MG tablet Take 1 tablet (40 mg  total) by mouth daily.   rosuvastatin (CRESTOR) 10 MG tablet Take 1 tablet (10 mg total) by mouth daily.   Semaglutide,0.25 or 0.5MG /DOS, 2 MG/3ML SOPN Inject into the skin.   sertraline (ZOLOFT) 50 MG tablet Take 1 tablet (50 mg total) by mouth daily.   triamterene-hydrochlorothiazide (DYAZIDE) 37.5-25 MG capsule Take 1 each (1 capsule total) by mouth daily.       04/26/2023   10:41 AM 01/31/2023   11:25 AM 01/16/2023   11:25 AM 07/04/2022   10:39 AM  GAD 7 : Generalized Anxiety Score  Nervous, Anxious, on Edge 0 0 0 0  Control/stop worrying 0 0 0 0  Worry too much - different things 0 0 0 0  Trouble relaxing 0 0 0 0  Restless 0 0 0 0  Easily annoyed or irritable 0 0 0 0  Afraid - awful might happen 0 0 0 0  Total GAD 7 Score 0 0 0 0  Anxiety Difficulty Not difficult at all Not difficult at all Not difficult at all Not difficult at all       04/26/2023   10:41 AM 01/31/2023   11:25 AM 01/16/2023   11:24 AM  Depression screen PHQ 2/9  Decreased  Interest 0 0 0  Down, Depressed, Hopeless 0 0 0  PHQ - 2 Score 0 0 0  Altered sleeping 0 0 0  Tired, decreased energy 0 0 0  Change in appetite 0 0 0  Feeling bad or failure about yourself  0 0 0  Trouble concentrating 0 0 0  Moving slowly or fidgety/restless 0 0 0  Suicidal thoughts 0 0 0  PHQ-9 Score 0 0 0  Difficult doing work/chores Not difficult at all Not difficult at all Not difficult at all    BP Readings from Last 3 Encounters:  04/26/23 118/75  01/31/23 112/74  01/30/23 115/71    Physical Exam Vitals and nursing note reviewed.  Constitutional:      General: He is not in acute distress.    Appearance: Normal appearance. He is well-developed.  HENT:     Head: Normocephalic and atraumatic.     Right Ear: Ear canal and external ear normal. Tympanic membrane is not erythematous or retracted.     Left Ear: Ear canal and external ear normal. Tympanic membrane is not erythematous or retracted.     Nose:     Right Nostril: Epistaxis (dried blood is the distal nare) present.     Right Turbinates: Swollen.     Right Sinus: Maxillary sinus tenderness present. No frontal sinus tenderness.     Left Sinus: Maxillary sinus tenderness present. No frontal sinus tenderness.     Mouth/Throat:     Mouth: No oral lesions.     Pharynx: Uvula midline. Posterior oropharyngeal erythema present. No oropharyngeal exudate.  Cardiovascular:     Rate and Rhythm: Normal rate and regular rhythm.     Heart sounds: Normal heart sounds.  Pulmonary:     Effort: Pulmonary effort is normal. No respiratory distress.     Breath sounds: Normal breath sounds. No wheezing or rales.  Lymphadenopathy:     Cervical: No cervical adenopathy.  Skin:    General: Skin is warm and dry.     Findings: No rash.  Neurological:     Mental Status: He is alert and oriented to person, place, and time.  Psychiatric:        Mood and Affect: Mood normal.  Behavior: Behavior normal.     Wt Readings from Last  3 Encounters:  04/26/23 212 lb 4 oz (96.3 kg)  01/31/23 212 lb (96.2 kg)  01/16/23 214 lb 9.6 oz (97.3 kg)    BP 118/75   Pulse 67   Temp 98.2 F (36.8 C)   Ht 6\' 1"  (1.854 m)   Wt 212 lb 4 oz (96.3 kg)   SpO2 99%   BMI 28.00 kg/m   Assessment and Plan:  Problem List Items Addressed This Visit   None Visit Diagnoses       Acute non-recurrent maxillary sinusitis       continue Coricidin decongestant add Saline NS liberally to moisturize nasal mucosa   Relevant Medications   amoxicillin-clavulanate (AUGMENTIN) 875-125 MG tablet       No follow-ups on file.    Reubin Milan, MD [facility] [place]

## 2023-04-26 NOTE — Telephone Encounter (Signed)
 Copied from CRM [id number]. Topic: Clinical - Red Word Triage >> Apr 26, 2023  9:34 AM Higinio Roger wrote: Red Word that prompted transfer to Nurse Triage: Headaches, signs of confusion, upper respiratory issues, Blood discharge from nose and throat.Patient is also a diabetic  Chief Complaint: cough, frequent nose bleeds Symptoms: confusion x 2 day, runy nose Frequency: 2 days Disposition: [] ED /[] Urgent Care (no appt availability in office) / [x] Appointment(In office/virtual)/ []  [facility]/ [] Home Care/ [] Refused Recommended Disposition /[] [facility]/ []  Follow-up with PCP Additional Notes: refused UC only will go to appt  Reason for Disposition  [1] Acting confused (e.g., disoriented, slurred speech) AND [2] brief (now gone)  Answer Assessment - Initial Assessment Questions 1. LEVEL OF CONSCIOUSNESS: "How is he (she, the patient) acting right now?" (e.g., alert-oriented, confused, lethargic, stuporous, comatose)     Looks like he is spced out" 2. ONSET: "When did the confusion start?"  (minutes, hours, days)     Last 2 days  3. PATTERN "Does this come and go, or has it been constant since it started?"  "Is it present now?"     Comes and goes 6. CAUSE: "What do you think is causing the confusion?"      Unsure if it is due to present illness 7. OTHER SYMPTOMS: "Are there any other symptoms?" (e.g., difficulty breathing, headache, fever, weakness)     Bloody nasal discharge dripping blood down face, forgetful acting spaced out, poor appetite  Protocols used: Confusion - Delirium-A-AH

## 2023-05-28 ENCOUNTER — Other Ambulatory Visit: Payer: Self-pay

## 2023-05-28 DIAGNOSIS — E119 Type 2 diabetes mellitus without complications: Secondary | ICD-10-CM

## 2023-05-28 MED ORDER — METFORMIN HCL 500 MG PO TABS: 1000.0000 mg | ORAL_TABLET | Freq: Two times a day (BID) | ORAL | 0 refills | Status: DC

## 2023-06-18 DIAGNOSIS — I152 Hypertension secondary to endocrine disorders: Secondary | ICD-10-CM | POA: Diagnosis not present

## 2023-06-18 DIAGNOSIS — E1159 Type 2 diabetes mellitus with other circulatory complications: Secondary | ICD-10-CM | POA: Diagnosis not present

## 2023-06-18 DIAGNOSIS — E1169 Type 2 diabetes mellitus with other specified complication: Secondary | ICD-10-CM | POA: Diagnosis not present

## 2023-06-18 DIAGNOSIS — E1142 Type 2 diabetes mellitus with diabetic polyneuropathy: Secondary | ICD-10-CM | POA: Diagnosis not present

## 2023-07-17 ENCOUNTER — Ambulatory Visit (INDEPENDENT_AMBULATORY_CARE_PROVIDER_SITE_OTHER): Payer: Self-pay | Admitting: Family Medicine

## 2023-07-17 ENCOUNTER — Encounter: Payer: Self-pay | Admitting: Family Medicine

## 2023-07-17 VITALS — BP 124/70 | HR 62 | Ht 73.0 in | Wt 218.0 lb

## 2023-07-17 DIAGNOSIS — Z8659 Personal history of other mental and behavioral disorders: Secondary | ICD-10-CM

## 2023-07-17 DIAGNOSIS — E119 Type 2 diabetes mellitus without complications: Secondary | ICD-10-CM

## 2023-07-17 DIAGNOSIS — I10 Essential (primary) hypertension: Secondary | ICD-10-CM | POA: Diagnosis not present

## 2023-07-17 DIAGNOSIS — F3341 Major depressive disorder, recurrent, in partial remission: Secondary | ICD-10-CM | POA: Diagnosis not present

## 2023-07-17 DIAGNOSIS — K219 Gastro-esophageal reflux disease without esophagitis: Secondary | ICD-10-CM

## 2023-07-17 DIAGNOSIS — F419 Anxiety disorder, unspecified: Secondary | ICD-10-CM

## 2023-07-17 DIAGNOSIS — E7801 Familial hypercholesterolemia: Secondary | ICD-10-CM

## 2023-07-17 DIAGNOSIS — Z7984 Long term (current) use of oral hypoglycemic drugs: Secondary | ICD-10-CM

## 2023-07-17 MED ORDER — LOSARTAN POTASSIUM 100 MG PO TABS
50.0000 mg | ORAL_TABLET | Freq: Every day | ORAL | 1 refills | Status: AC
Start: 2023-07-17 — End: ?

## 2023-07-17 MED ORDER — BUPROPION HCL 75 MG PO TABS
75.0000 mg | ORAL_TABLET | Freq: Two times a day (BID) | ORAL | 1 refills | Status: AC
Start: 2023-07-17 — End: ?

## 2023-07-17 MED ORDER — SERTRALINE HCL 50 MG PO TABS
50.0000 mg | ORAL_TABLET | Freq: Every day | ORAL | 1 refills | Status: AC
Start: 2023-07-17 — End: ?

## 2023-07-17 MED ORDER — GLIPIZIDE 5 MG PO TABS: 5.0000 mg | ORAL_TABLET | ORAL | 1 refills | Status: AC

## 2023-07-17 MED ORDER — PANTOPRAZOLE SODIUM 40 MG PO TBEC: 40.0000 mg | DELAYED_RELEASE_TABLET | Freq: Every day | ORAL | 1 refills | Status: AC

## 2023-07-17 MED ORDER — ROSUVASTATIN CALCIUM 10 MG PO TABS: 10.0000 mg | ORAL_TABLET | Freq: Every day | ORAL | 1 refills | Status: AC

## 2023-07-17 MED ORDER — METFORMIN HCL 500 MG PO TABS: 1000.0000 mg | ORAL_TABLET | Freq: Two times a day (BID) | ORAL | 0 refills | Status: AC

## 2023-07-17 MED ORDER — TRIAMTERENE-HCTZ 37.5-25 MG PO CAPS
1.0000 | ORAL_CAPSULE | Freq: Every day | ORAL | 1 refills | Status: AC
Start: 2023-07-17 — End: ?

## 2023-07-17 MED ORDER — AMLODIPINE BESYLATE 10 MG PO TABS: 10.0000 mg | ORAL_TABLET | Freq: Every day | ORAL | 1 refills | Status: AC

## 2023-07-17 NOTE — Progress Notes (Signed)
 Date:  07/17/2023   Name:  Raymond [name]   DOB:  [birth year]   MRN:  [id number]   Chief Complaint: Hypertension, Anxiety, and Depression  Hypertension This is a chronic problem. The current episode started more than 1 year ago. The problem has been gradually improving since onset. The problem is controlled. Associated symptoms include anxiety. Pertinent negatives include no blurred vision, chest pain, headaches, orthopnea, palpitations, PND or shortness of breath. There are no associated agents to hypertension. Risk factors for coronary artery disease include diabetes mellitus. Past treatments include diuretics, calcium  channel blockers and angiotensin blockers. The current treatment provides moderate improvement. There is no history of CAD/MI or left ventricular hypertrophy. There is no history of chronic renal disease, a hypertension causing med or renovascular disease.  Anxiety Presents for follow-up visit. Patient reports no chest pain, compulsions, confusion, depressed mood, excessive worry, insomnia, irritability, muscle tension, palpitations, restlessness, shortness of breath or suicidal ideas. Symptoms occur occasionally.    Depression        This is a chronic problem.  The current episode started more than 1 year ago.   The onset quality is sudden.   Associated symptoms include sad.  Associated symptoms include no fatigue, no helplessness, no hopelessness, does not have insomnia, not irritable, no restlessness, no decreased interest, no headaches and no suicidal ideas.  Past treatments include SSRIs - Selective serotonin reuptake inhibitors.  Compliance with treatment is good.  Previous treatment provided moderate relief.  Past medical history includes anxiety.   Diabetes He presents for his follow-up diabetic visit. He has type 2 diabetes mellitus. His disease course has been stable. Pertinent negatives for hypoglycemia include no confusion or headaches. Pertinent negatives for diabetes  include no blurred vision, no chest pain, no fatigue, no polyphagia, no polyuria and no weakness. Symptoms are stable. Current diabetic treatment includes oral agent (dual therapy) (and ozempic). He is compliant with treatment some of the time. His weight is stable. He is following a generally healthy diet. Meal planning includes avoidance of concentrated sweets and carbohydrate counting. He participates in exercise daily. An ACE inhibitor/angiotensin II receptor blocker is being taken.    Lab Results  Component Value Date   NA 140 01/16/2023   K 3.8 01/16/2023   CO2 25 01/16/2023   GLUCOSE 77 01/16/2023   BUN 17 01/16/2023   CREATININE 0.87 01/16/2023   CALCIUM  9.8 01/16/2023   EGFR 94 01/16/2023   GFRNONAA >60 05/03/2022   Lab Results  Component Value Date   CHOL 145 01/16/2023   HDL 54 01/16/2023   LDLCALC 78 01/16/2023   TRIG 67 01/16/2023   CHOLHDL 3.3 07/24/2016   Lab Results  Component Value Date   TSH 2.620 11/07/2018   Lab Results  Component Value Date   HGBA1C 5.7 06/19/2022   Lab Results  Component Value Date   WBC 7.8 05/03/2022   HGB 15.6 05/09/2022   HCT 46.0 05/09/2022   MCV 85.3 05/03/2022   PLT 230 05/03/2022   Lab Results  Component Value Date   ALT 22 11/08/2021   AST 18 11/08/2021   ALKPHOS 61 11/08/2021   BILITOT 0.9 11/08/2021   No results found for: "25OHVITD2", "25OHVITD3", "VD25OH"   Review of Systems  Constitutional:  Negative for fatigue and irritability.  Eyes:  Negative for blurred vision and visual disturbance.  Respiratory:  Negative for cough, choking, chest tightness, shortness of breath and wheezing.   Cardiovascular:  Negative for chest pain, palpitations, orthopnea  and PND.  Gastrointestinal:  Negative for abdominal distention.  Endocrine: Negative for polyphagia and polyuria.  Neurological:  Negative for weakness and headaches.  Psychiatric/Behavioral:  Positive for depression. Negative for confusion and suicidal ideas. The  patient does not have insomnia.     Patient Active Problem List   Diagnosis Date Noted   Varicose veins of leg with pain, left 12/30/2018   Carpal tunnel syndrome, right 08/09/2018   Primary osteoarthritis of left shoulder 08/09/2018   Rotator cuff tendinitis, left 08/09/2018   Strain of left knee 08/06/2017   Obesity (BMI 35.0-39.9 without comorbidity) 08/03/2017   Status post arthroscopy of left knee 08/03/2017   Complex tear of medial meniscus of left knee as current injury 07/26/2017   Chest pain 07/24/2016   Uncontrolled type 2 diabetes mellitus without complication, without long-term current use of insulin  07/24/2016   Status post total hip replacement, left 01/25/2016   Benign essential HTN 07/21/2015   Depression 07/21/2015   Hyperlipidemia, mixed 07/21/2015   GERD (gastroesophageal reflux disease) 12/04/2014   Biceps tendinitis 07/21/2014   Impingement syndrome of shoulder 07/21/2014   Primary osteoarthritis of right knee 08/14/2013   Neuritis or radiculitis due to rupture of lumbar intervertebral disc 08/14/2013    No Known Allergies  Past Surgical History:  Procedure Laterality Date   CARPAL TUNNEL RELEASE Right 01/02/2019   Procedure: CARPAL TUNNEL RELEASE ENDOSCOPIC;  Surgeon: Elner Hahn, MD;  Location: [facility];  Service: Orthopedics;  Laterality: Right;   CARPAL TUNNEL RELEASE Left 05/09/2022   Procedure: ENDOSCOPIC LEFT CARPAL TUNNEL RELEASE WITH RELEASE OF FIRST DORSAL COMPARTMENT LEFT WRIST;  Surgeon: Elner Hahn, MD;  Location: [facility];  Service: Orthopedics;  Laterality: Left;   CHOLECYSTECTOMY  1988   CHONDROPLASTY Right 10/05/2009   knee- shaving patellofemoral joint   COLONOSCOPY  2010   COLONOSCOPY WITH PROPOFOL  N/A 10/12/2021   Procedure: COLONOSCOPY WITH PROPOFOL ;  Surgeon: Marshall Skeeter, MD;  Location: [facility];  Service: Endoscopy;  Laterality: N/A;   ENDOVENOUS ABLATION SAPHENOUS VEIN W/ LASER     INJECTION KNEE Left 12/23/2019    Procedure: KNEE INJECTION;  Surgeon: Elner Hahn, MD;  Location: [facility];  Service: Orthopedics;  Laterality: Left;   KNEE ARTHROSCOPY Left 07/25/2017   Procedure: Arthroscopic partial medial meniscectomy; arthroscopic debridement; and arthroscopic abrasion chondroplasty of medial femoral condyle, lateral tibial plateau, and femoral trochlea; left knee.;  Surgeon: Elner Hahn, MD;  Location[facility];  Service: Orthopedics;  Laterality: Left;   KNEE ARTHROSCOPY WITH MEDIAL MENISECTOMY Right    Partial medial and lateral menisectomy   REVERSE SHOULDER ARTHROPLASTY Right 11/15/2021   Procedure: ANATOMIC  SHOULDER ARTHROPLASTY WITH BICEPS TENODESIS;  Surgeon: Elner Hahn, MD;  Location: [facility];  Service: Orthopedics;  Laterality: Right;   SHOULDER ARTHROSCOPY Left 08/10/2014   Procedure: ARTHROSCOPY SHOULDER WITH LIMITED DEBRIDEMENT, REMOVAL OF LOOSE BODY AND RELEASE OF LONG END BICEPS TENDON;  Surgeon: Josephus Nida, MD;  Location: [facility];  Service: Orthopedics;  Laterality: Left;   TOTAL HIP ARTHROPLASTY Left 01/25/2016   Procedure: TOTAL HIP ARTHROPLASTY;  Surgeon: Elner Hahn, MD;  Location: [facility];  Service: Orthopedics;  Laterality: Left;   TOTAL KNEE ARTHROPLASTY Right 12/23/2019   Procedure: TOTAL KNEE ARTHROPLASTY;  Surgeon: Elner Hahn, MD;  Location: [facility];  Service: Orthopedics;  Laterality: Right;   TOTAL KNEE ARTHROPLASTY Left 11/11/2020   Procedure: TOTAL KNEE ARTHROPLASTY;  Surgeon: Elner Hahn, MD;  Location: [facility];  Service: Orthopedics;  Laterality: Left;    Social History   Tobacco Use   Smoking status: Never   Smokeless tobacco: Never  Vaping Use   Vaping status: Never Used  Substance Use Topics   Alcohol use: Yes    Comment: rare   Drug use: No     Medication list has been reviewed and updated.  Current Meds  Medication Sig   ACCU-CHEK AVIVA PLUS test strip TEST AS DIRECTED   acetaminophen  (TYLENOL ) 325 MG tablet  Take 650 mg by mouth every 6 (six) hours as needed.   aspirin  EC 81 MG tablet Take 81 mg by mouth daily. Swallow whole.   cyclobenzaprine  (FLEXERIL ) 10 MG tablet Take 10 mg by mouth as needed.   glucosamine-chondroitin 500-400 MG tablet Take 2 tablets by mouth daily.   Semaglutide,0.25 or 0.5MG /DOS, 2 MG/3ML SOPN Inject into the skin.   [DISCONTINUED] amLODipine  (NORVASC ) 10 MG tablet Take 1 tablet (10 mg total) by mouth daily.   [DISCONTINUED] buPROPion  (WELLBUTRIN ) 75 MG tablet Take 1 tablet (75 mg total) by mouth 2 (two) times daily.   [DISCONTINUED] glipiZIDE  (GLUCOTROL ) 5 MG tablet Take 1 tablet (5 mg total) by mouth 2 (two) times daily before a meal. (Patient taking differently: Take 5 mg by mouth 1 day or 1 dose.)   [DISCONTINUED] losartan  (COZAAR ) 100 MG tablet Take 0.5 tablets (50 mg total) by mouth daily.   [DISCONTINUED] metFORMIN  (GLUCOPHAGE ) 500 MG tablet Take 2 tablets (1,000 mg total) by mouth 2 (two) times daily with a meal.   [DISCONTINUED] pantoprazole  (PROTONIX ) 40 MG tablet Take 1 tablet (40 mg total) by mouth daily.   [DISCONTINUED] rosuvastatin  (CRESTOR ) 10 MG tablet Take 1 tablet (10 mg total) by mouth daily.   [DISCONTINUED] sertraline  (ZOLOFT ) 50 MG tablet Take 1 tablet (50 mg total) by mouth daily.   [DISCONTINUED] triamterene -hydrochlorothiazide  (DYAZIDE) 37.5-25 MG capsule Take 1 each (1 capsule total) by mouth daily.       04/26/2023   10:41 AM 01/31/2023   11:25 AM 01/16/2023   11:25 AM 07/04/2022   10:39 AM  GAD 7 : Generalized Anxiety Score  Nervous, Anxious, on Edge 0 0 0 0  Control/stop worrying 0 0 0 0  Worry too much - different things 0 0 0 0  Trouble relaxing 0 0 0 0  Restless 0 0 0 0  Easily annoyed or irritable 0 0 0 0  Afraid - awful might happen 0 0 0 0  Total GAD 7 Score 0 0 0 0  Anxiety Difficulty Not difficult at all Not difficult at all Not difficult at all Not difficult at all       04/26/2023   10:41 AM 01/31/2023   11:25 AM 01/16/2023    11:24 AM  Depression screen PHQ 2/9  Decreased Interest 0 0 0  Down, Depressed, Hopeless 0 0 0  PHQ - 2 Score 0 0 0  Altered sleeping 0 0 0  Tired, decreased energy 0 0 0  Change in appetite 0 0 0  Feeling bad or failure about yourself  0 0 0  Trouble concentrating 0 0 0  Moving slowly or fidgety/restless 0 0 0  Suicidal thoughts 0 0 0  PHQ-9 Score 0 0 0  Difficult doing work/chores Not difficult at all Not difficult at all Not difficult at all    BP Readings from Last 3 Encounters:  07/17/23 124/70  04/26/23 118/75  01/31/23 112/74    Physical Exam Vitals and  nursing note reviewed.  Constitutional:      General: He is not irritable.    Appearance: He is well-developed.  HENT:     Head: Normocephalic and atraumatic.     Right Ear: Tympanic membrane, ear canal and external ear normal.     Left Ear: Tympanic membrane, ear canal and external ear normal.     Nose: Nose normal.     Mouth/Throat:     Mouth: Mucous membranes are moist.     Dentition: Normal dentition.  Eyes:     General: Lids are normal. No scleral icterus.    Conjunctiva/sclera: Conjunctivae normal.     Pupils: Pupils are equal, round, and reactive to light.  Neck:     Thyroid : No thyromegaly.     Vascular: No carotid bruit, hepatojugular reflux or JVD.     Trachea: No tracheal deviation.  Cardiovascular:     Rate and Rhythm: Normal rate and regular rhythm.     Pulses: Normal pulses.     Heart sounds: Normal heart sounds. No murmur heard.    No gallop.  Pulmonary:     Effort: Pulmonary effort is normal.     Breath sounds: Normal breath sounds. No wheezing, rhonchi or rales.  Abdominal:     General: Bowel sounds are normal.     Palpations: Abdomen is soft. There is no hepatomegaly, splenomegaly or mass.     Tenderness: There is no abdominal tenderness.     Hernia: There is no hernia in the left inguinal area.  Musculoskeletal:        General: Normal range of motion.     Cervical back: Normal  range of motion and neck supple.  Lymphadenopathy:     Cervical: No cervical adenopathy.  Skin:    General: Skin is warm and dry.     Findings: No rash.  Neurological:     Mental Status: He is alert and oriented to person, place, and time.     Sensory: No sensory deficit.     Deep Tendon Reflexes: Reflexes are normal and symmetric.  Psychiatric:        Mood and Affect: Mood is not anxious or depressed.     Wt Readings from Last 3 Encounters:  07/17/23 218 lb (98.9 kg)  04/26/23 212 lb 4 oz (96.3 kg)  01/31/23 212 lb (96.2 kg)    BP 124/70   Pulse 62   Ht 6\' 1"  (1.854 m)   Wt 218 lb (98.9 kg)   SpO2 98%   BMI 28.76 kg/m   Assessment and Plan: 1. Essential hypertension (Primary) Chronic.  Controlled.  Stable.  Asymptomatic.  Blood pressure today is 124/70.   Tolerating medication well.  Will continue amlodipine  10 mg once a day, losartan  100 mg 1/2 tablet daily and Dyazide 37.5-25 mg once a day.  Review of previous CMP is acceptable for electrolytes and GFR.  Will recheck patient in 6 months. - amLODipine  (NORVASC ) 10 MG tablet; Take 1 tablet (10 mg total) by mouth daily.  Dispense: 90 tablet; Refill: 1 - losartan  (COZAAR ) 100 MG tablet; Take 0.5 tablets (50 mg total) by mouth daily.  Dispense: 45 tablet; Refill: 1 - triamterene -hydrochlorothiazide  (DYAZIDE) 37.5-25 MG capsule; Take 1 each (1 capsule total) by mouth daily.  Dispense: 90 capsule; Refill: 1  2. Recurrent major depressive disorder, in partial remission (HCC) Chronic.  Controlled.  Stable.  Asymptomatic.  PHQ was 0.  GAD score is 0.  Continue bupropion  75 mg twice a day  and sertraline  50 mg once a day.  Will recheck patient in 6 months. - buPROPion  (WELLBUTRIN ) 75 MG tablet; Take 1 tablet (75 mg total) by mouth 2 (two) times daily.  Dispense: 180 tablet; Refill: 1 - sertraline  (ZOLOFT ) 50 MG tablet; Take 1 tablet (50 mg total) by mouth daily.  Dispense: 90 tablet; Refill: 1  3. Type 2 diabetes mellitus without  complication, without long-term current use of insulin  (HCC) Chronic.  Controlled.  Stable.  Patient is followed by endocrinology/Dr. Shelvy Dickens.  Continue glipizide  5 mg once a day and that Forman 500 mg twice a day.  Will check microalbuminuria for surveillance for diabetic nephropathy.  Will check lipid panel for current level of triglyceride control. - glipiZIDE  (GLUCOTROL ) 5 MG tablet; Take 1 tablet (5 mg total) by mouth 1 day or 1 dose.  Dispense: 90 tablet; Refill: 1 - metFORMIN  (GLUCOPHAGE ) 500 MG tablet; Take 2 tablets (1,000 mg total) by mouth 2 (two) times daily with a meal.  Dispense: 360 tablet; Refill: 0 - Microalbumin / creatinine urine ratio - Lipid Panel With LDL/HDL Ratio  4. Gastroesophageal reflux disease, unspecified whether esophagitis present Chronic.  Controlled.  Stable.  Continue pantoprazole  40 mg once a day. - pantoprazole  (PROTONIX ) 40 MG tablet; Take 1 tablet (40 mg total) by mouth daily.  Dispense: 90 tablet; Refill: 1  5. Familial hypercholesterolemia Chronic.  Controlled.  Stable.  Continue rosuvastatin  10 mg once a day.  Will check lipid panel for current level of LDL control. - rosuvastatin  (CRESTOR ) 10 MG tablet; Take 1 tablet (10 mg total) by mouth daily.  Dispense: 90 tablet; Refill: 1 - Lipid Panel With LDL/HDL Ratio  6. Anxiety Chronic.  Controlled.  He is stable.  GAD score 0.  Continue sertraline  50 mg daily. - sertraline  (ZOLOFT ) 50 MG tablet; Take 1 tablet (50 mg total) by mouth daily.  Dispense: 90 tablet; Refill: 1  7. History of posttraumatic stress disorder (PTSD) Patient with history of PTSD and is currently under control with sertraline  50 mg once a day and we will continue at this level as well we will recheck patient in 6 months. - sertraline  (ZOLOFT ) 50 MG tablet; Take 1 tablet (50 mg total) by mouth daily.  Dispense: 90 tablet; Refill: 1     Alayne Allis, MD

## 2023-07-30 DIAGNOSIS — H663X3 Other chronic suppurative otitis media, bilateral: Secondary | ICD-10-CM | POA: Diagnosis not present

## 2023-07-30 DIAGNOSIS — H8123 Vestibular neuronitis, bilateral: Secondary | ICD-10-CM | POA: Diagnosis not present

## 2023-07-30 DIAGNOSIS — R110 Nausea: Secondary | ICD-10-CM | POA: Diagnosis not present

## 2023-07-30 DIAGNOSIS — H903 Sensorineural hearing loss, bilateral: Secondary | ICD-10-CM | POA: Diagnosis not present

## 2023-08-03 ENCOUNTER — Other Ambulatory Visit: Payer: Self-pay | Admitting: Family Medicine

## 2023-08-03 DIAGNOSIS — F419 Anxiety disorder, unspecified: Secondary | ICD-10-CM

## 2023-08-03 DIAGNOSIS — Z8659 Personal history of other mental and behavioral disorders: Secondary | ICD-10-CM

## 2023-08-03 DIAGNOSIS — F3341 Major depressive disorder, recurrent, in partial remission: Secondary | ICD-10-CM

## 2023-08-03 NOTE — Telephone Encounter (Unsigned)
 Copied from CRM [id number]. Topic: Clinical - Medication Refill >> Aug 03, 2023  3:18 PM Fredrica W wrote: Medication: sertraline  (ZOLOFT ) 50 MG tablet  Has the patient contacted their pharmacy? Yes (Agent: If no, request that the patient contact the pharmacy for the refill. If patient does not wish to contact the pharmacy document the reason why and proceed with request.) (Agent: If yes, when and what did the pharmacy advise?)  This is the patient's preferred pharmacy:  [facility] - [place], [place] - [address] [place] [address] Phone: [phone] Fax: [phone]  Is this the correct pharmacy for this prescription? Yes If no, delete pharmacy and type the correct one.   Has the prescription been filled recently? No  Is the patient out of the medication? N/A  Has the patient been seen for an appointment in the last year OR does the patient have an upcoming appointment? Yes  Can we respond through MyChart? Yes  Agent: Please be advised that Rx refills may take up to 3 business days. We ask that you follow-up with your pharmacy.

## 2023-08-06 NOTE — Telephone Encounter (Signed)
 Requested medication (s) are due for refill today: yes  Requested medication (s) are on the active medication list: yes  Last refill:    Future visit scheduled: no  Notes to clinic:  Unable to refill per protocol, provider no longer at practice.     Requested Prescriptions  Pending Prescriptions Disp Refills   sertraline  (ZOLOFT ) 50 MG tablet 90 tablet 1    Sig: Take 1 tablet (50 mg total) by mouth daily.     Psychiatry:  Antidepressants - SSRI - sertraline  Failed - 08/06/2023  4:17 PM      Failed - AST in normal range and within 360 days    AST  Date Value Ref Range Status  11/08/2021 18 15 - 41 U/L Final         Failed - ALT in normal range and within 360 days    ALT  Date Value Ref Range Status  11/08/2021 22 0 - 44 U/L Final         Passed - Completed PHQ-2 or PHQ-9 in the last 360 days      Passed - Valid encounter within last 6 months    Recent Outpatient Visits           2 weeks ago Essential hypertension   [facility], [facility]   3 months ago Acute non-recurrent maxillary sinusitis   [facility] Chales [place], MD

## 2023-08-07 MED ORDER — SERTRALINE HCL 50 MG PO TABS: 50.0000 mg | ORAL_TABLET | Freq: Every day | ORAL | 0 refills | Status: AC

## 2023-10-11 DIAGNOSIS — Z1331 Encounter for screening for depression: Secondary | ICD-10-CM | POA: Diagnosis not present

## 2023-10-11 DIAGNOSIS — E782 Mixed hyperlipidemia: Secondary | ICD-10-CM | POA: Diagnosis not present

## 2023-10-11 DIAGNOSIS — I10 Essential (primary) hypertension: Secondary | ICD-10-CM | POA: Diagnosis not present

## 2023-10-11 DIAGNOSIS — E119 Type 2 diabetes mellitus without complications: Secondary | ICD-10-CM | POA: Diagnosis not present

## 2023-10-19 DIAGNOSIS — H2513 Age-related nuclear cataract, bilateral: Secondary | ICD-10-CM | POA: Diagnosis not present

## 2023-10-19 DIAGNOSIS — H43813 Vitreous degeneration, bilateral: Secondary | ICD-10-CM | POA: Diagnosis not present

## 2023-10-19 DIAGNOSIS — E119 Type 2 diabetes mellitus without complications: Secondary | ICD-10-CM | POA: Diagnosis not present

## 2023-11-01 ENCOUNTER — Other Ambulatory Visit: Payer: Self-pay | Admitting: Family Medicine

## 2023-11-01 DIAGNOSIS — F419 Anxiety disorder, unspecified: Secondary | ICD-10-CM

## 2023-11-01 DIAGNOSIS — F3341 Major depressive disorder, recurrent, in partial remission: Secondary | ICD-10-CM

## 2023-11-01 DIAGNOSIS — Z8659 Personal history of other mental and behavioral disorders: Secondary | ICD-10-CM

## 2023-11-02 NOTE — Telephone Encounter (Signed)
 Requested medications are due for refill today.  yes  Requested medications are on the active medications list.  yes  Last refill. 08/07/2023 #30  Future visit scheduled.   no  Notes to clinic.  Pt already given a courtesy refill.    Requested Prescriptions  Pending Prescriptions Disp Refills   sertraline  (ZOLOFT ) 50 MG tablet [Pharmacy Med Name: SERTRALINE  50MG  TABLETS] 30 tablet 0    Sig: TAKE 1 TABLET BY MOUTH DAILY PATIENT NEEDS APPOINTMENT WITH NEW PROVIDER     Psychiatry:  Antidepressants - SSRI - sertraline  Failed - 11/02/2023  7:56 AM      Failed - AST in normal range and within 360 days    AST  Date Value Ref Range Status  11/08/2021 18 15 - 41 U/L Final         Failed - ALT in normal range and within 360 days    ALT  Date Value Ref Range Status  11/08/2021 22 0 - 44 U/L Final         Passed - Completed PHQ-2 or PHQ-9 in the last 360 days      Passed - Valid encounter within last 6 months    Recent Outpatient Visits           3 months ago Essential hypertension   [facility] Cathryne JAYSON, MD   6 months ago Acute non-recurrent maxillary sinusitis   [facility], Leita DEL, MD

## 2023-12-14 NOTE — Progress Notes (Addendum)
 Raymond [name]                                          MRN: [id number]   12/14/2023   The [facility] Quality Team Specialist reviewed this patient medical record for the purposes of chart review for care gap closure. The following were reviewed: abstraction for care gap closure-glycemic status assessment. Chart reviewed also for KED and EED.  03/03/2024- no new GSD to close 2025  03/11/2024- cannot close ked 2025   [facility] Quality Team

## 2023-12-17 DIAGNOSIS — E1142 Type 2 diabetes mellitus with diabetic polyneuropathy: Secondary | ICD-10-CM | POA: Diagnosis not present

## 2023-12-17 DIAGNOSIS — E1169 Type 2 diabetes mellitus with other specified complication: Secondary | ICD-10-CM | POA: Diagnosis not present

## 2023-12-17 DIAGNOSIS — I152 Hypertension secondary to endocrine disorders: Secondary | ICD-10-CM | POA: Diagnosis not present

## 2023-12-17 DIAGNOSIS — E1159 Type 2 diabetes mellitus with other circulatory complications: Secondary | ICD-10-CM | POA: Diagnosis not present

## 2024-01-02 DIAGNOSIS — L603 Nail dystrophy: Secondary | ICD-10-CM | POA: Diagnosis not present

## 2024-01-02 DIAGNOSIS — L601 Onycholysis: Secondary | ICD-10-CM | POA: Diagnosis not present

## 2024-01-02 DIAGNOSIS — E1142 Type 2 diabetes mellitus with diabetic polyneuropathy: Secondary | ICD-10-CM | POA: Diagnosis not present

## 2024-01-02 DIAGNOSIS — M79675 Pain in left toe(s): Secondary | ICD-10-CM | POA: Diagnosis not present

## 2024-01-02 DIAGNOSIS — B351 Tinea unguium: Secondary | ICD-10-CM | POA: Diagnosis not present

## 2024-01-02 DIAGNOSIS — M79674 Pain in right toe(s): Secondary | ICD-10-CM | POA: Diagnosis not present
